# Patient Record
Sex: Female | Born: 1935 | Race: White | Hispanic: No | Smoking: Never smoker
Health system: Southern US, Community
[De-identification: ages and names within clinical notes are randomized; demographics above are authoritative.]

## PROBLEM LIST (undated history)

## (undated) DIAGNOSIS — C439 Malignant melanoma of skin, unspecified: Secondary | ICD-10-CM

## (undated) DIAGNOSIS — K219 Gastro-esophageal reflux disease without esophagitis: Secondary | ICD-10-CM

## (undated) DIAGNOSIS — C50919 Malignant neoplasm of unspecified site of unspecified female breast: Secondary | ICD-10-CM

## (undated) DIAGNOSIS — I10 Essential (primary) hypertension: Secondary | ICD-10-CM

## (undated) DIAGNOSIS — E039 Hypothyroidism, unspecified: Secondary | ICD-10-CM

## (undated) HISTORY — PX: HIP SURGERY: SHX245

## (undated) HISTORY — PX: JOINT REPLACEMENT: SHX530

## (undated) HISTORY — PX: OTHER SURGICAL HISTORY: SHX169

## (undated) HISTORY — PX: EYE SURGERY: SHX253

---

## 2005-02-05 ENCOUNTER — Ambulatory Visit: Payer: Self-pay | Admitting: Family Medicine

## 2005-02-11 ENCOUNTER — Ambulatory Visit: Payer: Self-pay | Admitting: Family Medicine

## 2005-07-17 ENCOUNTER — Ambulatory Visit: Payer: Self-pay | Admitting: Family Medicine

## 2005-07-23 ENCOUNTER — Encounter: Admission: RE | Admit: 2005-07-23 | Discharge: 2005-07-23 | Payer: Self-pay | Admitting: Family Medicine

## 2005-07-24 ENCOUNTER — Ambulatory Visit: Payer: Self-pay | Admitting: Family Medicine

## 2006-02-22 ENCOUNTER — Ambulatory Visit: Payer: Self-pay | Admitting: Family Medicine

## 2006-03-01 ENCOUNTER — Ambulatory Visit: Payer: Self-pay | Admitting: Family Medicine

## 2006-03-02 ENCOUNTER — Ambulatory Visit: Payer: Self-pay | Admitting: Family Medicine

## 2006-03-15 ENCOUNTER — Encounter: Admission: RE | Admit: 2006-03-15 | Discharge: 2006-03-15 | Payer: Self-pay | Admitting: Orthopedic Surgery

## 2006-03-17 ENCOUNTER — Inpatient Hospital Stay (HOSPITAL_COMMUNITY): Admission: RE | Admit: 2006-03-17 | Discharge: 2006-03-20 | Payer: Self-pay | Admitting: Orthopedic Surgery

## 2006-06-20 ENCOUNTER — Inpatient Hospital Stay (HOSPITAL_COMMUNITY): Admission: EM | Admit: 2006-06-20 | Discharge: 2006-07-15 | Payer: Self-pay | Admitting: Emergency Medicine

## 2006-06-21 ENCOUNTER — Ambulatory Visit: Payer: Self-pay | Admitting: Internal Medicine

## 2006-06-23 ENCOUNTER — Ambulatory Visit: Payer: Self-pay | Admitting: Infectious Diseases

## 2006-07-02 ENCOUNTER — Encounter (INDEPENDENT_AMBULATORY_CARE_PROVIDER_SITE_OTHER): Payer: Self-pay | Admitting: *Deleted

## 2006-07-05 ENCOUNTER — Ambulatory Visit: Payer: Self-pay | Admitting: Gastroenterology

## 2006-07-27 ENCOUNTER — Ambulatory Visit (HOSPITAL_COMMUNITY): Admission: RE | Admit: 2006-07-27 | Discharge: 2006-07-27 | Payer: Self-pay | Admitting: Family Medicine

## 2006-07-30 ENCOUNTER — Ambulatory Visit: Payer: Self-pay | Admitting: Family Medicine

## 2006-07-30 LAB — CONVERTED CEMR LAB
BUN: 19 mg/dL (ref 6–23)
Eosinophils Relative: 1.9 % (ref 0.0–5.0)
Ferritin: 502.4 ng/mL — ABNORMAL HIGH (ref 10.0–291.0)
Glucose, Bld: 102 mg/dL — ABNORMAL HIGH (ref 70–99)
HCT: 33 % — ABNORMAL LOW (ref 36.0–46.0)
Hemoglobin: 11.2 g/dL — ABNORMAL LOW (ref 12.0–15.0)
Iron: 27 ug/dL — ABNORMAL LOW (ref 42–145)
Monocytes Absolute: 0.8 10*3/uL — ABNORMAL HIGH (ref 0.2–0.7)
Monocytes Relative: 6.4 % (ref 3.0–11.0)
Neutro Abs: 10.3 10*3/uL — ABNORMAL HIGH (ref 1.4–7.7)
Platelets: 567 10*3/uL — ABNORMAL HIGH (ref 150–400)
Potassium: 4.3 meq/L (ref 3.5–5.1)
RBC: 4.12 M/uL (ref 3.87–5.11)
RDW: 14.4 % (ref 11.5–14.6)
Transferrin: 198 mg/dL — ABNORMAL LOW (ref >=212.0)

## 2006-08-16 ENCOUNTER — Ambulatory Visit: Payer: Self-pay | Admitting: Internal Medicine

## 2006-08-23 ENCOUNTER — Encounter: Admission: RE | Admit: 2006-08-23 | Discharge: 2006-08-23 | Payer: Self-pay | Admitting: Internal Medicine

## 2006-09-23 ENCOUNTER — Encounter: Payer: Self-pay | Admitting: Infectious Diseases

## 2006-11-26 ENCOUNTER — Ambulatory Visit: Payer: Self-pay | Admitting: Infectious Diseases

## 2006-11-26 DIAGNOSIS — K750 Abscess of liver: Secondary | ICD-10-CM | POA: Insufficient documentation

## 2006-11-26 DIAGNOSIS — F411 Generalized anxiety disorder: Secondary | ICD-10-CM | POA: Insufficient documentation

## 2006-11-26 DIAGNOSIS — E785 Hyperlipidemia, unspecified: Secondary | ICD-10-CM | POA: Insufficient documentation

## 2006-11-26 DIAGNOSIS — M1990 Unspecified osteoarthritis, unspecified site: Secondary | ICD-10-CM | POA: Insufficient documentation

## 2006-11-26 DIAGNOSIS — I10 Essential (primary) hypertension: Secondary | ICD-10-CM | POA: Insufficient documentation

## 2010-10-17 NOTE — Op Note (Signed)
NAME:  Danielle, [name]               ACCOUNT NO.:  [id number]   MEDICAL RECORD NO.:  [id number]          PATIENT TYPE:  INP   LOCATION:  0006                         FACILITY:  [facility]   PHYSICIAN:  Ollen Gross, M.D.    DATE OF BIRTH:  1926-01-12   DATE OF PROCEDURE:  03/18/2007  DATE OF DISCHARGE:                                 OPERATIVE REPORT   PREOPERATIVE DIAGNOSIS:  Osteoarthritis left hip.   POSTOPERATIVE DIAGNOSIS:  Osteoarthritis left hip.   PROCEDURE:  Left total hip arthroplasty.   SURGEON:  Ollen Gross, M.D.   ASSISTANT:  Alexzandrew L. Julien Girt, P.A.   ANESTHESIA:  General.   ESTIMATED BLOOD LOSS:  300.   DRAINS:  Hemovac x1.   COMPLICATIONS:  None.  Condition stable to recovery.   CLINICAL NOTE:  Danielle [name] is a 77 year old female with severe end-stage  arthritis of the left hip with intractable pain.  She presents for left  total hip arthroplasty.   PROCEDURE IN DETAIL:  After the successful administration of general  anesthetic, the patient was placed in the right lateral decubitus position  with the left side up and held with the hip positioner.  The left lower  extremity was isolated from the peroneal with plastic drapes and prepped and  draped in the usual sterile fashion.  A short posterolateral incision was  made with the 10 blade through subcutaneous tissue, to the level of the  fascia lata -- which was incised in line with the skin incision.  The  sciatic nerve was palpated and protected, and the short external rotators  isolated off of the femur.  Capsulectomy was performed and the hip was  dislocated.  The center of the femoral head was marked and a trial  prosthesis placed at the center of the trial head; corresponds to the center  of native femoral head.  Osteotomy lines marked on the femoral neck and  osteotomy made with an oscillating saw.  Femoral head was then removed and  the femur retracted anteriorly to gain acetabular exposure.   Acetabular retractors were placed.  The patient had a huge circumferential  osteophyte which was removed.  Acetabular reaming began at 43 mm coursing  increments of 2-53 mm; and then a 54 mm Pinnacle acetabular shell was placed  in anatomic position with outstanding purchase.  We then placed the trial 54  mm neutral +4 liner.   On the femoral side, we prepared with the canal finder and irrigation.  Then  used a lateralizing reamer.  Then broached, starting at size 1 all the way  up to a size 3.  Given the large canal, I felt that if the size 3 fully  seated we would be able to get a size 4 Summit cemented stem in easily.  We  put the size 4 high offset neck for a trial off of the 3 broach.  A 32-plus  one was placed first.  This reduced easily, so went to a 32-plus 5; and she  had great soft tissue tension.  She also had great stability with full  extension, flexion  and rotation (70 degrees flexion, 40 degrees adduction,  90 degrees internal rotation, and 90 degrees of flexion and 70 degrees of  internal rotation).  By placing the left leg on top of the right, I felt  that the lengths were equal.  The hip was then dislocated and all trials  were removed.  The permanent apex hole eliminator was placed, and the  permanent 32 mm neutral Plus-4 Marathon liner was placed into the 54 mm  acetabular shell.  We then thoroughly irrigated the femoral canal with  pulsatile lavage, using the bottle brush.  While this was occurring, we were  mixing the cement.  I had previously placed a size 4 cement restricter at  the appropriate depth in the femoral canal.  Once the cement was ready for  implantation, we thoroughly dried the femoral canal and then injected the  cement at this canal and pressurized it.  The size 4 Summit cemented stem  was then placed at about 15-20 degrees of anteversion, matching her native  version.  All extruded cement was removed.  Once cement was fully hardened,  then the  permanent 32 plus 5 head was placed and the hip was reduced to the  same stability parameters.  The wound was copiously irrigated with saline  solution and short rotators reattached to the femur through drill holes.  Fascia lata was closed over a Hemovac drain with interrupted #1 Vicryl, and  then and subcutaneous  closed with #1 and #2-0 Vicryl; and subcuticular  running 4-0 Monocryl.  The incisions cleaned and dried and Steri-Strips  applied.  The drains hooked to suction.  A bulky sterile dressing placed.  Left lower extremity was then placed into a knee immobilizer, and she was  awakened and transferred to recovery in stable condition.      Ollen Gross, M.D.  Electronically Signed     FA/MEDQ  D:  03/17/2006  T:  03/18/2006  Job:  [id number]

## 2010-10-17 NOTE — Consult Note (Signed)
NAME:  Danielle [name], Danielle [name]               ACCOUNT NO.:  [id number]   MEDICAL RECORD NO.:  [id number]          PATIENT TYPE:  INP   LOCATION:  1621                         FACILITY:  [facility]   PHYSICIAN:  Angelia Mould. Derrell Lolling, M.D.DATE OF BIRTH:  09-10-1925   DATE OF CONSULTATION:  06/30/2006  DATE OF DISCHARGE:                                 CONSULTATION   REASON FOR CONSULTATION:  Evaluate multiple hepatic and perihepatic  abscesses.   HISTORY OF PRESENT ILLNESS:  This is a 77 year old white female who  underwent an uneventful hip replacement in October2007.  For the past 6  weeks she has been anorexic and for the past 2 weeks she has had nausea,  some vomiting, some fever, and change in bowel habits with nonbloody  diarrhea.  She came to the [facility] and was  admitted by Dr. Darryll Capers on January20,2007.  At that time, white  blood cell count was 29,000 with a left shift and imaging revealed liver  lesions that were thought to be consistent with abscesses.   The patient denies any history of gallbladder disease, diverticulitis,  Crohn's disease or other GI disorders.  Following her admission, she was  placed on IV antibiotics and has been followed by Dr. Darlina Sicilian of the  infectious disease service and Sandrea Hughs of the pulmonary service.  She has remained stable and actually is feeling better.   On January21,2008, she was taken to interventional radiology and they  placed a drain in a complex left lobe intrahepatic abscess and also  placed a drain in the right lateral subhepatic abscess.  She had a  subphrenic fluid collection that was hoped would communicate with the  right subhepatic space.  Cultures have grown Bacteroides fragilis and  Peptostreptococcus and she is on IV Zosyn which should cover both.   She states that she is feeling better.  Her fevers have resolved but she  still has some right upper quadrant pressure discomfort.   A follow-up CT scan  was performed on January29,2008, yesterday, and this  shows a adequate decompression of the drained areas in the left hepatic  lobe and in the right lateral subhepatic space, however, the right  subphrenic fluid collection remains very significant in size and  interventional radiology is reluctant to approach this for fear of  injury and contamination of the pleura or the colon.  I have discussed  this with Dr. Kearney Hard today.  He did not think this could be drained  percutaneously. On review of the CT scan, there is no apparent source of  the infection identified, specifically the gallbladder just looks  distended.  There is no small bowel or large bowel disease identified.  There is no pelvic abscess.  I was asked to see the patient to see if  surgical drainage was in order.   PAST HISTORY:  1. She had a total hip replacement.  2. She has hypertension.  3. She has hyperlipidemia.  4. Osteoporosis.  5. Osteoarthritis.  6. Vertigo.   MEDICATIONS ON ADMISSION:  Verapamil, aspirin, enalapril, Fosamax,  calcium, Lipitor, Maxzide.  DRUG ALLERGIES:  NONE KNOWN.   SOCIAL HISTORY:  She is a widow, nonsmoker.  No alcohol.   FAMILY HISTORY:  Her father died at age 10.   REVIEW OF SYSTEMS:  All systems reviewed.  They are noncontributory  except as listed above.   PHYSICAL EXAMINATION:  GENERAL APPEARANCE:  Pleasant older woman who is  alert, appears chronically ill, but not in any acute distress.  She is  alert and oriented, coherent.  She looks somewhat deconditioned.  VITAL SIGNS:  Temperature is 98.3, heart rate 80 and regular,  respiratory 20 and unlabored, oxygen saturation 91% on room, air blood  pressure 140/63.  NECK:  No adenopathy or mass.  No jugular venous distension.  LUNGS:  Clear to auscultation, somewhat decreased breath sounds at the  bases but no wheezes or rhonchi.  HEART:  Regular rate and rhythm.  ABDOMEN:  There is a drain in the epigastrium and a drain in the  right  flank draining small amounts of serosanguineous fluid.  The abdomen does  reveal a tender fullness in the right upper quadrant.  It is otherwise  soft without any masses.   LABORATORY DATA:  White blood cell count has gone down to 13,000 from a  high of 29,000, hemoglobin is 8.6 on June 28, 2006.  She has not had  a prothrombin time in 10 days, that will be repeated.  Her electrolytes  were normal on January28, 2008.  The last liver function tests were on  January22, 2008, and revealed a bilirubin of 1.7, alkaline phosphatase  142, these will be repeated.  She also had a right pleural effusion that  underwent right thoracentesis yesterday.   ASSESSMENT:  1. Left hepatic abscess, status post percutaneous drain.  2. Right subhepatic abscess, status post percutaneous drain.  3. Right subphrenic abscess, inadequately drained.  4. Bilateral pleural effusion, status post recent right pleural      thoracentesis.  5. Stable clinical course.   PLAN:  1. I had a long discussion with the patient and her daughter about      options for intervention.  2. Since her clinical course has stabilized and her white blood cell      count has come down, we will hold off on a surgical decision until      tomorrow.  We are going to check lab work and another CT scan      tomorrow.   If the right subphrenic fluid collection persists, and if radiology is  unable to drain this percutaneously, then she will likely need an  exploratory laparotomy and surgical drainage of these areas.  We would  also examine alimentary tract at that time to see if there is a source.  Certainly she may have to have her gallbladder removed.   Because of her deconditioned state, I advised starting hyperalimentation  and the patient and her daughter declined at this time, wanting to see  if she would eat better today.  I will follow-up tomorrow.     Angelia Mould. Derrell Lolling, M.D.  Electronically Signed     HMI/MEDQ   D:  06/30/2006  T:  06/30/2006  Job:  [id number]   cc:   Fransisco Hertz, M.D.  Fax: [phone]   Charlaine Dalton. Sherene Sires, MD, FCCP  [address]  [place] [address]

## 2010-10-17 NOTE — H&P (Signed)
NAME:  Danielle [name], Danielle [name]               ACCOUNT NO.:  [id number]   MEDICAL RECORD NO.:  [id number]          PATIENT TYPE:  INP   LOCATION:  1503                         FACILITY:  [facility]   PHYSICIAN:  Ollen Gross, M.D.    DATE OF BIRTH:  December 10, 1925   DATE OF ADMISSION:  03/17/2006  DATE OF DISCHARGE:                                HISTORY & PHYSICAL   Date of office visit, history and physical:  March 09, 2006.   CHIEF COMPLAINT:  Left hip pain.   HISTORY OF PRESENT ILLNESS:  The patient is a 77 year old female who has  been seen by Dr. Lequita Halt for a longstanding history of left hip pain.  Problems date back to about 20-30 years ago when she had a cyst collapse,  had to have bone-grafting.  She moved to [place] from [place] two years  ago and has had progressive problems over the past couple of years.  She is  seen in the office.  Her x-rays show severe end-stage arthritis with bone on  bone throughout the hip with some slight protrusio deformity.  It is felt  she has reached the point where she would benefit in undergoing hip  replacement, risks and benefits discussed.  The patient is subsequently  admitted to the hospital.   ALLERGIES:  No known drug allergies.   CURRENT MEDICATIONS:  1. Verapamil ER 240 mg.  2. Triamterine/hydrochlorothiazide 37.5/25 mg.  3. Enalapril 20 mg.  4. Aspirin 81 mg.  5. Fosamax weekly.  6. Lipitor 20 mg.  7. Vitamin B.   PAST MEDICAL HISTORY:  1. History of vertigo.  2. Hypercholesterolemia.  3. Hypertension.  4. History of motion sickness.  5. Osteoporosis.  6. Osteoarthritis.   PAST SURGICAL HISTORY:  Bone-graft, left hip.  Also varicose vein, left leg,  surgery.   SOCIAL HISTORY:  A widow.  Nonsmoker, no alcohol.  Three children.  Lives  alone.   FAMILY HISTORY:  Father deceased at age 45.  Hypertension runs on her  father's side.  Mother is deceased at age 67.   REVIEW OF SYSTEMS:  GENERAL:  No fevers, chills, or night  sweats.  NEUROLOGIC:  Does have a history of vertigo, which is seldom.  No seizure,  syncope or paralysis.  RESPIRATORY:  No shortness of breath, productive  cough or hemoptysis.  CARDIOVASCULAR:  No chest pain, angina or orthopnea.  GI:  Does have a sensitive stomach secondary to motion sickness.  No  nausea, vomiting, diarrhea or constipation.  GU:  No dysuria, hematuria or  discharge.  MUSCULOSKELETAL:  Left hip.   PHYSICAL EXAMINATION:  VITAL SIGNS:  Pulse 68, respirations 12, blood  pressure 122/48.  GENERAL:  A 77 year old white female, well-nourished, well-developed, in no  acute distress.  Very pleasant at the time of my exam.  She is a good  historian.  Alert and oriented and cooperative.  HEENT:  Normocephalic, atraumatic.  Pupils equal, round and reactive.  Oropharynx clear.  EOMs intact.  NECK:  Supple.  CHEST:  Clear anterior and posterior chest walls.  CARDIAC:  Regular rate and rhythm,  no murmurs.  ABDOMEN:  Soft, nontender, bowel sounds present.  RECTAL, BREASTS, GENITALIA:  Not done, not pertinent to present illness.  EXTREMITIES:  Left hip:  The left hip shows flexion to 90 degrees.  There is  0 internal rotation, 0 external rotation, about 10 degrees abduction,  antalgic gait.   IMPRESSION:  1. Osteoarthritis, left hip.  2. History of vertigo.  3. Hypercholesterolemia.  4. Hypertension.  5. History of motion sickness.  6. Osteoporosis.  7. Osteoarthritis.   PLAN:  The patient will be admitted to [facility] to undergo a  left total hip arthroplasty.  Surgery will be performed by Dr. Ollen Gross.      Alexzandrew L. Julien Girt, P.A.      Ollen Gross, M.D.  Electronically Signed    ALP/MEDQ  D:  03/16/2006  T:  03/18/2006  Job:  [id number]   cc:   Loreen Freud, M.D.  [address]  [place] [address]

## 2010-10-17 NOTE — Consult Note (Signed)
NAME:  Danielle [name], Danielle [name] NO.:  [id number]   MEDICAL RECORD NO.:  [id number]          PATIENT TYPE:  INP   LOCATION:  1621                         FACILITY:  [facility]   PHYSICIAN:  Antonietta Breach, M.D.  DATE OF BIRTH:  1925/10/14   DATE OF CONSULTATION:  07/05/2006  DATE OF DISCHARGE:  07/15/2006                                 CONSULTATION   REQUESTING PHYSICIAN:  Stacie Glaze, MD.   REASON FOR CONSULTATION:  Depression   HISTORY OF PRESENT ILLNESS:  Danielle [name] is a 77 year old female  admitted to the [facility] on June 20, 2006.   The patient was experiencing some initial depressed mood and was  shedding some tears regarding her stress.   The patient has had hepatic and perihepatic abscesses. She also in the  past 6 months has undergone hip replacement. After the hip replacement,  she developed abscesses. She has had decreased energy.   She denies any anhedonia or thoughts of hopelessness.  She denies  thoughts of harming herself.  She does maintain current interests. She  has no hallucinations or delusions. She was just started on Lexapro at  10 mg q.a.m., and she is not having any adverse side effects.  She has  normal orientation and memory ability.   The patient's onset of crying was approximately 3 days ago. She states  that she was facing a convergence of these multiple stresses and, after  expressing some mild despair, she is now feeling better.   PAST PSYCHIATRIC HISTORY:  No history of major depression.  No history  of mania.  No history of hallucinations or delusions.  The patient has  no history of psychotropic medication.   FAMILY PSYCHIATRIC HISTORY:  None known.   SOCIAL HISTORY:  The patient's husband died of cancer the age of 17.  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 Her son and daughter live  locally.  Therefore, she moved down to the area to live near them.  Her  religion is Air traffic [employer].  She does not use any  alcohol or illegal drugs.   PAST MEDICAL HISTORY:  1. Hypercholesterolemia.  2. Hypertension.  3. Motion sickness.  4. Osteoporosis.  5. Osteoarthritis.   MEDICATIONS:  The MAR is reviewed.  The patient is on Lexapro 10 mg  daily.   ALLERGIES:  No known drug allergies.   LABORATORY DATA:  WBC 8.1, hemoglobin 11.1, platelet count 480.  Comprehensive metabolic panel is unremarkable.  The BUN is 5, creatinine  0.73, SGOT 28, SGPT 30.  Folic acid within normal limits.  B12 is more  than adequate   REVIEW OF SYSTEMS:  CONSTITUTIONAL:  Afebrile.  No weight loss.  HEAD:  No trauma.  EYES:  No visual changes.  EARS:  No hearing impairment.  NOSE:  No rhinorrhea.  MOUTH/THROAT:  No sore throat.  NEUROLOGIC:  Unremarkable.  PSYCHIATRIC:  As above.  CARDIOVASCULAR:  No chest pain,  palpitations.  RESPIRATORY:  No coughing or wheezing.  GASTROINTESTINAL:  As above.  GENITOURINARY:  No dysuria.  SKIN:  Unremarkable.  MUSCULOSKELETAL:  No deformities.  HEMATOLOGIC/LYMPHATIC:  Unremarkable.  ENDOCRINE/METABOLIC:  Unremarkable.   PHYSICAL EXAMINATION:  VITAL SIGNS:  Temperature 97.5, pulse 107,  respiration 20, blood pressure 129/74, O2 saturation on 2 liters 96%.  GENERAL APPEARANCE:  Danielle [name] is an elderly female appearing her  chronologic age, lying in a supine position in her hospital bed with  good eye contact.  She has no abnormal involuntary movements.  She has a  normal habitus.   OTHER MENTAL STATUS EXAM:  Her attention span is within normal limits.  Her concentration is mildly decreased. Orientation is completely intact  to all spheres.  Memory is intact to immediate, recent, remote.  Her  speech involves normal rate and prosody without dysarthria.  Her fund of  knowledge and intelligence are within normal limits.  Thought process:  Logical, coherent, goal directed.  No looseness of associations.  Thought content:  No thoughts of harming herself, no thoughts of harming   others. No delusions, no hallucinations.  Her calculation and  abstraction ability are within normal limits.  Her affect is broad and  appropriate.  Her mood is within normal limits.  Her insight is within  normal limits.  Her judgment is intact.  She is socially appropriate and  cooperative.  She is well groomed   ASSESSMENT:  AXIS I:  (293.83) mood disorder not otherwise specified.  (General medical factors and some short-lived reactive depressive  symptoms now resolved.  The patient's low energy has been due to her  general medical problems.)  AXIS II:  None  AXIS III:  See general medical problems.  AXIS IV:  General medical.  AXIS V:  55.   Danielle [name] is not at risk to harm herself or others.  She agrees to  call emergency services immediately for any thoughts of harming herself,  any thoughts of harming others or distress.  She also agrees to call if  she develops symptoms of major depression.   The undersigned provided ego supportive psychotherapy and education. The  indications, alternatives and adverse effects of Lexapro were discussed  with the patient. She wanted to have the Lexapro stopped. She pointed  out the short duration of her symptoms and the fact that they were few  in number and moderate and have now resolved other than her low energy  due to the general medical condition. She does want the Lexapro stopped  but agrees to reconsider if if a major depressive symptom pattern  develops   RECOMMENDATIONS:  1. Will stop the Lexapro has requested by the patient given that her      symptom pattern is greatly improved as described above.  2. If the patient develops a major depressive pattern, please call      psychiatry.      Antonietta Breach, M.D.  Electronically Signed     JW/MEDQ  D:  12/06/2006  T:  12/06/2006  Job:  [id number]

## 2010-10-17 NOTE — Discharge Summary (Signed)
NAME:  Danielle [name], Danielle [name]               ACCOUNT NO.:  [id number]   MEDICAL RECORD NO.:  [id number]          PATIENT TYPE:  INP   LOCATION:  1503                         [facility]ACILITY[facility]  [facility]   PHYSICIAN:  Ollen Gross, M.D.    DATE OF BIRTH:  03-20-1926   DATE OF ADMISSION:  03/17/2006  DATE OF DISCHARGE:  03/20/2006                                 DISCHARGE SUMMARY   ADMISSION DIAGNOSES:  1. Osteoarthritis, left hip.  2. History of vertigo.  3. Hypercholesterolemia.  4. Hypertension.  5. History of motion sickness.  6. Osteoporosis secondary to osteoarthritis.   DISCHARGE DIAGNOSES:  1. Osteoarthritis, left hip, status post left total hip arthroplasty.  2. Acute blood loss anemia, did not require transfusion.  3. Postoperative hypokalemia, improved.   PROCEDURE:  On April 17, 2006, left total hip surgery, Dr. Lequita Halt,  assistant Alexzandrew L. Perkins, P.A.-C.  Anesthesia:  General.   CONSULTATIONS:  None.   BRIEF HISTORY:  Ms. Trevathan is a 77 year old female with severe end-stage  arthritis of the left hip, who now presents for a total hip arthroplasty.   LABORATORY DATA:  Preop CBC showed a hemoglobin of 12.2, hematocrit of 35.5,  white cell count 6.2.  Chemistry panel on an outpatient basis all within  normal limits.  PT/INR preop 12.1 and 1.0.  Hemoglobin did drop to 9.1, last  noted H&H 8.3 and 23.9.  Serial pro times were followed, last noted PT/INR  of 17.6, 1.4.  Serial BMETs were followed postop.  Potassium did drop down  to 3.2.  Potassium supplements, came back up to 3.5.  Preop UA:  Trace  leukocyte esterase, few epithelial cells, 0-2 white cells.  Blood group type  O positive.   EKG dated February 22, 2006:  Sinus rhythm, borderline first degree AV  block, confirmed, unable to read signature.  Two-view chest March 11, 2006:  Left upper lobe density which could be overlapping blood vessel.  Unless prior studies, would suggest CT scan.  Preop hip films:   Degenerative  changes, left hip.  CT scan dated March 15, 2006:  No evidence of left  upper lobe nodule.  Appearance was reduced by a summation shadow.   HOSPITAL COURSE:  The patient was admitted to [facility] and  tolerated the procedure well, later sent to the recovery room and the  orthopedic floor.  Started on PCA and p.o. pain control following surgery  actually doing very well after surgery but was sick on the morning of day  #1, had some vomiting treated symptomatically.  The Hemovac drain placed  from the surgery was pulled.  Hemoglobin was down a little bit.  Placed on  iron supplements.  By day #2 still having a little bit of intermittent  nausea but was improving.  Encouraged mobility.  Had a drop in potassium,  was given potassium supplements.  Nausea was getting a little bit better.  She actually did well with therapy, doing 50 and 80 feet with ambulation.  Dressing change.  Incision looked good.  She continued to progress well.  Nausea was resolving by  the following day of March 20, 2006, progressing  well, and was discharged home.   DISCHARGE PLAN:  The patient was discharged home on March 20, 2006.   DISCHARGE DIAGNOSES:  Please see above.   DISCHARGE MEDICATIONS:  Coumadin, Darvocet, Robaxin, Nu-Iron.   DIET:  As tolerated.   ACTIVITY:  Partial weightbearing 25-50% left lower extremity.  Home health  PT and home health nursing.  Total hip protocol.   FOLLOW-UP:  2 weeks.   DISPOSITION:  Home.   CONDITION ON DISCHARGE:  Improved.      Alexzandrew L. Julien Girt, P.A.      Ollen Gross, M.D.  Electronically Signed    ALP/MEDQ  D:  04/09/2006  T:  04/09/2006  Job:  [id number]   cc:   Loreen Freud, M.D.  [address] [place]  [place] [address]

## 2010-10-17 NOTE — Assessment & Plan Note (Signed)
[facility]                                 ON-CALL NOTE   Danielle, [name]                        MRN:          [id number]  DATE:07/17/2006                            DOB:          Dec 19, 1925    Caller is Danielle [name].  Phone number:  [phone].   SUBJECTIVE:  Ms. [name] states that her mother was in the hospital for 3-  1/2 weeks until she was discharged on Thursday.  She was supposed to  have home health care set up for deconditioning, decubitus ulcer care,  physical therapy, occupational therapy and skilled nursing evaluation.  She states that no one showed up to provide this care.  The care was  supposed to come from [facility]   ASSESSMENT AND PLAN:  I contacted the care manager at [facility], and she is contacting [facility] to reinstate any  orders that may have been inadvertently cancelled.  Of note, the  daughter stated that when she spoke the [place], the orders had been  cancelled.  I instructed the care manager to call me back if things are  unable to be set up, but hopefully we can get things set up by tomorrow.  I then returned a phone call to [facility] [name], [name], and informed her of the above plan.  She will call me if she  has any further problems.     Kerby Nora, MD  Electronically Signed    AB/MedQ  DD: 07/17/2006  DT: 07/17/2006  Job #: [id number]

## 2010-10-17 NOTE — Discharge Summary (Signed)
NAME:  Danielle [name], Danielle [name]               ACCOUNT NO.:  [id number]   MEDICAL RECORD NO.:  [id number]          PATIENT TYPE:  INP   LOCATION:  1621                         FACILITY:  [facility]   PHYSICIAN:  Rosalyn Gess. Norins, MD  DATE OF BIRTH:  1925-06-12   DATE OF ADMISSION:  06/20/2006  DATE OF DISCHARGE:  07/15/2006                               DISCHARGE SUMMARY   ADMITTING DIAGNOSES:  1. Abdominal pain with fever.  2. Hypertension.  3. Osteoarthritis.  4. Hyperlipidemia.   DISCHARGE DIAGNOSES:  1. 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  2. Hypertension.  3. Osteoarthritis.  4. Hyperlipidemia.  5. Anxiety.   CONSULTANTS:  1. Dr. Claud Kelp for general surgery.  2. Dr. [facility] for infectious disease.  3. Dr. Irish Lack for interventional radiology.   PROCEDURES:  1. CT of the abdomen June 24, 2006 showing increased low density      fluid in the right subphrenic and perihepatic space consistent with      abscess.  2. Chest x-ray June 24, 2006.  PICC line right atrium, bibasilar      atelectasis.  3. CT-guided abscess drainage June 25, 2006 by Dr. Fredia Sorrow with      placement of subhepatic brain, subphrenic drain.  4. Ultrasound-guided thoracentesis June 29, 2006 with 600 mL of      slightly turbid amber fluid.  5. CT abdomen and pelvis January 29 revealing decrease in infrahepatic      collection of abscess fluid with little change in the moderate      right subphrenic, collection some pelvic ascites was noted.  6. CT abdomen and pelvis January 31 revealing stable right subphrenic      abscess, marginal increase in anterior right hepatic abscess.      Increased bilateral pleural effusions, stable pelvic ascites.  7. CT-guided abscess drainage July 01, 2006 read as successful      subphrenic and subcapsular right liver abscess drainage by      aspiration with drain left in place.  8. CT abdomen and pelvis July 06, 2006 with bilateral effusions,      bibasilar atelectasis.  Drains were in place and stable for a total      of 3 drains at this point, complex cystic area in the liver      posteriorly without a drain was felt to be stable, the pelvis with      interval change.  9. CT abdomen February 10 showed improvement of level of all 3      indwelling cutaneous drains with less subphrenic fluid present in      that abscess.  10.CT abdomen July 14, 2006 showing decrease in size of residual      abscesses in the right lobe of liver, no new drains placed,      previous drains removed.   HISTORY OF PRESENT ILLNESS:  The patient is a 77 year old woman who had  had a viral illness diagnosed by her primary MD who presented to the  emergency department with fever and abdominal pain, mild nausea with  vomiting.  In the ER she was found have a white count of 29,900 with a  left shift and ultrasound revealed the patient to have liver lesions  that were consistent with abscess.  The patient's states she had a 1-  week history of severe abdominal discomfort with diarrhea, nausea and  vomiting x1.  The patient was subsequently admitted for treatment of  hepatic abscess.   PAST MEDICAL HISTORY:  Significant for total hip replacement,  hypertension, hyperlipidemia, osteoporosis, osteoarthritis, history of  vertigo.   MEDICATIONS ON ADMISSION:  1. Verapamil ER 240 mg daily.  2. Enalapril 20 mg daily.  3. Fosamax 70 mg weekly.  4. Calcium.  5. Lipitor 20 mg daily.  6. Maxzide 37.5/25.   The patient has no known drug allergies.   FAMILY HISTORY:  Noncontributory.   SOCIAL HISTORY:  The patient is a widow.  She is a nonsmoker and  nondrinker. She has a daughter who lives in town who works for the  hospital system.  She has had no out of area travel or exposure to  toxins.   HOSPITAL COURSE:  ID/liver abscess.  The patient was admitted with  multiple liver abscesses.  She was seen in consultation  by the ID  service.  She also had been seen by interventional radiology with drain  placements.  The patient was treated with Zosyn initially.  She was  followed with serial CT scans as noted.  Lab work was followed serially  as noted.  The process was very slow moving with slow resolution of her  infection. Cultures did grow out Peptostreptococcus growing from mixed  floral abscess with routine sensitivities to penicillin.  Because of her  prolonged stay and need for IV antibiotics, a PICC line was placed  without incident.  The patient continued to do well.  The final flora  was B fragilis and Peptostreptococcus which common liver abscess  organisms per Dr. Maurice March. The patient also underwent diagnostic  thoracentesis to be sure she did not develop an empyema and it was felt  actually she had a sympathetic effusion that did not require any  additional drainage or treatment.  She had no respiratory compromise  from her pleural effusion.   The patient continued to make a slow and steady recovery.  By the 13th,  final CT showed that there was no significant residual abscess requiring  any additional drainage.  ID had recommended that once the patient's  drains were removed and once she was felt to be stable that she would be  able to complete a home regimen with oral antibiotics.  Of note, the  patient had been changed from Zosyn to Primaxin but there had been no  significant change in the patient's condition.  With the patient having  improved with her drains being removed, with her fever remaining down,  with her white count remaining normal, with her pain having been  resolved, the patient was felt to be stable and ready for discharge on  oral antibiotics for an additional 10 days.   Of note, the patient had significant pain requiring management with IV  and oral narcotics but for the 2 days prior to discharge she was relying on Tylenol only once her drains were removed.   The  patient's other medical problems include mild anemia which is  chronic and will be treated with iron therapy.  Her blood pressure  remained stable.   DISCHARGE EXAMINATION:  VITAL SIGNS:  Temperature was 98.6, blood  pressure 133/66, pulse was 88, respirations 20, O2 sats 94%.  GENERAL APPEARANCE:  A pleasant woman who looks her stated age lying in  bed.  She is in no distress.  HEENT:  Exam was unremarkable.  CHEST:  The patient is moving air well with no increased work of  breathing.  No audible wheezes.  CARDIOVASCULAR:  2+ radial pulse, her precordium was quiet.  She had a  regular rate and rhythm.  No murmurs were appreciated.  ABDOMEN:  Bowel sounds were positive in all four quadrants.  The patient  had no guarding or rebound, no tenderness.  She has several bandages in  place over drain sites and these bandages were not removed.  The patient  had only minimal tenderness.  EXTREMITIES:  Without clubbing, cyanosis, no edema.  No deformities were  noted.  DERM:  The patient does have mild erythema in glabellar, infraorbital  and chin distribution suggestive of possible mild rosacea. No other skin  lesions were noted.   FINAL LABORATORY:  From the 13th hemoglobin was 10.9 grams, white count  was 7200, platelet count 280,000.  Chemistries with a sodium of 133,  potassium 3.8, chloride 100, CO2 of 26, BUN of 8, creatinine 0.42,  glucose was 103. INR was 1.0. The patient had a folic acid level checked  January 30 which was normal at 11.7.  B12 was checked January 30 and was  normal at 1076. Total iron was checked January 30 showing an iron of 12  with a percent iron saturation is 6%, total iron binding capacity was  187. Total fluid analysis revealed normal amylase, normal LDH, glucose  of 73, total protein less than 3, all consistent with benign effusion  with a cell count of 280 WBC.   Final micro abscess culture with Peptostreptococcus species on January  21, cultures on January  20 blood cultures were negative. Culture of  abscess fluid from February 1 was no growth.   DISCHARGE MEDICATIONS:  The patient will resume her home medications  including:  1. Maxzide 37.5/25 once daily.  2. Enalapril 5 mg daily.  3. Verapamil 240 mg daily.  4. Tylenol as needed for pain.  5. Colace 100 mg b.i.d. as needed.  6. I would continue the proton pump inhibitor with Protonix 40 mg      q.a.m. until seen in follow-up.  7. Potassium 20 mEq daily.  8. Levsin SL q.2 h p.r.n. any abdominal spasm.  9. Augmentin 875 mg b.i.d. for 10 additional days.  10.Ferrous gluconate 325 mg b.i.d. until seen in follow-up by her      primary physician.   DISPOSITION:  The patient is discharged home.  She should follow up with  Dr. Loreen Freud, her primary physician, in 7-10 days.   The patient is instructed for any recurrent fever, pain, discomfort, nausea, vomiting or other symptoms, she should call immediately for  reevaluation.   Would recommend the patient have a follow-up CT scan of the abdomen in  10-14 days.   The patient's condition at the time of discharge dictation is stable and  improved.      Rosalyn Gess Norins, MD  Electronically Signed     MEN/MEDQ  D:  07/15/2006  T:  07/15/2006  Job:  [id number]   cc:   Lelon Perla, DO  [address]  [place], [place] [address]   Fransisco Hertz, M.D.  Fax: [phone]   Jodi Marble. Fredia Sorrow, M.D.  Fax: [phone]

## 2012-01-17 ENCOUNTER — Other Ambulatory Visit: Payer: Self-pay | Admitting: Physician Assistant

## 2012-01-17 NOTE — Telephone Encounter (Signed)
Need paper chart  

## 2012-04-21 ENCOUNTER — Other Ambulatory Visit: Payer: Self-pay | Admitting: Physician Assistant

## 2012-04-22 NOTE — Telephone Encounter (Signed)
No paper chart °

## 2012-04-22 NOTE — Telephone Encounter (Signed)
Please pull paper chart.  

## 2013-07-30 DEATH — deceased

## 2015-02-22 ENCOUNTER — Encounter (HOSPITAL_BASED_OUTPATIENT_CLINIC_OR_DEPARTMENT_OTHER): Payer: Self-pay | Admitting: *Deleted

## 2015-02-22 ENCOUNTER — Emergency Department (HOSPITAL_BASED_OUTPATIENT_CLINIC_OR_DEPARTMENT_OTHER): Payer: Medicare HMO

## 2015-02-22 ENCOUNTER — Emergency Department (HOSPITAL_BASED_OUTPATIENT_CLINIC_OR_DEPARTMENT_OTHER)
Admission: EM | Admit: 2015-02-22 | Discharge: 2015-02-22 | Disposition: A | Discharge status: Home / Self Care | Payer: Medicare HMO | Attending: Emergency Medicine | Admitting: Emergency Medicine

## 2015-02-22 DIAGNOSIS — W07XXXA Fall from chair, initial encounter: Secondary | ICD-10-CM | POA: Diagnosis not present

## 2015-02-22 DIAGNOSIS — S4992XA Unspecified injury of left shoulder and upper arm, initial encounter: Secondary | ICD-10-CM | POA: Diagnosis present

## 2015-02-22 DIAGNOSIS — Y9289 Other specified places as the place of occurrence of the external cause: Secondary | ICD-10-CM | POA: Insufficient documentation

## 2015-02-22 DIAGNOSIS — Y9389 Activity, other specified: Secondary | ICD-10-CM | POA: Insufficient documentation

## 2015-02-22 DIAGNOSIS — S42292A Other displaced fracture of upper end of left humerus, initial encounter for closed fracture: Secondary | ICD-10-CM | POA: Insufficient documentation

## 2015-02-22 DIAGNOSIS — Y998 Other external cause status: Secondary | ICD-10-CM | POA: Diagnosis not present

## 2015-02-22 DIAGNOSIS — S42202A Unspecified fracture of upper end of left humerus, initial encounter for closed fracture: Secondary | ICD-10-CM

## 2015-02-22 MED ORDER — HYDROCODONE-ACETAMINOPHEN 5-325 MG PO TABS: 1.0000 | ORAL_TABLET | Freq: Four times a day (QID) | ORAL | Status: DC | PRN

## 2015-02-22 MED ORDER — ONDANSETRON 8 MG PO TBDP
8.0000 mg | ORAL_TABLET | Freq: Once | ORAL | Status: AC
  Administered 2015-02-22: 8 mg via ORAL
  Filled 2015-02-22: qty 1

## 2015-02-22 MED ORDER — LIDOCAINE HCL 2 % IJ SOLN: 10.0000 mL | Freq: Once | INTRAMUSCULAR | Status: DC

## 2015-02-22 MED ORDER — HYDROCODONE-ACETAMINOPHEN 5-325 MG PO TABS
1.0000 | ORAL_TABLET | Freq: Once | ORAL | Status: AC
  Administered 2015-02-22: 1 via ORAL
  Filled 2015-02-22: qty 1

## 2015-02-22 MED ORDER — ONDANSETRON 8 MG PO TBDP: 8.0000 mg | ORAL_TABLET | Freq: Three times a day (TID) | ORAL | Status: DC | PRN

## 2015-02-22 NOTE — ED Notes (Signed)
[address] off a chair while changing a light bulb. Injury to her left shoulder.

## 2015-02-22 NOTE — Discharge Instructions (Signed)
It was our pleasure to provide your ER care today - we hope that you feel better.  Wear sling/shoulder support.  Ice/coldpack to sore area.   You may want to sleep with head/upper body elevated to help with pain.   Take motrin or aleve as need for pain. You may also take hydrocodone as need for pain. No driving for the next 6 hours or when taking hydrocodone. Also, do not take tylenol or acetaminophen containing medication when taking hydrocodone.  You may take zofran as need for nausea.  If taking pain medication, and not as active as normal (due to arm injury), you can become constipated.  If constipated, make sure to drink plenty of fluids, get adequate fiber in diet, and take colace (stool softener), as need.  Follow up with orthopedist in coming week - call their office Monday to arrange appointment time.  Return to ER if worse, new symptoms, intractable pain, numbness/weakness, other concern.     Shoulder Fracture (Proximal Humerus or Glenoid) A shoulder fracture is a broken upper arm bone or a broken socket bone. The humerus is the upper arm bone and the glenoid is the shoulder socket. Proximal means the humerus is broken near the shoulder. Most of the time the bones of a broken shoulder are in an acceptable position. Usually, the injury can be treated with a shoulder immobilizer or sling and swath bandage. These devices support the arm and prevent any shoulder movement. If the bones are not in a good position, then surgery is sometimes needed. Shoulder fractures usually initially cause swelling, pain, and discoloration around the upper arm. They heal in 8 to 12 weeks with proper treatment. SYMPTOMS  At the time of injury:  Pain.  Tenderness.  Regular body contours are not normal. Later symptoms may include:  Swelling and bruising of the elbow and hand.  Swelling and bruising of the arm or chest. Other symptoms include:  Pain when lifting or turning the arm.  Paralysis  below the fracture.  Numbness or coldness below the fracture. CAUSES   Indirect force from falling on an outstretched arm.  A blow to the shoulder. RISK INCREASES WITH:  Not being in shape.  Playing contact sports, such as football, soccer, hockey, or rugby.  Sports where falling on an outstretched arm occurs, such as basketball, skateboarding, or volleyball.  History of bone or joint disease.  History of shoulder injury. PREVENTION  Warm up before activity.  Stretch before activity.  Stay in shape with your:  Heart fitness.  Flexibility.  Shoulder Strength.  Falling with the proper technique. PROGNOSIS  In adults, healing time is about 7 weeks. For children, healing time is about 5 weeks. Surgery may be needed. RELATED COMPLICATIONS  The bones do not heal together (nonunion).  The bones do not align properly when they heal (malunion).  Long-term problems with pain, stiffness, swelling, or loss of motion.  The injured arm heals shorter than the other.  Nerves are injured in the arm.  Arthritis in the shoulder.  Normal bone growth is interrupted in children.  Blood supply to the shoulder joint is diminished. TREATMENT If the bones are aligned, then initial treatment will be with ice and medicine to help with pain. The shoulder will be held in place with a sling (immobilization). The shoulder will be allowed to heal for up to 6 weeks. Injuries that may need surgery include:  Severe fractures.  Fractures that are not in appropriate alignment (displaced).  Non-displaced fractures (not  common). Surgery helps the bones align correctly. The bones may be held in place with:  Sutures.  Wires.  Rods.  Plates.  Screws.  Pins. If you have had surgery or not, you will likely be assisted by a physical therapist or athletic trainer to get the best results with your injured shoulder. This will likely include exercises to strengthen and stretch the injured and  surrounding areas. MEDICATION  If pain medicine is needed, nonsteroidal anti-inflammatory medicines (such as aspirin or ibuprofen) or other minor pain relievers (such as acetaminophen) are often advised.  Do not take pain medicine for 7 days before surgery.  Stronger pain relievers may be prescribed. Use only as directed and take only as much as you need. COLD THERAPY Cold treatment (icing) relieves pain and reduces inflammation. Cold treatment should be applied for 10 to 15 minutes every 2 to 3 hours, and immediately after activity that aggravates your symptoms. Use ice packs or an ice massage. SEEK IMMEDIATE MEDICAL CARE IF:  You have severe shoulder pain unrelieved by rest and taking pain medicine.  You have pain, numbness, tingling, or weakness in the hand or wrist.  You have shortness of breath, chest pain, severe weakness, or fainting.  You have severe pain with motion of the fingers or wrist.  Blue, gray, or dark color appears in the fingernails on injured extremity. Document Released: 05/18/2005 Document Revised: 08/10/2011 Document Reviewed: 08/30/2008 [facility] Patient Information 2015 [place], [place]. This information is not intended to replace advice given to you by your health care provider. Make sure you discuss any questions you have with your health care provider.   Shoulder Immobilizer Your doctor has given you a shoulder immobilizer. This can be used to treat shoulder fractures and dislocations. It keeps the arm supported next to the body, and prevents it from swinging loose and from further injury or pain. Shoulder fractures and dislocations usually take 4-6 weeks to heal. HOME CARE INSTRUCTIONS  To reduce irritation in your armpit, use powder or pads to absorb any sweat.  Your immobilizer may be removed and washed as directed, but do not use your arm for any work out of the immobilizer unless your doctor approves.  Always wear your immobilizer at night.  Call  your doctor if you have any questions about your injury or how to use this device. Document Released: 06/25/2004 Document Revised: 08/10/2011 Document Reviewed: 05/05/2007 [facility] Patient Information 2015 [place], [place]. This information is not intended to replace advice given to you by your health care provider. Make sure you discuss any questions you have with your health care provider.    Cryotherapy Cryotherapy means treatment with cold. Ice or gel packs can be used to reduce both pain and swelling. Ice is the most helpful within the first 24 to 48 hours after an injury or flare-up from overusing a muscle or joint. Sprains, strains, spasms, burning pain, shooting pain, and aches can all be eased with ice. Ice can also be used when recovering from surgery. Ice is effective, has very few side effects, and is safe for most people to use. PRECAUTIONS  Ice is not a safe treatment option for people with:  Raynaud phenomenon. This is a condition affecting small blood vessels in the extremities. Exposure to cold may cause your problems to return.  Cold hypersensitivity. There are many forms of cold hypersensitivity, including:  Cold urticaria. Red, itchy hives appear on the skin when the tissues begin to warm after being iced.  Cold erythema. This  is a red, itchy rash caused by exposure to cold.  Cold hemoglobinuria. Red blood cells break down when the tissues begin to warm after being iced. The hemoglobin that carry oxygen are passed into the urine because they cannot combine with blood proteins fast enough.  Numbness or altered sensitivity in the area being iced. If you have any of the following conditions, do not use ice until you have discussed cryotherapy with your caregiver:  Heart conditions, such as arrhythmia, angina, or chronic heart disease.  High blood pressure.  Healing wounds or open skin in the area being iced.  Current infections.  Rheumatoid arthritis.  Poor  circulation.  Diabetes. Ice slows the blood flow in the region it is applied. This is beneficial when trying to stop inflamed tissues from spreading irritating chemicals to surrounding tissues. However, if you expose your skin to cold temperatures for too long or without the proper protection, you can damage your skin or nerves. Watch for signs of skin damage due to cold. HOME CARE INSTRUCTIONS Follow these tips to use ice and cold packs safely.  Place a dry or damp towel between the ice and skin. A damp towel will cool the skin more quickly, so you may need to shorten the time that the ice is used.  For a more rapid response, add gentle compression to the ice.  Ice for no more than 10 to 20 minutes at a time. The bonier the area you are icing, the less time it will take to get the benefits of ice.  Check your skin after 5 minutes to make sure there are no signs of a poor response to cold or skin damage.  Rest 20 minutes or more between uses.  Once your skin is numb, you can end your treatment. You can test numbness by very lightly touching your skin. The touch should be so light that you do not see the skin dimple from the pressure of your fingertip. When using ice, most people will feel these normal sensations in this order: cold, burning, aching, and numbness.  Do not use ice on someone who cannot communicate their responses to pain, such as small children or people with dementia. HOW TO MAKE AN ICE PACK Ice packs are the most common way to use ice therapy. Other methods include ice massage, ice baths, and cryosprays. Muscle creams that cause a cold, tingly feeling do not offer the same benefits that ice offers and should not be used as a substitute unless recommended by your caregiver. To make an ice pack, do one of the following:  Place crushed ice or a bag of frozen vegetables in a sealable plastic bag. Squeeze out the excess air. Place this bag inside another plastic bag. Slide the bag  into a pillowcase or place a damp towel between your skin and the bag.  Mix 3 parts water with 1 part rubbing alcohol. Freeze the mixture in a sealable plastic bag. When you remove the mixture from the freezer, it will be slushy. Squeeze out the excess air. Place this bag inside another plastic bag. Slide the bag into a pillowcase or place a damp towel between your skin and the bag. SEEK MEDICAL CARE IF:  You develop white spots on your skin. This may give the skin a blotchy (mottled) appearance.  Your skin turns blue or pale.  Your skin becomes waxy or hard.  Your swelling gets worse. MAKE SURE YOU:   Understand these instructions.  Will watch your condition.  Will get help right away if you are not doing well or get worse. Document Released: 01/12/2011 Document Revised: 10/02/2013 Document Reviewed: 01/12/2011 [facility] Patient Information 2015 [place], [place]. This information is not intended to replace advice given to you by your health care provider. Make sure you discuss any questions you have with your health care provider.

## 2015-02-22 NOTE — ED Provider Notes (Signed)
CSN: [id number]     Arrival date & time 02/22/15  1718 History   First MD Initiated Contact with Patient 02/22/15 1722     Chief Complaint  Patient presents with  . Fall     (Consider location/radiation/quality/duration/timing/severity/associated sxs/prior Treatment) Patient is a 81 y.o. female presenting with fall. The history is provided by the patient and a relative.  Fall  Patient s/p injury to left shoulder just pta today.  Patient had climbed up on chair to change light bulb, was coming down at reach/moved awkwardly with left arm, and felt pop and sudden pain in left shoulder. Patient denies any fall.  No hx prior shoulder pain or injury. Pain constant, dull, mod-severe, worse w movement of shoulder. No arm or hand numbness or weakness. Skin intact. Denies any other pain or injury. States otherwise was in normal well state of health.      No past medical history on file. Past Surgical History  Procedure Laterality Date  . Hip surgery     No family history on file. Social History  Substance Use Topics  . Smoking status: Never Smoker   . Smokeless tobacco: None  . Alcohol Use: No   OB History    No data available     Review of Systems  Constitutional: Negative for fever.  Musculoskeletal: Negative for back pain and neck pain.  Skin: Negative for wound.  Neurological: Negative for weakness and numbness.      Allergies  Review of patient's allergies indicates no known allergies.  Home Medications   Prior to Admission medications   Medication Sig Start Date End Date Taking? Authorizing Provider  ENALAPRIL MALEATE PO Take by mouth.   Yes Historical Provider, MD  VERAPAMIL HCL PO Take by mouth.   Yes Historical Provider, MD   BP 166/58 mmHg  Pulse 75  Temp(Src) 98.2 F (36.8 C) (Oral)  Resp 18  Ht 5\' 4"  (1.626 m)  Wt 122 lb (55.339 kg)  BMI 20.93 kg/m2  SpO2 99% Physical Exam  Constitutional: She appears well-developed and well-nourished. No distress.   HENT:  Head: Atraumatic.  Eyes: Conjunctivae are normal. No scleral icterus.  Neck: Neck supple. No tracheal deviation present.  Cardiovascular: Intact distal pulses.   Pulmonary/Chest: Effort normal. No respiratory distress.  Abdominal: Normal appearance.  Musculoskeletal: She exhibits no edema.  Limited rom left shoulder due to pain. Mild sts left upper arm/shoulder. Good rom at wrist and elbow without pain. Skin intact. Radial pulse 2+. LUE r/m/n fxn, motor and sensory, intact.  c spine non tender, normal rom without pain.   Neurological: She is alert.  LUE, r/m/u fxn intact, motor 5/5, sens intact.   Skin: Skin is warm and dry. No rash noted. She is not diaphoretic.  Psychiatric: She has a normal mood and affect.  Nursing note and vitals reviewed.   ED Course  Procedures (including critical care time)  Dg Shoulder Left  02/22/2015   CLINICAL DATA:  The patient's arm popped while getting out of a chair today. Left upper arm pain. Initial encounter.  EXAM: LEFT SHOULDER - 2+ VIEW  COMPARISON:  None.  FINDINGS: The patient has an acute fracture of the proximal left humerus. The fracture is oblique in orientation extending from the lateral cortex just below the level of the surgical neck into the proximal diaphysis. The distal aspect of the fracture is distracted approximately 1 cm. No other acute abnormality is identified.  IMPRESSION: Acute proximal left humerus fracture as described.  Electronically Signed   By: Inge Rise M.D.   On: 02/22/2015 18:05        MDM   Xrays.   Zofran. Vicodin.   Ice. Shoulder immobilizer.  Discussed xrays w pt.  Recheck pt comfortable, and appears stable for d/c.  Family w pt, can assist.      Lajean Saver, MD 02/22/15 [phone]

## 2015-06-05 ENCOUNTER — Other Ambulatory Visit: Payer: Self-pay | Admitting: Surgery

## 2015-06-05 DIAGNOSIS — R2231 Localized swelling, mass and lump, right upper limb: Secondary | ICD-10-CM

## 2015-06-06 ENCOUNTER — Other Ambulatory Visit: Payer: Self-pay | Admitting: Surgery

## 2015-06-06 ENCOUNTER — Ambulatory Visit
Admission: RE | Admit: 2015-06-06 | Discharge: 2015-06-06 | Disposition: A | Discharge status: Home / Self Care | Payer: Medicare HMO | Source: Ambulatory Visit | Attending: Surgery | Admitting: Surgery

## 2015-06-06 DIAGNOSIS — R2231 Localized swelling, mass and lump, right upper limb: Secondary | ICD-10-CM

## 2015-06-06 DIAGNOSIS — N6320 Unspecified lump in the left breast, unspecified quadrant: Secondary | ICD-10-CM

## 2015-06-06 DIAGNOSIS — R2232 Localized swelling, mass and lump, left upper limb: Secondary | ICD-10-CM

## 2015-06-17 ENCOUNTER — Other Ambulatory Visit: Payer: Self-pay | Admitting: Oncology

## 2015-06-17 ENCOUNTER — Telehealth: Payer: Self-pay | Admitting: Oncology

## 2015-06-17 NOTE — Telephone Encounter (Signed)
SPOKE WITH PT REQUESTED THAT WE CONTACT DAUGHTER TO SCHEDULE ALL APPT. DAUGHTER CONFIRMED APPT FOR 1/30 AND MAILED OUT NEW PT PACKET

## 2015-06-17 NOTE — Telephone Encounter (Signed)
Lt mess regarding new pt referral.  °

## 2015-06-18 ENCOUNTER — Telehealth: Payer: Self-pay | Admitting: *Deleted

## 2015-06-18 NOTE — Telephone Encounter (Signed)
Mailed new pt packet to pt.  

## 2015-06-19 ENCOUNTER — Telehealth: Payer: Self-pay | Admitting: Oncology

## 2015-06-19 NOTE — Telephone Encounter (Signed)
Daugther called with questions regarding the process for scheduling appt for PET.  Pls call daughter to schedule PET.

## 2015-06-19 NOTE — Progress Notes (Signed)
[place]  [phone] [phone]     ID: Danielle [name] DOB: 12-28-1925  MR#: [id number]  YHC#:[id number]  Patient Care Team: Bartholome Bill, MD as PCP - General (Family Medicine) Armandina Gemma, MD as Consulting Physician (General Surgery) Chauncey Cruel, MD as Consulting Physician (Oncology) Allyn Kenner, MD (Dermatology) Gaynelle Arabian, MD as Consulting Physician (Orthopedic Surgery) PCP: Bartholome Bill, MD GYN: OTHER MD:  CHIEF COMPLAINT: Estrogen receptor positive breast cancer; stage IV malignant melanoma  CURRENT TREATMENT: Anastrozole   BREAST CANCER HISTORY: Danielle [name] she had an itchy spot in the middle of her back which she scratched sometimes, but this was not evaluated until [facility] fell off a chair while changing a light bulb and broke her left upper extremity. Because she couldn't change while in a sling, her daughter Danielle [name] had 2 give her a hand and in the process noted a black lesion in the middle of Danielle [name]'s back. She took married to Danielle. Juel [name] office where on 04/17/2015 he performed a shave biopsy of a pigmented lesion over the spine in the upper back. The pathology report ([id number]) showed a breast low depth of at least 6.5 mm, with the anatomic level at least 4. There were 23 mitoses per square millimeter and extensive ulceration. There was no satellitosis. There was no evidence of regression. They host response was not brisk. This was read as a pathology stage T4b and the patient was referred to Danielle [name] for wide excision.  However on exam Danielle. jerking noted bilateral axillary adenopathy and also a mass in the upper inner quadrant of the left breast. He set Danielle [name] up for bilateral mammography with tomography 06/06/2015, and this showed an irregular hyperdense mass in the left breast upper inner quadrant which was palpable. There was also a firm palpable mass in the left axilla as well as in the contralateral, right axilla. Ultrasonography  showed the left breast mass to measure 2.7 cm and at least 2 abnormally enlarged lymph nodes in the left axilla the larger one measuring 3.9 cm. In the right axilla there were also 2 abnormal appearing lymph nodes, the larger measuring 2.6 cm.  On 06/06/2015 Danielle [name] underwent biopsy of the left breast mass and both axillary adenopathy areas. The left breast mass proved to be an invasive ductal carcinoma , grade 2, estrogen and progesterone receptor positive, both at 100%, with strong staining intensity. HER-2 was not amplified, with a signals ratio of 1.42, the average copy number per cell being 2.55. The proliferation marker was 5%.  More importantly, both axillary lymph nodes showed metastatic melanoma. We were contacted and prior to today's visit we obtained a PET scan, which shows in addition to the left breast mass and the bilateral axillary lymph nodes (described as just deep to the latissimus dorsi muscles bilaterally) 2 hypermetabolic foci in the left lobe of the liver, measuring 1.3 and 0.8 cm, with an SUV max of 6.3. A 1.5 cm mass next to the left [name], clearly separate from the adrenal gland, is also hypermetabolic with an SUV max of 8.2. There were no lung or bone lesions.  Danielle [name] subsequent history is as detailed below   INTERVAL HISTORY: Danielle [name] was evaluated in the oncology clinic 07/01/2015 accompanied by her daughter Danielle [name]. Her case was also presented in the multidisciplinary breast cancer conference that same morning. At that time a preliminary plan was proposed: Consider left lumpectomy and bilateral axillary lymph node dissection if staging studies showed no evidence of metastatic melanoma  spread outside of the axillary lymph nodes.  REVIEW OF SYSTEMS: Danielle [name]'s left humeral fracture has healed moderately well, and she has not yet regained her full functional status. She use to go to the wife 5 days a week. She is now beginning to drive again. She gets tired easily. Her joint symptoms  particularly the knees and feet hurt sometimes. Her appetite is poor and she has lost a little bit of weight. She denies unusual headaches, visual changes, nausea, vomiting, 8 imbalance, cough, phlegm production, pleurisy, chest pain or pressure, palpitations, angina, or change in bowel or bladder habits. She's not aware of any other skin lesions of concern. A detailed review of systems today was otherwise stable  PAST MEDICAL HISTORY: No past medical history on file. Remote history of rectal prolapse with chronic constipation resulting, history of left humeral fracture, osteopenia  PAST SURGICAL HISTORY: Past Surgical History  Procedure Laterality Date  . Hip surgery      FAMILY HISTORY No family history on file. The patient's father died at the age of 9 in the setting of a call abuse. The patient's mother died from "old age" at 33. The patient had 4 brothers, no sisters. One brother died in his 45s from brain cancer. Another died from a stroke, and other from emphysema. The 52, youngest brother is 26 and jogs every day. There is no history of breast or ovarian cancer in the family. There is also no prior history of melanoma.  GYNECOLOGIC HISTORY:  No LMP recorded. Patient is postmenopausal. Menarche age 82, first live birth age 50, the patient is [name] P4. She stopped having periods in her early 80s. She did not use hormone replacement. She never took oral contraceptives.  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 She is widowed. She is now retired, and lives alone, with no pets. Her son Danielle [name] lives in [place], and is a retired Optometrist. Son Danielle [name] lives in [place] and is a Chief of Staff. Daughter Danielle [name] is a Marine scientist at [employer] locally. The fourth child, Danielle [name], died in an automobile accident at age 50. The patient has 4 grandchildren, no great grandchildren. She is a Corporate treasurer. She attends services  at [employer].    ADVANCED DIRECTIVES: Not yet notarized; Danielle [name] intends to name her daughter Danielle [name] as her healthcare power of attorney. She can be reached at [phone]   HEALTH MAINTENANCE: Social History  Substance Use Topics  . Smoking status: Never Smoker   . Smokeless tobacco: Not on file  . Alcohol Use: No     Colonoscopy: Remote  PAP:  Bone density: Remote  Lipid panel:  Allergies  Allergen Reactions  . Vicodin [Hydrocodone-Acetaminophen] Nausea Only    Current Outpatient Prescriptions  Medication Sig Dispense Refill  . aspirin EC 81 MG tablet Take by mouth.    . enalapril (VASOTEC) 20 MG tablet Take 20 mg by mouth 2 (two) times daily.     . Lutein 20 MG CAPS Take by mouth.    . Red Yeast Rice Extract (RED YEAST RICE PO) Take by mouth.    . triamterene-hydrochlorothiazide (DYAZIDE) 37.5-25 MG capsule Take 1 capsule by mouth daily.     . verapamil (CALAN-SR) 240 MG CR tablet   0  . ondansetron (ZOFRAN ODT) 8 MG disintegrating tablet Take 1 tablet (8 mg total) by mouth every 8 (eight) hours as needed for nausea or vomiting. (Patient not taking:  Reported on 07/01/2015) 15 tablet 0   No current facility-administered medications for this visit.    OBJECTIVE: Older white woman in no acute distress Filed Vitals:   07/01/15 1555  BP: 173/77  Pulse: 85  Temp: 97.8 F (36.6 C)  Resp: 19     Body mass index is 20.48 kg/(m^2).    ECOG FS:1 - Symptomatic but completely ambulatory  Ocular: Sclerae unicteric, pupils equal, round and reactive to light Ear-nose-throat: Oropharynx clear and moist Lymphatic: No cervical or supraclavicular adenopathy Lungs no rales or rhonchi, good excursion bilaterally Heart regular rate and rhythm, no murmur appreciated Abd soft, nontender, positive bowel sounds MSK no focal spinal tenderness, no joint edema Neuro: non-focal, well-oriented, appropriate affect Breasts: The right breast is unremarkable. In the right axilla there is a  palpable mass measuring approximately 2 cm. In the upper portion of the left breast there is an easily palpable mass measuring approximately 1 inch. It is not red or tender. It is easily movable. In the left axilla there is a palpable mass measuring approximately 3 cm. Skin: In the mid upper back over the spine there is a 1 x 1 cm pink irregular scar where the patient's original melanoma was shaved off  LAB RESULTS:  CMP     Component Value Date/Time   NA 134* 07/30/2006 1318   K 4.3 07/30/2006 1318   CL 100 07/30/2006 1318   CO2 24 07/30/2006 1318   GLUCOSE 102* 07/30/2006 1318   BUN 19 07/30/2006 1318   CREATININE 0.7 07/30/2006 1318   CALCIUM 9.4 07/30/2006 1318   GFRNONAA 86 07/30/2006 1318   GFRAA 104 07/30/2006 1318    INo results found for: SPEP, UPEP  Lab Results  Component Value Date   WBC 12.7* 07/30/2006   NEUTROABS 10.3* 07/30/2006   HGB 11.2* 07/30/2006   HCT 33.0* 07/30/2006   MCV 80.1 07/30/2006   PLT 567* 07/30/2006      Chemistry      Component Value Date/Time   NA 134* 07/30/2006 1318   K 4.3 07/30/2006 1318   CL 100 07/30/2006 1318   CO2 24 07/30/2006 1318   BUN 19 07/30/2006 1318   CREATININE 0.7 07/30/2006 1318      Component Value Date/Time   CALCIUM 9.4 07/30/2006 1318       No results found for: LABCA2  No components found for: [id number]  No results for input(s): INR in the last 168 hours.  Urinalysis No results found for: COLORURINE, APPEARANCEUR, LABSPEC, PHURINE, GLUCOSEU, HGBUR, BILIRUBINUR, KETONESUR, PROTEINUR, UROBILINOGEN, NITRITE, LEUKOCYTESUR  STUDIES: Nm Pet Image Initial (pi) Whole Body  07/01/2015  CLINICAL DATA:  Initial treatment strategy for malignant melanoma involving both axillary, and newly diagnosed breast cancer. EXAM: NUCLEAR MEDICINE PET WHOLE BODY TECHNIQUE: 5.91 mCi F-18 FDG was injected intravenously. CT data was obtained and used for attenuation correction and anatomic localization only. (This was not  acquired as a diagnostic CT examination.) Additional exam technical data entered on technologist worksheet. FASTING BLOOD GLUCOSE:  Value: 98 mg/dl COMPARISON:  CT of the abdomen and pelvis 08/23/2006. FINDINGS: Head/Neck: No hypermetabolic lymph nodes in the neck. Chest: Partially calcified hypermetabolic (SUVmax = 6.6) soft tissue lesion in the medial aspect of the left breast (image 111 of series 4) measuring 2.6 x 2.0 cm. Hypermetabolic soft tissue lesions are noted deep in the soft tissues of the axilla bilaterally, just deep to latissimus dorsi musculature. On the right the lesion measures 2.9 x 1.9 cm (SUVmax =  16.9), and on the left the lesion measures 2.5 x 1.5 cm (SUVmax = 11.5). There is also a hypermetabolic (SUVmax = 63.1) soft tissue mass in the lateral aspect of the left latissimus dorsi musculature adjacent to the insertion on the left humerus, where the mass measures 4.4 x 3.4 cm (image 80 of series 4). There is a healing fracture of the proximal third of the left humeral diaphysis with surrounding soft tissue prominence and hypermetabolism, which could be related to normal healing, or could indicate a pathologic fracture. No definite suspicious appearing pulmonary nodules or masses. Heart size is normal. No pericardial effusion. No hypermetabolic mediastinal, internal mammary or hilar lymphadenopathy. There is atherosclerosis of the thoracic aorta, the great vessels of the mediastinum and the coronary arteries, including calcified atherosclerotic plaque in the left main and left anterior descending coronary arteries. Severe calcifications of the mitral valve and mitral annulus. Moderate calcifications of the aortic valve. Abdomen/Pelvis: There are 2 foci of hypermetabolism in the left lobe of the liver, largest of which (SUVmax = 6.3) measures 8 x 13 mm (image 118 of series 4). No abnormal hypermetabolic activity within the pancreas, adrenal glands, or spleen. No hypermetabolic lymph nodes in the  abdomen or pelvis. In the medial aspect of the upper left retroperitoneum there is a soft tissue attenuation lesion measuring 1.5 x 1.1 cm (image 114 of series 4) which is hypermetabolic (SUVmax = 8.2). This lesion appears most intimately associated with the superior aspect of the left [name], and is clearly separate from the left adrenal gland. Calcifications in the central aspect of segment 8 of the liver have a benign appearance and demonstrate no associated hypermetabolism. Well-defined 2.6 x 3.6 cm hypo attenuating lesion in the superior aspect of segment 7 of the liver demonstrates no internal hypermetabolism, presumably a cyst. Small calcified gallstones lie dependently in the gallbladder. 3.1 cm low-attenuation lesion in the upper pole of the left [name] also demonstrates no internal hypermetabolism, likely a cyst. Extensive atherosclerosis throughout the abdominal and pelvic vasculature, without definite aneurysm. Severe colonic diverticulosis, without surrounding inflammatory changes to suggest an acute diverticulitis at this time. Skeleton: Postoperative changes of left hip arthroplasty. Healing fracture of the proximal third of the left humeral diaphysis with surrounding hypermetabolism, as discussed above. Old healed fracture of the proximal third of the right humeral diaphysis incidentally noted. Extremeties: In the distal aspect of the left femoral diaphysis there is a long segment of soft tissue attenuation replacing the marrow, best appreciated on images 256-270 of series 4. Within this region along the anterolateral aspect of the cortex (image 266 of series 4) there is a small focus of sclerosis, which demonstrates some low-level hypermetabolism (SUVmax = 2.7). IMPRESSION: 1. Two large soft tissue attenuation foci of hypermetabolism in the deep axillary regions bilaterally, compatible with the reported sites of melanoma involvement. 2. Large soft tissue mass centered in the lateral aspect of the  left latissimus dorsi musculature adjacent to the proximal third of the humeral diaphysis. This is presumably a metastatic lesion, either from breast cancer or melanoma. 3. 2.0 x 2.6 cm hypermetabolic lesion in the medial aspect of the left breast, corresponding to the patient's reported newly diagnosed breast cancer. 4. Two foci of hypermetabolism in the left lobe of the liver are presumably metastatic. In addition, there is a hypermetabolic focus in the distal third of the femoral diaphysis where there is extensive marrow replacement, concerning for an additional metastatic lesion. 5. Small exophytic soft tissue attenuation lesion demonstrates hypermetabolism  and appears to be associated with the upper pole of the left [name], concerning for potential primary renal neoplasm. This could be further evaluated with MRI of the abdomen with and without IV gadolinium if clinically appropriate. 6. Severe colonic diverticulosis without evidence of acute diverticulitis at this time. 7. Extensive atherosclerosis, including left main and 2 vessel coronary artery disease. 8. Cholelithiasis without evidence of acute cholecystitis at this time. 9. There are calcifications of the aortic valve and mitral valve/annulus. Echocardiographic correlation for evaluation of potential valvular dysfunction may be warranted if clinically indicated. Electronically Signed   By: Vinnie Langton M.D.   On: 07/01/2015 10:28   [place] Extrem Up Right Ltd  06/06/2015  CLINICAL DATA:  Left breast upper inner quadrant palpable mass felt by the patient 3 days ago. Clinically detected right axillary area of palpable concern. Recent diagnosis of left back melanoma. EXAM: DIGITAL DIAGNOSTIC BILATERAL MAMMOGRAM WITH 3D TOMOSYNTHESIS WITH CAD ULTRASOUND BILATERAL BREAST COMPARISON:  Previous exam(s). ACR Breast Density Category c: The breast tissue is heterogeneously dense, which may obscure small masses. FINDINGS: There are no suspicious masses, or  architectural distortion in the right breast. There is a loose group of punctate calcifications in the lower inner right breast with indeterminate appearance. It measures 1.6 x 1.2 x 3.2 cm. In the left breast upper inner quadrant, middle depth, there is a hyperdense irregular mass, which corresponds to the area of palpable concern. Mammographic images were processed with CAD. On physical exam, there is a firm palpable mass in the right axilla, and left 11 o'clock breast. Targeted ultrasound is performed, showing left breast 11 o'clock 4 cm from the nipple irregular hypoechoic mass with suspicious appearance measuring 2.7 x 1.6 x 2.7 cm. In the left axilla there are at least 2 abnormally enlarged lymph nodes with thickened cortex. The larger of the 2 measures 3.9 cm in long-axis with significant effacement of the hilum. Ultrasound evaluation of the right axilla demonstrates 2 abnormal appearing lymph nodes, the larger of which measures 2.1 by 1.7 by 2.6 cm. IMPRESSION: Left breast 11 o'clock suspicious mass measuring 2.7 cm. Bilateral axillary lymphadenopathy. Right breast lower inner quadrant cluster of indeterminate calcifications measuring 3.2 cm. in largest dimension. RECOMMENDATION: Ultrasound-guided core needle biopsy of the left 11 o'clock breast mass, and bilateral abnormal axillary lymph nodes. Further management of the indeterminate calcifications in the right breast should be based on the pathology results. Stereotactic core needle biopsy may be considered at a later date, if histologic diagnosis is important for clinical care. I have discussed the findings and recommendations with the patient. Results were also provided in writing at the conclusion of the visit. If applicable, a reminder letter will be sent to the patient regarding the next appointment. BI-RADS CATEGORY  4: Suspicious. Electronically Signed   By: Fidela Salisbury M.D.   On: 06/06/2015 12:13   Mm Digital Diagnostic Bilat  06/06/2015   CLINICAL DATA:  Status post biopsy of left breast 11 o'clock mass and bilateral axillary lymph nodes. EXAM: DIAGNOSTIC BILATERAL MAMMOGRAM POST ULTRASOUND BIOPSY COMPARISON:  Previous exam(s). FINDINGS: Mammographic images were obtained following ultrasound guided biopsy of left breast 11 o'clock mass and bilateral axillary lymph nodes. Mammographic images demonstrate ribbon shaped marker within a a solid mass in the left 11 o'clock breast, middle depth. The [facility] tissue markers placed in the bilateral axillary lymph nodes are not visualized due to their far posterior location. IMPRESSION: Successful placement of tissue marker within left 11 o'clock breast mass. Nonvisualization  of bilateral axillary lymph nodes tissue markers, due to their far posterior location. Final Assessment: Post Procedure Mammograms for Marker Placement Electronically Signed   By: Fidela Salisbury M.D.   On: 06/06/2015 14:42   US Breast Ltd Uni Left Inc Axilla  06/06/2015  CLINICAL DATA:  Left breast upper inner quadrant palpable mass felt by the patient 3 days ago. Clinically detected right axillary area of palpable concern. Recent diagnosis of left back melanoma. EXAM: DIGITAL DIAGNOSTIC BILATERAL MAMMOGRAM WITH 3D TOMOSYNTHESIS WITH CAD ULTRASOUND BILATERAL BREAST COMPARISON:  Previous exam(s). ACR Breast Density Category c: The breast tissue is heterogeneously dense, which may obscure small masses. FINDINGS: There are no suspicious masses, or architectural distortion in the right breast. There is a loose group of punctate calcifications in the lower inner right breast with indeterminate appearance. It measures 1.6 x 1.2 x 3.2 cm. In the left breast upper inner quadrant, middle depth, there is a hyperdense irregular mass, which corresponds to the area of palpable concern. Mammographic images were processed with CAD. On physical exam, there is a firm palpable mass in the right axilla, and left 11 o'clock breast. Targeted  ultrasound is performed, showing left breast 11 o'clock 4 cm from the nipple irregular hypoechoic mass with suspicious appearance measuring 2.7 x 1.6 x 2.7 cm. In the left axilla there are at least 2 abnormally enlarged lymph nodes with thickened cortex. The larger of the 2 measures 3.9 cm in long-axis with significant effacement of the hilum. Ultrasound evaluation of the right axilla demonstrates 2 abnormal appearing lymph nodes, the larger of which measures 2.1 by 1.7 by 2.6 cm. IMPRESSION: Left breast 11 o'clock suspicious mass measuring 2.7 cm. Bilateral axillary lymphadenopathy. Right breast lower inner quadrant cluster of indeterminate calcifications measuring 3.2 cm. in largest dimension. RECOMMENDATION: Ultrasound-guided core needle biopsy of the left 11 o'clock breast mass, and bilateral abnormal axillary lymph nodes. Further management of the indeterminate calcifications in the right breast should be based on the pathology results. Stereotactic core needle biopsy may be considered at a later date, if histologic diagnosis is important for clinical care. I have discussed the findings and recommendations with the patient. Results were also provided in writing at the conclusion of the visit. If applicable, a reminder letter will be sent to the patient regarding the next appointment. BI-RADS CATEGORY  4: Suspicious. Electronically Signed   By: Fidela Salisbury M.D.   On: 06/06/2015 12:13   Mm Diag Breast Tomo Bilateral  06/06/2015  CLINICAL DATA:  Left breast upper inner quadrant palpable mass felt by the patient 3 days ago. Clinically detected right axillary area of palpable concern. Recent diagnosis of left back melanoma. EXAM: DIGITAL DIAGNOSTIC BILATERAL MAMMOGRAM WITH 3D TOMOSYNTHESIS WITH CAD ULTRASOUND BILATERAL BREAST COMPARISON:  Previous exam(s). ACR Breast Density Category c: The breast tissue is heterogeneously dense, which may obscure small masses. FINDINGS: There are no suspicious masses, or  architectural distortion in the right breast. There is a loose group of punctate calcifications in the lower inner right breast with indeterminate appearance. It measures 1.6 x 1.2 x 3.2 cm. In the left breast upper inner quadrant, middle depth, there is a hyperdense irregular mass, which corresponds to the area of palpable concern. Mammographic images were processed with CAD. On physical exam, there is a firm palpable mass in the right axilla, and left 11 o'clock breast. Targeted ultrasound is performed, showing left breast 11 o'clock 4 cm from the nipple irregular hypoechoic mass with suspicious appearance measuring 2.7 x  1.6 x 2.7 cm. In the left axilla there are at least 2 abnormally enlarged lymph nodes with thickened cortex. The larger of the 2 measures 3.9 cm in long-axis with significant effacement of the hilum. Ultrasound evaluation of the right axilla demonstrates 2 abnormal appearing lymph nodes, the larger of which measures 2.1 by 1.7 by 2.6 cm. IMPRESSION: Left breast 11 o'clock suspicious mass measuring 2.7 cm. Bilateral axillary lymphadenopathy. Right breast lower inner quadrant cluster of indeterminate calcifications measuring 3.2 cm. in largest dimension. RECOMMENDATION: Ultrasound-guided core needle biopsy of the left 11 o'clock breast mass, and bilateral abnormal axillary lymph nodes. Further management of the indeterminate calcifications in the right breast should be based on the pathology results. Stereotactic core needle biopsy may be considered at a later date, if histologic diagnosis is important for clinical care. I have discussed the findings and recommendations with the patient. Results were also provided in writing at the conclusion of the visit. If applicable, a reminder letter will be sent to the patient regarding the next appointment. BI-RADS CATEGORY  4: Suspicious. Electronically Signed   By: Fidela Salisbury M.D.   On: 06/06/2015 12:13   [place] Lt Breast Bx W Loc Dev 1st Lesion Img  Bx Spec US Guide  06/07/2015  ADDENDUM REPORT: 06/07/2015 11:20 ADDENDUM: Pathology revealed grade II invasive ductal carcinoma in the left breast and metastatic melanoma in the right and left axillary lymph nodes. This was found to be concordant by Danielle. Fidela Salisbury. Pathology was discussed with the patient's daughter, Danielle [name] by telephone, at the patient's request. She reported that her mother had done well after the biopsies. Post biopsy instructions and care were reviewed and her questions were answered. Surgical consultation has been arranged with Danielle. Armandina Gemma at [facility] on June 20, 2015. My number was provided for additional questions and concerns. Pathology results reported by Susa Raring RN, BSN on June 07, 2015. Electronically Signed   By: Fidela Salisbury M.D.   On: 06/07/2015 11:20  06/07/2015  CLINICAL DATA:  Suspicious left breast 11 o'clock mass and bilateral axillary lymphadenopathy. EXAM: ULTRASOUND GUIDED LEFT BREAST CORE NEEDLE BIOPSY ULTRASOUND-GUIDED BILATERAL AXILLARY LYMPH NODE BIOPSY COMPARISON:  Previous exam(s). FINDINGS: I met with the patient and we discussed the procedure of ultrasound-guided biopsy, including benefits and alternatives. We discussed the high likelihood of a successful procedure. We discussed the risks of the procedure, including infection, bleeding, tissue injury, clip migration, and inadequate sampling. Informed written consent was given. The usual time-out protocol was performed immediately prior to the procedure. Initially the left breast 11 o'clock mass was sampled. Using sterile technique and 2% Lidocaine as local anesthetic, under direct ultrasound visualization, a 14 gauge spring-loaded device was used to perform biopsy of left breast 11 o'clock mass using a inferior approach. At the conclusion of the procedure a ribbon shaped tissue marker clip was deployed into the biopsy cavity. Next, an abnormal left axillary lymph  node was sampled in a similar fashion. Using sterile technique and 2% Lidocaine as local anesthetic, under direct ultrasound visualization, a 14 gauge spring-loaded device was used to perform biopsy of left axillary lymph node using a lateral approach. At the conclusion of the procedure a HydroMARK tissue marker clip was deployed into the biopsy cavity. Next, an abnormal right axillary lymph node was sampled in a similar fashion. Using sterile technique and 2% Lidocaine as local anesthetic, under direct ultrasound visualization, a 14 gauge spring-loaded device was used to perform biopsy of right axillary  lymph node using a medial approach. At the conclusion of the procedure a HydroMARK tissue marker clip was deployed into the biopsy cavity. Follow up 2 view mammogram was performed and dictated separately. IMPRESSION: Ultrasound guided biopsy of left breast 11 o'clock mass and bilateral axillary lymph nodes. No apparent complications. Electronically Signed: By: Fidela Salisbury M.D. 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 This was found to be concordant by Danielle. Fidela Salisbury. Pathology was discussed with the patient's daughter, Danielle [name] by telephone, at the patient's request. She reported that her mother had done well after the biopsies. Post biopsy instructions and care were reviewed and her questions were answered. Surgical consultation has been arranged with Danielle. Armandina Gemma at [facility] on June 20, 2015. My number was provided for additional questions and concerns. Pathology results reported by Susa Raring RN, BSN on June 07, 2015. Electronically Signed   By: Fidela Salisbury M.D.   On: 06/07/2015 11:20  06/07/2015  CLINICAL DATA:   Suspicious left breast 11 o'clock mass and bilateral axillary lymphadenopathy. EXAM: ULTRASOUND GUIDED LEFT BREAST CORE NEEDLE BIOPSY ULTRASOUND-GUIDED BILATERAL AXILLARY LYMPH NODE BIOPSY COMPARISON:  Previous exam(s). FINDINGS: I met with the patient and we discussed the procedure of ultrasound-guided biopsy, including benefits and alternatives. We discussed the high likelihood of a successful procedure. We discussed the risks of the procedure, including infection, bleeding, tissue injury, clip migration, and inadequate sampling. Informed written consent was given. The usual time-out protocol was performed immediately prior to the procedure. Initially the left breast 11 o'clock mass was sampled. Using sterile technique and 2% Lidocaine as local anesthetic, under direct ultrasound visualization, a 14 gauge spring-loaded device was used to perform biopsy of left breast 11 o'clock mass using a inferior approach. At the conclusion of the procedure a ribbon shaped tissue marker clip was deployed into the biopsy cavity. Next, an abnormal left axillary lymph node was sampled in a similar fashion. Using sterile technique and 2% Lidocaine as local anesthetic, under direct ultrasound visualization, a 14 gauge spring-loaded device was used to perform biopsy of left axillary lymph node using a lateral approach. At the conclusion of the procedure a HydroMARK tissue marker clip was deployed into the biopsy cavity. Next, an abnormal right axillary lymph node was sampled in a similar fashion. Using sterile technique and 2% Lidocaine as local anesthetic, under direct ultrasound visualization, a 14 gauge spring-loaded device was used to perform biopsy of right axillary lymph node using a medial approach. At the conclusion of the procedure a HydroMARK tissue marker clip was deployed into the biopsy cavity. Follow up 2 view mammogram was performed and dictated separately. IMPRESSION: Ultrasound guided biopsy of left breast 11 o'clock  mass and bilateral axillary lymph nodes. No apparent complications. Electronically Signed: By: Fidela Salisbury M.D. 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 This was found to be concordant by Danielle. Fidela Salisbury. Pathology was discussed with the patient's daughter, Danielle [name] by telephone, at the patient's request. She reported that her  mother had done well after the biopsies. Post biopsy instructions and care were reviewed and her questions were answered. Surgical consultation has been arranged with Danielle. Armandina Gemma at [facility] on June 20, 2015. My number was provided for additional questions and concerns. Pathology results reported by Susa Raring RN, BSN on June 07, 2015. Electronically Signed   By: Fidela Salisbury M.D.   On: 06/07/2015 11:20  06/07/2015  CLINICAL DATA:  Suspicious left breast 11 o'clock mass and bilateral axillary lymphadenopathy. EXAM: ULTRASOUND GUIDED LEFT BREAST CORE NEEDLE BIOPSY ULTRASOUND-GUIDED BILATERAL AXILLARY LYMPH NODE BIOPSY COMPARISON:  Previous exam(s). FINDINGS: I met with the patient and we discussed the procedure of ultrasound-guided biopsy, including benefits and alternatives. We discussed the high likelihood of a successful procedure. We discussed the risks of the procedure, including infection, bleeding, tissue injury, clip migration, and inadequate sampling. Informed written consent was given. The usual time-out protocol was performed immediately prior to the procedure. Initially the left breast 11 o'clock mass was sampled. Using sterile technique and 2% Lidocaine as local anesthetic, under direct ultrasound visualization, a 14 gauge spring-loaded device was used to perform biopsy of left breast 11  o'clock mass using a inferior approach. At the conclusion of the procedure a ribbon shaped tissue marker clip was deployed into the biopsy cavity. Next, an abnormal left axillary lymph node was sampled in a similar fashion. Using sterile technique and 2% Lidocaine as local anesthetic, under direct ultrasound visualization, a 14 gauge spring-loaded device was used to perform biopsy of left axillary lymph node using a lateral approach. At the conclusion of the procedure a HydroMARK tissue marker clip was deployed into the biopsy cavity. Next, an abnormal right axillary lymph node was sampled in a similar fashion. Using sterile technique and 2% Lidocaine as local anesthetic, under direct ultrasound visualization, a 14 gauge spring-loaded device was used to perform biopsy of right axillary lymph node using a medial approach. At the conclusion of the procedure a HydroMARK tissue marker clip was deployed into the biopsy cavity. Follow up 2 view mammogram was performed and dictated separately. IMPRESSION: Ultrasound guided biopsy of left breast 11 o'clock mass and bilateral axillary lymph nodes. No apparent complications. Electronically Signed: By: Fidela Salisbury M.D. On: 06/06/2015 14:40    ASSESSMENT: 82 y.o. [place] woman with concurrent breast cancer and melanoma  BREAST CANCER: (1) left breast upper inner quadrant biopsy 06/06/2015 shows a clinical T2 NX, stage II invasive ductal carcinoma , grade 2, estrogen receptor and progesterone receptor strongly positive, with an MIB-1 of 5%, and no HER-2 amplification, the signals ratio being 1.42 and the number per cell 2.55.   (2) anastrozole started 07/01/2015  MALIGNANT MELANOMA: (a) shave biopsy from the mid upper back 04/17/2015 shows invasive malignant melanoma, Breslow depth 6.5 mm plus, Clark's level 4+, with 23 mitoses per square millimeter, extensive ulceration,, with involved edges, and non-brisk host response  (b) biopsy of right and left  axillary lymph nodes 06/06/2015 showed metastatic melanoma, mutational status pending   PLAN: Zoe's situation obviously is complex and it is best to separate her cancers in terms of treatment options and prognosis.  As far as her breast cancer is concerned, she appears to have a stage II invasive ductal carcinoma which is strongly estrogen and progesterone receptor positive and HER-2 not amplified. At some point we may want to proceed to left lumpectomy but before doing that it would be good to know what her overall prognosis is as far as  the melanoma is concerned. For that reason we are starting with systemic treatment for the breast cancer and that means anastrozole.  Today we discussed the possible toxicities, side effects and complications of anastrozole and I went ahead and put in the prescription for her. At some point later this year we may want to repeat a left breast ultrasound to confirm response. At some point in the future a left lumpectomy can be performed for local control.  She understands that the malignant melanoma is the more dangerous tumor in her situation. It is not curable. Treatment options however have recently exploded and the real question here is how well will she be able to tolerate aggressive therapy. I have requested a BRAF mutation analysis and if her myeloma is positive we will likely treat her with dabrafenib and TRAMETINIB. If the tumor is BRAF wild-type we will likely go with pembrolizumab.  Today I gave her information on the possible side effects, toxicities and complications of these 3 agents. We should know what our options are within the next 10 days and I will see her again at that time so we can make a definitive choice. In the meantime we're going to complete her staging with an echocardiogram, brain MRI, and bone density scan.  Demitria has a good understanding of the overall plan. She agrees with it. She will call with any problems that may develop before her  next visit here.  Chauncey Cruel, MD   07/01/2015 5:04 PM Medical Oncology and Hematology [facility] [address] [place], [place] [address] Tel. [phone]    Fax. [phone]

## 2015-06-24 ENCOUNTER — Telehealth: Payer: Self-pay | Admitting: *Deleted

## 2015-06-24 ENCOUNTER — Other Ambulatory Visit: Payer: Self-pay | Admitting: *Deleted

## 2015-06-24 NOTE — Telephone Encounter (Signed)
This RN contacted [employer] and obtained new authorization for currently scheduled PET- whole body due to known melanoma and breast cancer diagnosis.  Authorization number given as [id number].

## 2015-06-25 ENCOUNTER — Encounter (HOSPITAL_COMMUNITY): Admission: RE | Admit: 2015-06-25 | Payer: Medicare HMO | Source: Ambulatory Visit

## 2015-06-28 ENCOUNTER — Other Ambulatory Visit: Payer: Self-pay | Admitting: *Deleted

## 2015-06-28 DIAGNOSIS — C50919 Malignant neoplasm of unspecified site of unspecified female breast: Secondary | ICD-10-CM

## 2015-06-28 DIAGNOSIS — C439 Malignant melanoma of skin, unspecified: Secondary | ICD-10-CM

## 2015-07-01 ENCOUNTER — Ambulatory Visit (HOSPITAL_COMMUNITY)
Admission: RE | Admit: 2015-07-01 | Discharge: 2015-07-01 | Disposition: A | Discharge status: Home / Self Care | Payer: Medicare HMO | Source: Ambulatory Visit | Attending: Oncology | Admitting: Oncology

## 2015-07-01 ENCOUNTER — Ambulatory Visit (HOSPITAL_BASED_OUTPATIENT_CLINIC_OR_DEPARTMENT_OTHER): Payer: Medicare HMO | Admitting: Oncology

## 2015-07-01 ENCOUNTER — Other Ambulatory Visit (HOSPITAL_BASED_OUTPATIENT_CLINIC_OR_DEPARTMENT_OTHER): Payer: Medicare HMO

## 2015-07-01 VITALS — BP 173/77 | HR 85 | Temp 97.8°F | Resp 19 | Wt 119.4 lb

## 2015-07-01 DIAGNOSIS — M8580 Other specified disorders of bone density and structure, unspecified site: Secondary | ICD-10-CM

## 2015-07-01 DIAGNOSIS — K8020 Calculus of gallbladder without cholecystitis without obstruction: Secondary | ICD-10-CM | POA: Insufficient documentation

## 2015-07-01 DIAGNOSIS — N6489 Other specified disorders of breast: Secondary | ICD-10-CM | POA: Insufficient documentation

## 2015-07-01 DIAGNOSIS — C50212 Malignant neoplasm of upper-inner quadrant of left female breast: Secondary | ICD-10-CM

## 2015-07-01 DIAGNOSIS — K5730 Diverticulosis of large intestine without perforation or abscess without bleeding: Secondary | ICD-10-CM | POA: Insufficient documentation

## 2015-07-01 DIAGNOSIS — Z170 Estrogen receptor positive status [ER+]: Secondary | ICD-10-CM

## 2015-07-01 DIAGNOSIS — C439 Malignant melanoma of skin, unspecified: Secondary | ICD-10-CM | POA: Insufficient documentation

## 2015-07-01 DIAGNOSIS — C773 Secondary and unspecified malignant neoplasm of axilla and upper limb lymph nodes: Secondary | ICD-10-CM | POA: Diagnosis not present

## 2015-07-01 DIAGNOSIS — I700 Atherosclerosis of aorta: Secondary | ICD-10-CM

## 2015-07-01 DIAGNOSIS — I2510 Atherosclerotic heart disease of native coronary artery without angina pectoris: Secondary | ICD-10-CM | POA: Insufficient documentation

## 2015-07-01 DIAGNOSIS — C4359 Malignant melanoma of other part of trunk: Secondary | ICD-10-CM

## 2015-07-01 DIAGNOSIS — C787 Secondary malignant neoplasm of liver and intrahepatic bile duct: Secondary | ICD-10-CM | POA: Insufficient documentation

## 2015-07-01 DIAGNOSIS — C50019 Malignant neoplasm of nipple and areola, unspecified female breast: Secondary | ICD-10-CM

## 2015-07-01 DIAGNOSIS — C438 Malignant melanoma of overlapping sites of skin: Secondary | ICD-10-CM

## 2015-07-01 LAB — GLUCOSE, CAPILLARY: Glucose-Capillary: 98 mg/dL (ref 65–99)

## 2015-07-01 MED ORDER — FLUDEOXYGLUCOSE F - 18 (FDG) INJECTION
5.9100 | Freq: Once | INTRAVENOUS | Status: AC | PRN
  Administered 2015-07-01: 5.91 via INTRAVENOUS

## 2015-07-01 MED ORDER — ANASTROZOLE 1 MG PO TABS: 1.0000 mg | ORAL_TABLET | Freq: Every day | ORAL | Status: DC

## 2015-07-02 ENCOUNTER — Telehealth: Payer: Self-pay | Admitting: Oncology

## 2015-07-02 NOTE — Telephone Encounter (Signed)
Called Patient's daughter and notified her of Patient's upcoming Scheduled Appointment. Also called Benedetto Goad for authorization of the Echocardiogram.     [facility].

## 2015-07-05 ENCOUNTER — Encounter (HOSPITAL_COMMUNITY): Payer: Self-pay

## 2015-07-10 ENCOUNTER — Other Ambulatory Visit: Payer: Self-pay | Admitting: Oncology

## 2015-07-12 ENCOUNTER — Telehealth: Payer: Self-pay | Admitting: Oncology

## 2015-07-12 ENCOUNTER — Other Ambulatory Visit (HOSPITAL_BASED_OUTPATIENT_CLINIC_OR_DEPARTMENT_OTHER): Payer: Medicare HMO

## 2015-07-12 ENCOUNTER — Ambulatory Visit (HOSPITAL_BASED_OUTPATIENT_CLINIC_OR_DEPARTMENT_OTHER): Payer: Medicare HMO | Admitting: Oncology

## 2015-07-12 ENCOUNTER — Other Ambulatory Visit: Payer: Self-pay | Admitting: Oncology

## 2015-07-12 ENCOUNTER — Encounter (HOSPITAL_COMMUNITY): Payer: Self-pay

## 2015-07-12 ENCOUNTER — Ambulatory Visit (HOSPITAL_COMMUNITY)
Admission: RE | Admit: 2015-07-12 | Discharge: 2015-07-12 | Disposition: A | Discharge status: Home / Self Care | Payer: Medicare HMO | Source: Ambulatory Visit | Attending: Oncology | Admitting: Oncology

## 2015-07-12 VITALS — BP 163/65 | HR 72 | Temp 97.8°F | Resp 18 | Ht 64.0 in | Wt 119.1 lb

## 2015-07-12 DIAGNOSIS — C50919 Malignant neoplasm of unspecified site of unspecified female breast: Secondary | ICD-10-CM

## 2015-07-12 DIAGNOSIS — C50212 Malignant neoplasm of upper-inner quadrant of left female breast: Secondary | ICD-10-CM

## 2015-07-12 DIAGNOSIS — C4359 Malignant melanoma of other part of trunk: Secondary | ICD-10-CM | POA: Diagnosis not present

## 2015-07-12 DIAGNOSIS — Z170 Estrogen receptor positive status [ER+]: Secondary | ICD-10-CM

## 2015-07-12 DIAGNOSIS — I340 Nonrheumatic mitral (valve) insufficiency: Secondary | ICD-10-CM | POA: Insufficient documentation

## 2015-07-12 DIAGNOSIS — C438 Malignant melanoma of overlapping sites of skin: Secondary | ICD-10-CM

## 2015-07-12 DIAGNOSIS — C773 Secondary and unspecified malignant neoplasm of axilla and upper limb lymph nodes: Secondary | ICD-10-CM | POA: Diagnosis not present

## 2015-07-12 DIAGNOSIS — I371 Nonrheumatic pulmonary valve insufficiency: Secondary | ICD-10-CM | POA: Insufficient documentation

## 2015-07-12 DIAGNOSIS — Z79811 Long term (current) use of aromatase inhibitors: Secondary | ICD-10-CM

## 2015-07-12 DIAGNOSIS — I10 Essential (primary) hypertension: Secondary | ICD-10-CM

## 2015-07-12 DIAGNOSIS — C50019 Malignant neoplasm of nipple and areola, unspecified female breast: Secondary | ICD-10-CM

## 2015-07-12 DIAGNOSIS — E785 Hyperlipidemia, unspecified: Secondary | ICD-10-CM | POA: Insufficient documentation

## 2015-07-12 DIAGNOSIS — C439 Malignant melanoma of skin, unspecified: Secondary | ICD-10-CM

## 2015-07-12 DIAGNOSIS — I700 Atherosclerosis of aorta: Secondary | ICD-10-CM

## 2015-07-12 LAB — CBC & DIFF AND RETIC
BASO%: 0.4 % (ref 0.0–2.0)
BASOS ABS: 0 10*3/uL (ref 0.0–0.1)
EOS%: 0.8 % (ref 0.0–7.0)
Eosinophils Absolute: 0.1 10*3/uL (ref 0.0–0.5)
HEMATOCRIT: 37.9 % (ref 34.8–46.6)
HEMOGLOBIN: 12.5 g/dL (ref 11.6–15.9)
IMMATURE RETIC FRACT: 1.4 % — AB (ref 1.60–10.00)
LYMPH#: 0.8 10*3/uL — AB (ref 0.9–3.3)
LYMPH%: 11.6 % — AB (ref 14.0–49.7)
MCH: 28.4 pg (ref 25.1–34.0)
MCHC: 33 g/dL (ref 31.5–36.0)
MCV: 86.1 fL (ref 79.5–101.0)
MONO#: 0.6 10*3/uL (ref 0.1–0.9)
MONO%: 8.7 % (ref 0.0–14.0)
NEUT#: 5.7 10*3/uL (ref 1.5–6.5)
NEUT%: 78.5 % — AB (ref 38.4–76.8)
PLATELETS: 247 10*3/uL (ref 145–400)
RBC: 4.4 10*6/uL (ref 3.70–5.45)
RDW: 12.9 % (ref 11.2–14.5)
RETIC CT ABS: 23.76 10*3/uL — AB (ref 33.70–90.70)
Retic %: 0.54 % — ABNORMAL LOW (ref 0.70–2.10)
WBC: 7.3 10*3/uL (ref 3.9–10.3)

## 2015-07-12 NOTE — Telephone Encounter (Signed)
Appointments made and avs printed °

## 2015-07-12 NOTE — Progress Notes (Signed)
[place]  [phone] Fax:([phone]) [phone]     ID: Danielle [name] DOB: 1926/01/31  MR#: [id number]  DJM#:[id number]  Patient Care Team: Bartholome Bill, MD as PCP - General (Family Medicine) Armandina Gemma, MD as Consulting Physician (General Surgery) Chauncey Cruel, MD as Consulting Physician (Oncology) Allyn Kenner, MD (Dermatology) Gaynelle Arabian, MD as Consulting Physician (Orthopedic Surgery) PCP: Bartholome Bill, MD GYN: OTHER MD:  CHIEF COMPLAINT: Estrogen receptor positive breast cancer; stage IV malignant melanoma  CURRENT TREATMENT: Anastrozole, pembrolizumab   BREAST CANCER HISTORY: From the original intake note:  Danielle [name] she had an itchy spot in the middle of her back which she scratched sometimes, but this was not evaluated until [facility] fell off a chair while changing a light bulb and broke her left upper extremity. Because she couldn't change while in a sling, her daughter Danielle [name] had 2 give her a hand and in the process noted a black lesion in the middle of Danielle [name]'s back. She took married to Danielle [name] office where on 04/17/2015 he performed a shave biopsy of a pigmented lesion over the spine in the upper back. The pathology report ([id number]) showed a breast low depth of at least 6.5 mm, with the anatomic level at least 4. There were 23 mitoses per square millimeter and extensive ulceration. There was no satellitosis. There was no evidence of regression. They host response was not brisk. This was read as a pathology stage T4b and the patient was referred to [name] for wide excision.  However on exam Dr. jerking noted bilateral axillary adenopathy and also a mass in the upper inner quadrant of the left breast. He set Danielle [name] up for bilateral mammography with tomography 06/06/2015, and this showed an irregular hyperdense mass in the left breast upper inner quadrant which was palpable. There was also a firm palpable mass in the left axilla as well as in  the contralateral, right axilla. Ultrasonography showed the left breast mass to measure 2.7 cm and at least 2 abnormally enlarged lymph nodes in the left axilla the larger one measuring 3.9 cm. In the right axilla there were also 2 abnormal appearing lymph nodes, the larger measuring 2.6 cm.  On 06/06/2015 Danielle [name] underwent biopsy of the left breast mass and both axillary adenopathy areas. The left breast mass proved to be an invasive ductal carcinoma , grade 2, estrogen and progesterone receptor positive, both at 100%, with strong staining intensity. HER-2 was not amplified, with a signals ratio of 1.42, the average copy number per cell being 2.55. The proliferation marker was 5%.  More importantly, both axillary lymph nodes showed metastatic melanoma. We were contacted and prior to today's visit we obtained a PET scan, which shows in addition to the left breast mass and the bilateral axillary lymph nodes (described as just deep to the latissimus dorsi muscles bilaterally) 2 hypermetabolic foci in the left lobe of the liver, measuring 1.3 and 0.8 cm, with an SUV max of 6.3. A 1.5 cm mass next to the left [name], clearly separate from the adrenal gland, is also hypermetabolic with an SUV max of 8.2. There were no lung or bone lesions.  Danielle [name] subsequent history is as detailed below   INTERVAL HISTORY: Danielle [name] returns today for follow-up of her synchronous cancers accompanied by her daughter Danielle [name]. As far as her breast cancer is concerned, she started anastrozole 07/02/2015. So far she is tolerating it well. She has not had any problems with hot flashes. She obtain it at  less than $7 per month.  As far as her melanoma is concerned she had a PET scan very 12/19/2015. This shows multiple areas of uptake especially in both axillae, under the left latissimus dorsi muscle, and in an area near the left [name] (clearly separate from the adrenal gland). We also see the left breast cancer currently measuring 2.6 cm.  Finally there are 2 lesions in the liver. These are presumably metastatic, but the patient did have problems with liver abscesses in the past and at some point we may need to biopsy these lesions to find out exactly what they consist of.  Danielle [name] had an echocardiogram today, with results pending, and she is scheduled for a brain MRI to 14 2017. That will complete her workup. Finally we received the results of her genomic from her melanoma and the tumor is BRAF mutation negative (wild type)  She is here to discuss treatment options  REVIEW OF SYSTEMS: Danielle [name] has left knee pain, and there is a little bit of uptake on the PET scan there. This is not going to be metastatic. She fractured that bone 2 in the past and likely she is having arthritis and some inflammation there. Of course she has some low back pain which is chronic and not more intense or persistent than before. She bruises easily (on aspirin daily).. She doesn't have any discomfort from the bilateral axillary adenopathy. There have been no unusual headaches. His been no nausea or vomiting. She denies cough phlegm production or pleurisy. She denies any taste alteration, loss of appetite or early satiety. There has been no rash, bleeding, or diarrhea. A detailed review of systems today was otherwise stable  PAST MEDICAL HISTORY: No past medical history on file. Remote history of rectal prolapse with chronic constipation resulting, history of left humeral fracture, osteopenia  PAST SURGICAL HISTORY: Past Surgical History  Procedure Laterality Date  . Hip surgery      FAMILY HISTORY No family history on file. The patient's father died at the age of 10 in the setting of a call abuse. The patient's mother died from "old age" at 59. The patient had 4 brothers, no sisters. One brother died in his 19s from brain cancer. Another died from a stroke, and other from emphysema. The 9, youngest brother is 60 and jogs every day. There is no history  of breast or ovarian cancer in the family. There is also no prior history of melanoma.  GYNECOLOGIC HISTORY:  No LMP recorded. Patient is postmenopausal. Menarche age 76, first live birth age 30, the patient is [place] P4. She stopped having periods in her early 14s. She did not use hormone replacement. She never took oral contraceptives.  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 She is widowed. She is now retired, and lives alone, with no pets. Her son Danielle [name] lives in [place], and is a retired Optometrist. Son Danielle [name] lives in [place] and is a Chief of Staff. Daughter Danielle [name] is a Marine scientist at [employer] locally. The fourth child, Danielle [name], died in an automobile accident at age 56. The patient has 4 grandchildren, no great grandchildren. She is a Corporate treasurer. She attends services at [employer].    ADVANCED DIRECTIVES: Not yet notarized; Danielle [name] intends to name her daughter Danielle [name] as her healthcare power of attorney. She can be reached at [phone]   HEALTH MAINTENANCE: Social History  Substance Use Topics  . Smoking status:  Never Smoker   . Smokeless tobacco: Not on file  . Alcohol Use: No     Colonoscopy: Remote  PAP:  Bone density: Remote  Lipid panel:  Allergies  Allergen Reactions  . Vicodin [Hydrocodone-Acetaminophen] Nausea Only    Current Outpatient Prescriptions  Medication Sig Dispense Refill  . anastrozole (ARIMIDEX) 1 MG tablet Take 1 tablet (1 mg total) by mouth daily. 90 tablet 4  . aspirin EC 81 MG tablet Take by mouth.    . enalapril (VASOTEC) 20 MG tablet Take 20 mg by mouth 2 (two) times daily.     . Lutein 20 MG CAPS Take by mouth.    . ondansetron (ZOFRAN ODT) 8 MG disintegrating tablet Take 1 tablet (8 mg total) by mouth every 8 (eight) hours as needed for nausea or vomiting. (Patient not taking: Reported on 07/01/2015) 15 tablet 0  . Red Yeast Rice Extract (RED YEAST RICE PO) Take  by mouth.    . triamterene-hydrochlorothiazide (DYAZIDE) 37.5-25 MG capsule Take 1 capsule by mouth daily.     . verapamil (CALAN-SR) 240 MG CR tablet   0   No current facility-administered medications for this visit.    OBJECTIVE: Older white woman who appears stated age 29 Vitals:   07/12/15 1305  BP: 163/65  Pulse: 72  Temp: 97.8 F (36.6 C)  Resp: 18     Body mass index is 20.43 kg/(m^2).    ECOG FS:1 - Symptomatic but completely ambulatory  Sclerae unicteric, pupils round and equal Oropharynx clear and moist-- no thrush or other lesions No cervical or supraclavicular adenopathy Lungs no rales or rhonchi Heart regular rate and rhythm Abd soft, nontender, positive bowel sounds, no masses palpated MSK no focal spinal tenderness, no upper extremity lymphedema Neuro: nonfocal, well oriented, positive affect Breasts: The breast exam today was deferred; however both axillae were palpated. There are bilateral axillary lymph nodes which are easily palpable, right greater than left. The one on the right measures at least 3 cm and one on the left at least 2 cm. There are movable rubbery and nontender.    LAB RESULTS:  CMP     Component Value Date/Time   NA 134* 07/30/2006 1318   K 4.3 07/30/2006 1318   CL 100 07/30/2006 1318   CO2 24 07/30/2006 1318   GLUCOSE 102* 07/30/2006 1318   BUN 19 07/30/2006 1318   CREATININE 0.7 07/30/2006 1318   CALCIUM 9.4 07/30/2006 1318   GFRNONAA 86 07/30/2006 1318   GFRAA 104 07/30/2006 1318    INo results found for: SPEP, UPEP  Lab Results  Component Value Date   WBC 7.3 07/12/2015   NEUTROABS 5.7 07/12/2015   HGB 12.5 07/12/2015   HCT 37.9 07/12/2015   MCV 86.1 07/12/2015   PLT 247 07/12/2015      Chemistry      Component Value Date/Time   NA 134* 07/30/2006 1318   K 4.3 07/30/2006 1318   CL 100 07/30/2006 1318   CO2 24 07/30/2006 1318   BUN 19 07/30/2006 1318   CREATININE 0.7 07/30/2006 1318      Component Value  Date/Time   CALCIUM 9.4 07/30/2006 1318       No results found for: LABCA2  No components found for: [id number]  No results for input(s): INR in the last 168 hours.  Urinalysis No results found for: COLORURINE, APPEARANCEUR, LABSPEC, PHURINE, GLUCOSEU, HGBUR, BILIRUBINUR, KETONESUR, PROTEINUR, UROBILINOGEN, NITRITE, LEUKOCYTESUR  STUDIES: Nm Pet Image Initial (pi) Whole Body  07/01/2015  CLINICAL DATA:  Initial treatment strategy for malignant melanoma involving both axillary, and newly diagnosed breast cancer. EXAM: NUCLEAR MEDICINE PET WHOLE BODY TECHNIQUE: 5.91 mCi F-18 FDG was injected intravenously. CT data was obtained and used for attenuation correction and anatomic localization only. (This was not acquired as a diagnostic CT examination.) Additional exam technical data entered on technologist worksheet. FASTING BLOOD GLUCOSE:  Value: 98 mg/dl COMPARISON:  CT of the abdomen and pelvis 08/23/2006. FINDINGS: Head/Neck: No hypermetabolic lymph nodes in the neck. Chest: Partially calcified hypermetabolic (SUVmax = 6.6) soft tissue lesion in the medial aspect of the left breast (image 111 of series 4) measuring 2.6 x 2.0 cm. Hypermetabolic soft tissue lesions are noted deep in the soft tissues of the axilla bilaterally, just deep to latissimus dorsi musculature. On the right the lesion measures 2.9 x 1.9 cm (SUVmax = 16.9), and on the left the lesion measures 2.5 x 1.5 cm (SUVmax = 11.5). There is also a hypermetabolic (SUVmax = 10.6) soft tissue mass in the lateral aspect of the left latissimus dorsi musculature adjacent to the insertion on the left humerus, where the mass measures 4.4 x 3.4 cm (image 80 of series 4). There is a healing fracture of the proximal third of the left humeral diaphysis with surrounding soft tissue prominence and hypermetabolism, which could be related to normal healing, or could indicate a pathologic fracture. No definite suspicious appearing pulmonary nodules or masses.  Heart size is normal. No pericardial effusion. No hypermetabolic mediastinal, internal mammary or hilar lymphadenopathy. There is atherosclerosis of the thoracic aorta, the great vessels of the mediastinum and the coronary arteries, including calcified atherosclerotic plaque in the left main and left anterior descending coronary arteries. Severe calcifications of the mitral valve and mitral annulus. Moderate calcifications of the aortic valve. Abdomen/Pelvis: There are 2 foci of hypermetabolism in the left lobe of the liver, largest of which (SUVmax = 6.3) measures 8 x 13 mm (image 118 of series 4). No abnormal hypermetabolic activity within the pancreas, adrenal glands, or spleen. No hypermetabolic lymph nodes in the abdomen or pelvis. In the medial aspect of the upper left retroperitoneum there is a soft tissue attenuation lesion measuring 1.5 x 1.1 cm (image 114 of series 4) which is hypermetabolic (SUVmax = 8.2). This lesion appears most intimately associated with the superior aspect of the left [name], and is clearly separate from the left adrenal gland. Calcifications in the central aspect of segment 8 of the liver have a benign appearance and demonstrate no associated hypermetabolism. Well-defined 2.6 x 3.6 cm hypo attenuating lesion in the superior aspect of segment 7 of the liver demonstrates no internal hypermetabolism, presumably a cyst. Small calcified gallstones lie dependently in the gallbladder. 3.1 cm low-attenuation lesion in the upper pole of the left [name] also demonstrates no internal hypermetabolism, likely a cyst. Extensive atherosclerosis throughout the abdominal and pelvic vasculature, without definite aneurysm. Severe colonic diverticulosis, without surrounding inflammatory changes to suggest an acute diverticulitis at this time. Skeleton: Postoperative changes of left hip arthroplasty. Healing fracture of the proximal third of the left humeral diaphysis with surrounding hypermetabolism, as  discussed above. Old healed fracture of the proximal third of the right humeral diaphysis incidentally noted. Extremeties: In the distal aspect of the left femoral diaphysis there is a long segment of soft tissue attenuation replacing the marrow, best appreciated on images 256-270 of series 4. Within this region along the anterolateral aspect of the cortex (image 266 of series 4) there is  a small focus of sclerosis, which demonstrates some low-level hypermetabolism (SUVmax = 2.7). IMPRESSION: 1. Two large soft tissue attenuation foci of hypermetabolism in the deep axillary regions bilaterally, compatible with the reported sites of melanoma involvement. 2. Large soft tissue mass centered in the lateral aspect of the left latissimus dorsi musculature adjacent to the proximal third of the humeral diaphysis. This is presumably a metastatic lesion, either from breast cancer or melanoma. 3. 2.0 x 2.6 cm hypermetabolic lesion in the medial aspect of the left breast, corresponding to the patient's reported newly diagnosed breast cancer. 4. Two foci of hypermetabolism in the left lobe of the liver are presumably metastatic. In addition, there is a hypermetabolic focus in the distal third of the femoral diaphysis where there is extensive marrow replacement, concerning for an additional metastatic lesion. 5. Small exophytic soft tissue attenuation lesion demonstrates hypermetabolism and appears to be associated with the upper pole of the left [name], concerning for potential primary renal neoplasm. This could be further evaluated with MRI of the abdomen with and without IV gadolinium if clinically appropriate. 6. Severe colonic diverticulosis without evidence of acute diverticulitis at this time. 7. Extensive atherosclerosis, including left main and 2 vessel coronary artery disease. 8. Cholelithiasis without evidence of acute cholecystitis at this time. 9. There are calcifications of the aortic valve and mitral valve/annulus.  Echocardiographic correlation for evaluation of potential valvular dysfunction may be warranted if clinically indicated. Electronically Signed   By: Vinnie Langton M.D.   On: 07/01/2015 10:28    ASSESSMENT: 82 y.o. [place] woman with concurrent breast cancer and melanoma  BREAST CANCER: (1) left breast upper inner quadrant biopsy 06/06/2015 shows a clinical T2 NX, stage II invasive ductal carcinoma , grade 2, estrogen receptor and progesterone receptor strongly positive, with an MIB-1 of 5%, and no HER-2 amplification, the signals ratio being 1.42 and the number per cell 2.55.   (2) anastrozole started 07/01/2015  MALIGNANT MELANOMA: (a) shave biopsy from the mid upper back 04/17/2015 shows invasive malignant melanoma, Breslow depth 6.5 mm plus, Clark's level 4+, with 23 mitoses per square millimeter, extensive ulceration,, with involved edges, and non-brisk host response  (i) biopsy of right and left axillary lymph nodes 06/06/2015 showed metastatic melanoma, BRAF wild type  (ii) PET scan every 12/19/2015 shows bilateral axillary adenopathy, a left latissimus mass, a left pararenal mass, and 2 liver lesions  (b) pembrolizumab to be started 07/16/2015, repeated every 21 days   PLAN: I spent approximately 40 minutes with Danielle [name] and her daughter today going over her situation. As far as the breast cancer is concerned the plan is to continue anastrozole. We will reimage the breast after 3 and or 6 months the expectation being the mass will shrink. At some point if we have made significant headway on the melanoma front we will proceed to lumpectomy.  We spent most of today's visit discussing the melanoma issue. She understands this is a stage IV tumor which is not curable. We checked for the [place] F mutation, which would've given [place] some targeted therapies, but this was negative. Accordingly we are going with immunotherapy.  Today we reviewed pembrolizumab in detail. They have a good  understanding of the possible toxicities, side effects and complications of this agent. They receive this information in writing. They understand there are some, and side effects which arm more of a nuisance than anything else, but then some on, and side effects which may be life-threatening. I have urged Taylore to let us  know anything that is different so that we can interrupt excessive immune activity if necessary (with steroids of course).  Otherwise she will return on femora 14 2017 for her first pembrolizumab dose. She will have her brain MRI that same evening. She will see me the next week just to make sure everything is going as scheduled and then she will return to see [place] with her second dose. We will follow her TSH on an every 6 week basis. After 3 months we will repeat a chest CT scan to assess for response, but she has measurable operable disease in both axillae and that certainly can give [place] some indication of which where we are having.  Kayte has a good understanding of this plan. She is very much in agreement with it. She knows to call for any problems that may develop before her next visit here.  Chauncey Cruel, MD   07/12/2015 2:12 PM Medical Oncology and Hematology [facility] [address] [place], [place] [address] Tel. [phone]    Fax. [phone]

## 2015-07-13 NOTE — Telephone Encounter (Signed)
Spoke with patient dtr and she is aware that bone density test that she was waiting on is on the printed avs report from 2/10 - scheduled for 2/23. Additional tx appointments will be added at next visit.

## 2015-07-15 ENCOUNTER — Other Ambulatory Visit: Payer: Self-pay

## 2015-07-15 ENCOUNTER — Telehealth: Payer: Self-pay | Admitting: Oncology

## 2015-07-15 ENCOUNTER — Other Ambulatory Visit: Payer: Self-pay | Admitting: Oncology

## 2015-07-15 ENCOUNTER — Other Ambulatory Visit: Payer: Self-pay | Admitting: Pharmacist

## 2015-07-15 DIAGNOSIS — C50212 Malignant neoplasm of upper-inner quadrant of left female breast: Secondary | ICD-10-CM

## 2015-07-15 DIAGNOSIS — C439 Malignant melanoma of skin, unspecified: Secondary | ICD-10-CM

## 2015-07-15 DIAGNOSIS — C438 Malignant melanoma of overlapping sites of skin: Secondary | ICD-10-CM

## 2015-07-15 DIAGNOSIS — C787 Secondary malignant neoplasm of liver and intrahepatic bile duct: Secondary | ICD-10-CM

## 2015-07-15 NOTE — Telephone Encounter (Signed)
S/w pt's daughter confirming labs added before next infusion per 02/13 POF... KJ

## 2015-07-16 ENCOUNTER — Other Ambulatory Visit (HOSPITAL_BASED_OUTPATIENT_CLINIC_OR_DEPARTMENT_OTHER): Payer: Medicare HMO

## 2015-07-16 ENCOUNTER — Other Ambulatory Visit: Payer: Self-pay | Admitting: Oncology

## 2015-07-16 ENCOUNTER — Ambulatory Visit (HOSPITAL_COMMUNITY)
Admission: RE | Admit: 2015-07-16 | Discharge: 2015-07-16 | Disposition: A | Discharge status: Home / Self Care | Payer: Medicare HMO | Source: Ambulatory Visit | Attending: Oncology | Admitting: Oncology

## 2015-07-16 ENCOUNTER — Ambulatory Visit (HOSPITAL_BASED_OUTPATIENT_CLINIC_OR_DEPARTMENT_OTHER): Payer: Medicare HMO

## 2015-07-16 VITALS — BP 168/59 | HR 64 | Temp 97.8°F | Resp 18

## 2015-07-16 DIAGNOSIS — C439 Malignant melanoma of skin, unspecified: Secondary | ICD-10-CM

## 2015-07-16 DIAGNOSIS — Z5112 Encounter for antineoplastic immunotherapy: Secondary | ICD-10-CM | POA: Diagnosis not present

## 2015-07-16 DIAGNOSIS — C4359 Malignant melanoma of other part of trunk: Secondary | ICD-10-CM

## 2015-07-16 DIAGNOSIS — I739 Peripheral vascular disease, unspecified: Secondary | ICD-10-CM | POA: Insufficient documentation

## 2015-07-16 DIAGNOSIS — C773 Secondary and unspecified malignant neoplasm of axilla and upper limb lymph nodes: Secondary | ICD-10-CM

## 2015-07-16 DIAGNOSIS — C50019 Malignant neoplasm of nipple and areola, unspecified female breast: Secondary | ICD-10-CM | POA: Diagnosis present

## 2015-07-16 DIAGNOSIS — C438 Malignant melanoma of overlapping sites of skin: Secondary | ICD-10-CM

## 2015-07-16 LAB — COMPREHENSIVE METABOLIC PANEL
ALBUMIN: 4 g/dL (ref 3.5–5.0)
ALK PHOS: 58 U/L (ref 40–150)
ALT: 15 U/L (ref 0–55)
ANION GAP: 10 meq/L (ref 3–11)
AST: 23 U/L (ref 5–34)
BILIRUBIN TOTAL: 0.36 mg/dL (ref 0.20–1.20)
BUN: 24.7 mg/dL (ref 7.0–26.0)
CALCIUM: 10 mg/dL (ref 8.4–10.4)
CO2: 24 mEq/L (ref 22–29)
Chloride: 105 mEq/L (ref 98–109)
Creatinine: 0.7 mg/dL (ref 0.6–1.1)
EGFR: 74 mL/min/{1.73_m2} — AB (ref >90)
GLUCOSE: 85 mg/dL (ref 70–140)
POTASSIUM: 4.1 meq/L (ref 3.5–5.1)
Sodium: 140 mEq/L (ref 136–145)
TOTAL PROTEIN: 7.1 g/dL (ref 6.4–8.3)

## 2015-07-16 LAB — CBC WITH DIFFERENTIAL/PLATELET
BASO%: 1 % (ref 0.0–2.0)
BASOS ABS: 0.1 10*3/uL (ref 0.0–0.1)
EOS ABS: 0.1 10*3/uL (ref 0.0–0.5)
EOS%: 1.4 % (ref 0.0–7.0)
HEMATOCRIT: 38.9 % (ref 34.8–46.6)
HEMOGLOBIN: 12.6 g/dL (ref 11.6–15.9)
LYMPH%: 12.6 % — ABNORMAL LOW (ref 14.0–49.7)
MCH: 27.6 pg (ref 25.1–34.0)
MCHC: 32.3 g/dL (ref 31.5–36.0)
MCV: 85.5 fL (ref 79.5–101.0)
MONO#: 0.7 10*3/uL (ref 0.1–0.9)
MONO%: 8.3 % (ref 0.0–14.0)
NEUT%: 76.7 % (ref 38.4–76.8)
NEUTROS ABS: 6.2 10*3/uL (ref 1.5–6.5)
PLATELETS: 251 10*3/uL (ref 145–400)
RBC: 4.55 10*6/uL (ref 3.70–5.45)
RDW: 13.4 % (ref 11.2–14.5)
WBC: 8 10*3/uL (ref 3.9–10.3)
lymph#: 1 10*3/uL (ref 0.9–3.3)

## 2015-07-16 MED ORDER — SODIUM CHLORIDE 0.9 % IV SOLN
Freq: Once | INTRAVENOUS | Status: AC
  Administered 2015-07-16: 13:00:00 via INTRAVENOUS

## 2015-07-16 MED ORDER — GADOBENATE DIMEGLUMINE 529 MG/ML IV SOLN
10.0000 mL | Freq: Once | INTRAVENOUS | Status: AC | PRN
  Administered 2015-07-16: 10 mL via INTRAVENOUS

## 2015-07-16 MED ORDER — SODIUM CHLORIDE 0.9 % IV SOLN
2.0000 mg/kg | Freq: Once | INTRAVENOUS | Status: AC
  Administered 2015-07-16: 100 mg via INTRAVENOUS
  Filled 2015-07-16: qty 4

## 2015-07-16 NOTE — Patient Instructions (Signed)
Pembrolizumab injection What is this medicine? PEMBROLIZUMAB (pem broe liz ue mab) is a monoclonal antibody. It is used to treat melanoma and non-small cell lung cancer. This medicine may be used for other purposes; ask your health care provider or pharmacist if you have questions. What should I tell my health care provider before I take this medicine? They need to know if you have any of these conditions: -diabetes -immune system problems -inflammatory bowel disease -liver disease -lung or breathing disease -lupus -an unusual or allergic reaction to pembrolizumab, other medicines, foods, dyes, or preservatives -pregnant or trying to get pregnant -breast-feeding How should I use this medicine? This medicine is for infusion into a vein. It is given by a health care professional in a hospital or clinic setting. A special MedGuide will be given to you before each treatment. Be sure to read this information carefully each time. Talk to your pediatrician regarding the use of this medicine in children. Special care may be needed. Overdosage: If you think you have taken too much of this medicine contact a poison control center or emergency room at once. NOTE: This medicine is only for you. Do not share this medicine with others. What if I miss a dose? It is important not to miss your dose. Call your doctor or health care professional if you are unable to keep an appointment. What may interact with this medicine? Interactions have not been studied. Give your health care provider a list of all the medicines, herbs, non-prescription drugs, or dietary supplements you use. Also tell them if you smoke, drink alcohol, or use illegal drugs. Some items may interact with your medicine. This list may not describe all possible interactions. Give your health care provider a list of all the medicines, herbs, non-prescription drugs, or dietary supplements you use. Also tell them if you smoke, drink alcohol, or  use illegal drugs. Some items may interact with your medicine. What should I watch for while using this medicine? Your condition will be monitored carefully while you are receiving this medicine. You may need blood work done while you are taking this medicine. Do not become pregnant while taking this medicine or for 4 months after stopping it. Women should inform their doctor if they wish to become pregnant or think they might be pregnant. There is a potential for serious side effects to an unborn child. Talk to your health care professional or pharmacist for more information. Do not breast-feed an infant while taking this medicine or for 4 months after the last dose. What side effects may I notice from receiving this medicine? Side effects that you should report to your doctor or health care professional as soon as possible: -allergic reactions like skin rash, itching or hives, swelling of the face, lips, or tongue -bloody or black, tarry stools -breathing problems -change in the amount of urine -changes in vision -chest pain -chills -dark urine -dizziness or feeling faint or lightheaded -fast or irregular heartbeat -fever -flushing -hair loss -muscle pain -muscle weakness -persistent headache -signs and symptoms of high blood sugar such as dizziness; dry mouth; dry skin; fruity breath; nausea; stomach pain; increased hunger or thirst; increased urination -signs and symptoms of liver injury like dark urine, light-colored stools, loss of appetite, nausea, right upper belly pain, yellowing of the eyes or skin -stomach pain -weight loss Side effects that usually do not require medical attention (Report these to your doctor or health care professional if they continue or are bothersome.):constipation -cough -diarrhea -joint pain -  tiredness This list may not describe all possible side effects. Call your doctor for medical advice about side effects. You may report side effects to FDA at  1-800-FDA-1088. Where should I keep my medicine? This drug is given in a hospital or clinic and will not be stored at home. NOTE: This sheet is a summary. It may not cover all possible information. If you have questions about this medicine, talk to your doctor, pharmacist, or health care provider.    2016, Elsevier/Gold Standard. (2014-07-17 17:24:19)  

## 2015-07-24 ENCOUNTER — Other Ambulatory Visit (HOSPITAL_BASED_OUTPATIENT_CLINIC_OR_DEPARTMENT_OTHER): Payer: Medicare HMO

## 2015-07-24 ENCOUNTER — Telehealth: Payer: Self-pay | Admitting: Oncology

## 2015-07-24 ENCOUNTER — Ambulatory Visit (HOSPITAL_BASED_OUTPATIENT_CLINIC_OR_DEPARTMENT_OTHER): Payer: Medicare HMO | Admitting: Oncology

## 2015-07-24 VITALS — BP 142/57 | HR 70 | Temp 98.0°F | Resp 18 | Ht 64.0 in | Wt 117.9 lb

## 2015-07-24 DIAGNOSIS — C50212 Malignant neoplasm of upper-inner quadrant of left female breast: Secondary | ICD-10-CM | POA: Diagnosis not present

## 2015-07-24 DIAGNOSIS — Z170 Estrogen receptor positive status [ER+]: Secondary | ICD-10-CM | POA: Diagnosis not present

## 2015-07-24 DIAGNOSIS — C439 Malignant melanoma of skin, unspecified: Secondary | ICD-10-CM

## 2015-07-24 DIAGNOSIS — C438 Malignant melanoma of overlapping sites of skin: Secondary | ICD-10-CM

## 2015-07-24 DIAGNOSIS — C787 Secondary malignant neoplasm of liver and intrahepatic bile duct: Secondary | ICD-10-CM

## 2015-07-24 DIAGNOSIS — C773 Secondary and unspecified malignant neoplasm of axilla and upper limb lymph nodes: Secondary | ICD-10-CM

## 2015-07-24 DIAGNOSIS — C4359 Malignant melanoma of other part of trunk: Secondary | ICD-10-CM

## 2015-07-24 DIAGNOSIS — Z79811 Long term (current) use of aromatase inhibitors: Secondary | ICD-10-CM

## 2015-07-24 LAB — COMPREHENSIVE METABOLIC PANEL
ALBUMIN: 3.9 g/dL (ref 3.5–5.0)
ALK PHOS: 65 U/L (ref 40–150)
ALT: 14 U/L (ref 0–55)
AST: 23 U/L (ref 5–34)
Anion Gap: 9 mEq/L (ref 3–11)
BILIRUBIN TOTAL: 0.31 mg/dL (ref 0.20–1.20)
BUN: 21.2 mg/dL (ref 7.0–26.0)
CALCIUM: 10.6 mg/dL — AB (ref 8.4–10.4)
CO2: 28 mEq/L (ref 22–29)
CREATININE: 0.7 mg/dL (ref 0.6–1.1)
Chloride: 105 mEq/L (ref 98–109)
EGFR: 72 mL/min/{1.73_m2} — ABNORMAL LOW (ref >90)
GLUCOSE: 102 mg/dL (ref 70–140)
Potassium: 4.4 mEq/L (ref 3.5–5.1)
SODIUM: 142 meq/L (ref 136–145)
TOTAL PROTEIN: 7.1 g/dL (ref 6.4–8.3)

## 2015-07-24 LAB — CBC WITH DIFFERENTIAL/PLATELET
BASO%: 0.7 % (ref 0.0–2.0)
Basophils Absolute: 0.1 10*3/uL (ref 0.0–0.1)
EOS%: 0.9 % (ref 0.0–7.0)
Eosinophils Absolute: 0.1 10*3/uL (ref 0.0–0.5)
HEMATOCRIT: 38.3 % (ref 34.8–46.6)
HEMOGLOBIN: 12.5 g/dL (ref 11.6–15.9)
LYMPH#: 1 10*3/uL (ref 0.9–3.3)
LYMPH%: 13.3 % — ABNORMAL LOW (ref 14.0–49.7)
MCH: 28 pg (ref 25.1–34.0)
MCHC: 32.6 g/dL (ref 31.5–36.0)
MCV: 85.7 fL (ref 79.5–101.0)
MONO#: 0.7 10*3/uL (ref 0.1–0.9)
MONO%: 9.4 % (ref 0.0–14.0)
NEUT%: 75.7 % (ref 38.4–76.8)
NEUTROS ABS: 5.7 10*3/uL (ref 1.5–6.5)
NRBC: 0 % (ref 0–0)
Platelets: 272 10*3/uL (ref 145–400)
RBC: 4.47 10*6/uL (ref 3.70–5.45)
RDW: 13.2 % (ref 11.2–14.5)
WBC: 7.5 10*3/uL (ref 3.9–10.3)

## 2015-07-24 NOTE — Telephone Encounter (Signed)
appts made and avs printed °

## 2015-07-24 NOTE — Progress Notes (Signed)
[place]  Telephone:(336) [phone] Fax:(336) [phone]     ID: Danielle [name] DOB: [birth year]  MR#: [id number]  SVX#:[id number]  Patient Care Team: Bartholome Bill, MD as PCP - General (Family Medicine) Armandina Gemma, MD as Consulting Physician (General Surgery) Chauncey Cruel, MD as Consulting Physician (Oncology) Allyn Kenner, MD (Dermatology) Gaynelle Arabian, MD as Consulting Physician (Orthopedic Surgery) PCP: Bartholome Bill, MD GYN: OTHER MD:  CHIEF COMPLAINT: Estrogen receptor positive breast cancer; stage IV malignant melanoma  CURRENT TREATMENT: Anastrozole, pembrolizumab   BREAST CANCER HISTORY: From the original intake note:  [name] she had an itchy spot in the middle of her back which she scratched sometimes, but this was not evaluated until [facility] fell off a chair while changing a light bulb and broke her left upper extremity. Because she couldn't change while in a sling, her daughter Danielle [name] had 2 give her a hand and in the process noted a black lesion in the middle of Danielle [name]'s back. She took married to Dr. [name] office where on 04/17/2015 he performed a shave biopsy of a pigmented lesion over the spine in the upper back. The pathology report ([id number]) showed a breast low depth of at least 6.5 mm, with the anatomic level at least 4. There were 23 mitoses per square millimeter and extensive ulceration. There was no satellitosis. There was no evidence of regression. They host response was not brisk. This was read as a pathology stage T4b and the patient was referred to [name] for wide excision.  However on exam Dr. jerking noted bilateral axillary adenopathy and also a mass in the upper inner quadrant of the left breast. He set [name] up for bilateral mammography with tomography 06/06/2015, and this showed an irregular hyperdense mass in the left breast upper inner quadrant which was palpable. There was also a firm palpable mass in the left axilla as well as in  the contralateral, right axilla. Ultrasonography showed the left breast mass to measure 2.7 cm and at least 2 abnormally enlarged lymph nodes in the left axilla the larger one measuring 3.9 cm. In the right axilla there were also 2 abnormal appearing lymph nodes, the larger measuring 2.6 cm.  On 06/06/2015 Nichola underwent biopsy of the left breast mass and both axillary adenopathy areas. The left breast mass proved to be an invasive ductal carcinoma , grade 2, estrogen and progesterone receptor positive, both at 100%, with strong staining intensity. HER-2 was not amplified, with a signals ratio of 1.42, the average copy number per cell being 2.55. The proliferation marker was 5%.  More importantly, both axillary lymph nodes showed metastatic melanoma. We were contacted and prior to today's visit we obtained a PET scan, which shows in addition to the left breast mass and the bilateral axillary lymph nodes (described as just deep to the latissimus dorsi muscles bilaterally) 2 hypermetabolic foci in the left lobe of the liver, measuring 1.3 and 0.8 cm, with an SUV max of 6.3. A 1.5 cm mass next to the left kidney, clearly separate from the adrenal gland, is also hypermetabolic with an SUV max of 8.2. There were no lung or bone lesions.  [place] subsequent history is as detailed below   INTERVAL HISTORY: Danielle [name] returns today for follow-up of her melanoma and breast cancers accompanied by her daughter Danielle [name]. Today is day 9 cycle 1 of pembrolizumab. She tolerated that remarkably well. She has had no rash, no diarrhea, no fatigue, no hair loss, no cough, no fever, and  really no symptoms to report.  She is also on anastrozole. She does not have hot flashes, vaginal dryness, or more arthralgias or myalgias than she did before starting that medication. She obtains the anastrozole at less than $10 a month.  Since her last visit here she also had her brain MRI which showed no evidence of metastatic disease  there.  REVIEW OF SYSTEMS: Carleta remains normally active. She complains of knee pain. This limits her ability to walk. She is very busy doing her taxes. She is driving. They have not been any significant symptoms related to her treatment and in particular she denies rash, diarrhea, unusual fatigue, or fever. There has been no evidence of colitis, dermatitis, pneumonitis, thyroiditis, or adrenal insufficiency. There is no laboratory evidence of hepatitis or pancreatitis, and there is no motor or sensory neuropathy. She has baseline arthritis but this is not more persistent or intense than before. A detailed review of systems was otherwise stable  PAST MEDICAL HISTORY: No past medical history on file. Remote history of rectal prolapse with chronic constipation resulting, history of left humeral fracture, osteopenia  PAST SURGICAL HISTORY: Past Surgical History  Procedure Laterality Date  . Hip surgery      FAMILY HISTORY No family history on file. The patient's father died at the age of 61 in the setting of a call abuse. The patient's mother died from "old age" at 91. The patient had 4 brothers, no sisters. One brother died in his 32s from brain cancer. Another died from a stroke, and other from emphysema. The 56, youngest brother is 74 and jogs every day. There is no history of breast or ovarian cancer in the family. There is also no prior history of melanoma.  GYNECOLOGIC HISTORY:  No LMP recorded. Patient is postmenopausal. Menarche age 36, first live birth age 24, the patient is [place] P4. She stopped having periods in her early 76s. She did not use hormone replacement. She never took oral contraceptives.  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 She is widowed. She is now retired, and lives alone, with no pets. Her son Danielle [name] lives in [place], and is a retired Optometrist. Son Danielle [name] lives in [place] and is a Armed forces logistics/support/administrative officer. Daughter Danielle [name] is a Marine scientist at [employer] locally. The fourth child, [name], died in an automobile accident at age 20. The patient has 4 grandchildren, no great grandchildren. She is a Corporate treasurer. She attends services at [employer].    ADVANCED DIRECTIVES: Not yet notarized; Cherika intends to name her daughter Danielle [name] as her healthcare power of attorney. She can be reached at [phone]   HEALTH MAINTENANCE: Social History  Substance Use Topics  . Smoking status: Never Smoker   . Smokeless tobacco: Not on file  . Alcohol Use: No     Colonoscopy: Remote  PAP:  Bone density: Remote  Lipid panel:  Allergies  Allergen Reactions  . Vicodin [Hydrocodone-Acetaminophen] Nausea Only    Current Outpatient Prescriptions  Medication Sig Dispense Refill  . anastrozole (ARIMIDEX) 1 MG tablet Take 1 tablet (1 mg total) by mouth daily. 90 tablet 4  . aspirin EC 81 MG tablet Take by mouth.    . enalapril (VASOTEC) 20 MG tablet Take 20 mg by mouth 2 (two) times daily.     . Lutein 20 MG CAPS Take by mouth.    . ondansetron (ZOFRAN ODT) 8 MG disintegrating tablet  Take 1 tablet (8 mg total) by mouth every 8 (eight) hours as needed for nausea or vomiting. (Patient not taking: Reported on 07/01/2015) 15 tablet 0  . Red Yeast Rice Extract (RED YEAST RICE PO) Take by mouth.    . triamterene-hydrochlorothiazide (DYAZIDE) 37.5-25 MG capsule Take 1 capsule by mouth daily.     . verapamil (CALAN-SR) 240 MG CR tablet   0   No current facility-administered medications for this visit.    OBJECTIVE: Older white woman in no acute distress Filed Vitals:   07/24/15 1220  BP: 142/57  Pulse: 70  Temp: 98 F (36.7 C)  Resp: 18     Body mass index is 20.23 kg/(m^2).    ECOG FS:1 - Symptomatic but completely ambulatory  Sclerae unicteric, EOMs intact Oropharynx clear, dentition in good repair No cervical or supraclavicular adenopathy; there is bilateral axillary adenopathy as follows:  On the right, 2 separate masses measuring approximately 1-1/2 cm each, on the left, a deep 2 cm mass Lungs no rales or rhonchi Heart regular rate and rhythm Abd soft, nontender, positive bowel sounds MSK no focal spinal tenderness, no upper extremity lymphedema Neuro: nonfocal, well oriented, appropriate affect Breasts: deferred  LAB RESULTS:  CMP     Component Value Date/Time   NA 142 07/24/2015 1135   NA 134* 07/30/2006 1318   K 4.4 07/24/2015 1135   K 4.3 07/30/2006 1318   CL 100 07/30/2006 1318   CO2 28 07/24/2015 1135   CO2 24 07/30/2006 1318   GLUCOSE 102 07/24/2015 1135   GLUCOSE 102* 07/30/2006 1318   BUN 21.2 07/24/2015 1135   BUN 19 07/30/2006 1318   CREATININE 0.7 07/24/2015 1135   CREATININE 0.7 07/30/2006 1318   CALCIUM 10.6* 07/24/2015 1135   CALCIUM 9.4 07/30/2006 1318   PROT 7.1 07/24/2015 1135   ALBUMIN 3.9 07/24/2015 1135   AST 23 07/24/2015 1135   ALT 14 07/24/2015 1135   ALKPHOS 65 07/24/2015 1135   BILITOT 0.31 07/24/2015 1135   GFRNONAA 86 07/30/2006 1318   GFRAA 104 07/30/2006 1318    INo results found for: SPEP, UPEP  Lab Results  Component Value Date   WBC 7.5 07/24/2015   NEUTROABS 5.7 07/24/2015   HGB 12.5 07/24/2015   HCT 38.3 07/24/2015   MCV 85.7 07/24/2015   PLT 272 07/24/2015      Chemistry      Component Value Date/Time   NA 142 07/24/2015 1135   NA 134* 07/30/2006 1318   K 4.4 07/24/2015 1135   K 4.3 07/30/2006 1318   CL 100 07/30/2006 1318   CO2 28 07/24/2015 1135   CO2 24 07/30/2006 1318   BUN 21.2 07/24/2015 1135   BUN 19 07/30/2006 1318   CREATININE 0.7 07/24/2015 1135   CREATININE 0.7 07/30/2006 1318      Component Value Date/Time   CALCIUM 10.6* 07/24/2015 1135   CALCIUM 9.4 07/30/2006 1318   ALKPHOS 65 07/24/2015 1135   AST 23 07/24/2015 1135   ALT 14 07/24/2015 1135   BILITOT 0.31 07/24/2015 1135       No results found for: LABCA2  No components found for: LABCA125  No results for input(s): INR  in the last 168 hours.  Urinalysis No results found for: COLORURINE, APPEARANCEUR, LABSPEC, PHURINE, GLUCOSEU, HGBUR, BILIRUBINUR, KETONESUR, PROTEINUR, UROBILINOGEN, NITRITE, LEUKOCYTESUR  STUDIES: Mr Kizzie Fantasia Contrast  08/11/15  CLINICAL DATA:  83 year old female with stage IV melanoma. Decreased hearing. Subsequent encounter. EXAM: MRI HEAD WITHOUT  AND WITH CONTRAST TECHNIQUE: Multiplanar, multiecho pulse sequences of the brain and surrounding structures were obtained without and with intravenous contrast. CONTRAST:  42m MULTIHANCE GADOBENATE DIMEGLUMINE 529 MG/ML IV SOLN COMPARISON:  PET-CT 07/01/2015 FINDINGS: No midline shift, mass effect, or evidence of intracranial mass lesion. No abnormal enhancement identified. No dural thickening identified. Cerebral volume is within normal limits for age. No restricted diffusion to suggest acute infarction. No ventriculomegaly, extra-axial collection or acute intracranial hemorrhage. Cervicomedullary junction and pituitary are within normal limits. Major intracranial vascular flow voids are preserved, dominant appearing distal left vertebral artery. Mild to moderate for age scattered cerebral white matter T2 and FLAIR hyperintensity without enhancement. Mild for age involvement of the deep gray matter nuclei, although there is evidence of a small chronic lacunar infarct in the right thalamus. Mild involvement of the pons. No cortical encephalomalacia or cerebral blood products identified. Negative visualized cervical spine and spinal cord. Visible bone marrow signal is normal. Orbit and scalp soft tissues are within normal limits. Mildly conspicuous but nonenlarged left level 2 lymph node on series 13, image 17. Paranasal sinuses and mastoids are clear. Visible internal auditory structures appear normal. Tympanic cavities appear clear. IMPRESSION: 1. No acute or metastatic intracranial abnormality. No explanation for acute hearing loss identified. 2. Mild to  moderate for age signal changes in the brain most commonly due to chronic small vessel disease. Electronically Signed   By: HGenevie AnnM.D.   On: 07/17/2015 07:36   Nm Pet Image Initial (pi) Whole Body  07/01/2015  CLINICAL DATA:  Initial treatment strategy for malignant melanoma involving both axillary, and newly diagnosed breast cancer. EXAM: NUCLEAR MEDICINE PET WHOLE BODY TECHNIQUE: 5.91 mCi F-18 FDG was injected intravenously. CT data was obtained and used for attenuation correction and anatomic localization only. (This was not acquired as a diagnostic CT examination.) Additional exam technical data entered on technologist worksheet. FASTING BLOOD GLUCOSE:  Value: 98 mg/dl COMPARISON:  CT of the abdomen and pelvis 08/23/2006. FINDINGS: Head/Neck: No hypermetabolic lymph nodes in the neck. Chest: Partially calcified hypermetabolic (SUVmax = 6.6) soft tissue lesion in the medial aspect of the left breast (image 111 of series 4) measuring 2.6 x 2.0 cm. Hypermetabolic soft tissue lesions are noted deep in the soft tissues of the axilla bilaterally, just deep to latissimus dorsi musculature. On the right the lesion measures 2.9 x 1.9 cm (SUVmax = 16.9), and on the left the lesion measures 2.5 x 1.5 cm (SUVmax = 11.5). There is also a hypermetabolic (SUVmax = 234.1 soft tissue mass in the lateral aspect of the left latissimus dorsi musculature adjacent to the insertion on the left humerus, where the mass measures 4.4 x 3.4 cm (image 80 of series 4). There is a healing fracture of the proximal third of the left humeral diaphysis with surrounding soft tissue prominence and hypermetabolism, which could be related to normal healing, or could indicate a pathologic fracture. No definite suspicious appearing pulmonary nodules or masses. Heart size is normal. No pericardial effusion. No hypermetabolic mediastinal, internal mammary or hilar lymphadenopathy. There is atherosclerosis of the thoracic aorta, the great vessels of  the mediastinum and the coronary arteries, including calcified atherosclerotic plaque in the left main and left anterior descending coronary arteries. Severe calcifications of the mitral valve and mitral annulus. Moderate calcifications of the aortic valve. Abdomen/Pelvis: There are 2 foci of hypermetabolism in the left lobe of the liver, largest of which (SUVmax = 6.3) measures 8 x 13 mm (image 118 of  series 4). No abnormal hypermetabolic activity within the pancreas, adrenal glands, or spleen. No hypermetabolic lymph nodes in the abdomen or pelvis. In the medial aspect of the upper left retroperitoneum there is a soft tissue attenuation lesion measuring 1.5 x 1.1 cm (image 114 of series 4) which is hypermetabolic (SUVmax = 8.2). This lesion appears most intimately associated with the superior aspect of the left kidney, and is clearly separate from the left adrenal gland. Calcifications in the central aspect of segment 8 of the liver have a benign appearance and demonstrate no associated hypermetabolism. Well-defined 2.6 x 3.6 cm hypo attenuating lesion in the superior aspect of segment 7 of the liver demonstrates no internal hypermetabolism, presumably a cyst. Small calcified gallstones lie dependently in the gallbladder. 3.1 cm low-attenuation lesion in the upper pole of the left kidney also demonstrates no internal hypermetabolism, likely a cyst. Extensive atherosclerosis throughout the abdominal and pelvic vasculature, without definite aneurysm. Severe colonic diverticulosis, without surrounding inflammatory changes to suggest an acute diverticulitis at this time. Skeleton: Postoperative changes of left hip arthroplasty. Healing fracture of the proximal third of the left humeral diaphysis with surrounding hypermetabolism, as discussed above. Old healed fracture of the proximal third of the right humeral diaphysis incidentally noted. Extremeties: In the distal aspect of the left femoral diaphysis there is a long  segment of soft tissue attenuation replacing the marrow, best appreciated on images 256-270 of series 4. Within this region along the anterolateral aspect of the cortex (image 266 of series 4) there is a small focus of sclerosis, which demonstrates some low-level hypermetabolism (SUVmax = 2.7). IMPRESSION: 1. Two large soft tissue attenuation foci of hypermetabolism in the deep axillary regions bilaterally, compatible with the reported sites of melanoma involvement. 2. Large soft tissue mass centered in the lateral aspect of the left latissimus dorsi musculature adjacent to the proximal third of the humeral diaphysis. This is presumably a metastatic lesion, either from breast cancer or melanoma. 3. 2.0 x 2.6 cm hypermetabolic lesion in the medial aspect of the left breast, corresponding to the patient's reported newly diagnosed breast cancer. 4. Two foci of hypermetabolism in the left lobe of the liver are presumably metastatic. In addition, there is a hypermetabolic focus in the distal third of the femoral diaphysis where there is extensive marrow replacement, concerning for an additional metastatic lesion. 5. Small exophytic soft tissue attenuation lesion demonstrates hypermetabolism and appears to be associated with the upper pole of the left kidney, concerning for potential primary renal neoplasm. This could be further evaluated with MRI of the abdomen with and without IV gadolinium if clinically appropriate. 6. Severe colonic diverticulosis without evidence of acute diverticulitis at this time. 7. Extensive atherosclerosis, including left main and 2 vessel coronary artery disease. 8. Cholelithiasis without evidence of acute cholecystitis at this time. 9. There are calcifications of the aortic valve and mitral valve/annulus. Echocardiographic correlation for evaluation of potential valvular dysfunction may be warranted if clinically indicated. Electronically Signed   By: Vinnie Langton M.D.   On: 07/01/2015  10:28    ASSESSMENT: 82 y.o. Normal woman with concurrent breast cancer and melanoma  BREAST CANCER: (1) left breast upper inner quadrant biopsy 06/06/2015 shows a clinical T2 NX, stage II invasive ductal carcinoma , grade 2, estrogen receptor and progesterone receptor strongly positive, with an MIB-1 of 5%, and no HER-2 amplification, the signals ratio being 1.42 and the number per cell 2.55.   (2) anastrozole started 07/01/2015  MALIGNANT MELANOMA: (a) shave biopsy from  the mid upper back 04/17/2015 shows invasive malignant melanoma, Breslow depth 6.5 mm plus, Clark's level 4+, with 23 mitoses per square millimeter, extensive ulceration,, with involved edges, and non-brisk host response  (i) biopsy of right and left axillary lymph nodes 06/06/2015 showed metastatic melanoma, BRAF wild type  (ii) PET scan every 12/19/2015 shows bilateral axillary adenopathy, a left latissimus mass, a left pararenal mass, and 2 liver lesions  (b) pembrolizumab started 07/16/2015, repeated every 21 days   PLAN: Ronne did remarkably well with her first pembrolizumab dose. Her counts also are holding up. She is going to return 08/06/2015 for her second cycle and she will see me that day area we are going to continue to follow her axillary adenopathy for now. After 3-4 cycles we will repeat a PET scan.  She is having no significant access problems and therefore we're not placing a port at this point.  The plan as far as the anastrozole is concern is to continue that for 5 years. She will need a bone density before the end of the year  Tava knows to call for any problems that may develop before her next visit here.   Chauncey Cruel, MD   07/24/2015 12:43 PM Medical Oncology and Hematology [facility] [address] [place], [place] [address] Tel. [phone]    Fax. [phone]

## 2015-07-25 ENCOUNTER — Ambulatory Visit
Admission: RE | Admit: 2015-07-25 | Discharge: 2015-07-25 | Disposition: A | Discharge status: Home / Self Care | Payer: Medicare HMO | Source: Ambulatory Visit | Attending: Oncology | Admitting: Oncology

## 2015-07-25 DIAGNOSIS — C438 Malignant melanoma of overlapping sites of skin: Secondary | ICD-10-CM

## 2015-07-25 DIAGNOSIS — C439 Malignant melanoma of skin, unspecified: Secondary | ICD-10-CM

## 2015-07-25 DIAGNOSIS — I700 Atherosclerosis of aorta: Secondary | ICD-10-CM

## 2015-07-25 DIAGNOSIS — C787 Secondary malignant neoplasm of liver and intrahepatic bile duct: Secondary | ICD-10-CM

## 2015-07-25 DIAGNOSIS — M8580 Other specified disorders of bone density and structure, unspecified site: Secondary | ICD-10-CM

## 2015-07-25 DIAGNOSIS — K5730 Diverticulosis of large intestine without perforation or abscess without bleeding: Secondary | ICD-10-CM

## 2015-07-25 DIAGNOSIS — C50019 Malignant neoplasm of nipple and areola, unspecified female breast: Secondary | ICD-10-CM

## 2015-07-25 DIAGNOSIS — C50212 Malignant neoplasm of upper-inner quadrant of left female breast: Secondary | ICD-10-CM

## 2015-08-06 ENCOUNTER — Other Ambulatory Visit: Payer: Medicare HMO

## 2015-08-06 ENCOUNTER — Ambulatory Visit (HOSPITAL_BASED_OUTPATIENT_CLINIC_OR_DEPARTMENT_OTHER): Payer: Medicare HMO | Admitting: Oncology

## 2015-08-06 ENCOUNTER — Other Ambulatory Visit (HOSPITAL_BASED_OUTPATIENT_CLINIC_OR_DEPARTMENT_OTHER): Payer: Medicare HMO

## 2015-08-06 ENCOUNTER — Ambulatory Visit (HOSPITAL_BASED_OUTPATIENT_CLINIC_OR_DEPARTMENT_OTHER): Payer: Medicare HMO

## 2015-08-06 VITALS — BP 116/92 | HR 74 | Temp 97.3°F | Resp 18 | Ht 64.0 in | Wt 118.6 lb

## 2015-08-06 DIAGNOSIS — C4359 Malignant melanoma of other part of trunk: Secondary | ICD-10-CM | POA: Diagnosis not present

## 2015-08-06 DIAGNOSIS — C438 Malignant melanoma of overlapping sites of skin: Secondary | ICD-10-CM

## 2015-08-06 DIAGNOSIS — Z79811 Long term (current) use of aromatase inhibitors: Secondary | ICD-10-CM

## 2015-08-06 DIAGNOSIS — C50212 Malignant neoplasm of upper-inner quadrant of left female breast: Secondary | ICD-10-CM

## 2015-08-06 DIAGNOSIS — Z5189 Encounter for other specified aftercare: Secondary | ICD-10-CM

## 2015-08-06 DIAGNOSIS — C773 Secondary and unspecified malignant neoplasm of axilla and upper limb lymph nodes: Secondary | ICD-10-CM

## 2015-08-06 DIAGNOSIS — Z5112 Encounter for antineoplastic immunotherapy: Secondary | ICD-10-CM

## 2015-08-06 DIAGNOSIS — Z170 Estrogen receptor positive status [ER+]: Secondary | ICD-10-CM

## 2015-08-06 DIAGNOSIS — C787 Secondary malignant neoplasm of liver and intrahepatic bile duct: Secondary | ICD-10-CM

## 2015-08-06 DIAGNOSIS — C439 Malignant melanoma of skin, unspecified: Secondary | ICD-10-CM

## 2015-08-06 LAB — CBC WITH DIFFERENTIAL/PLATELET
BASO%: 1.1 % (ref 0.0–2.0)
BASOS ABS: 0.1 10*3/uL (ref 0.0–0.1)
EOS%: 1.1 % (ref 0.0–7.0)
Eosinophils Absolute: 0.1 10*3/uL (ref 0.0–0.5)
HEMATOCRIT: 39.1 % (ref 34.8–46.6)
HEMOGLOBIN: 12.8 g/dL (ref 11.6–15.9)
LYMPH#: 0.8 10*3/uL — AB (ref 0.9–3.3)
LYMPH%: 7.5 % — ABNORMAL LOW (ref 14.0–49.7)
MCH: 27.8 pg (ref 25.1–34.0)
MCHC: 32.8 g/dL (ref 31.5–36.0)
MCV: 84.7 fL (ref 79.5–101.0)
MONO#: 0.8 10*3/uL (ref 0.1–0.9)
MONO%: 7.2 % (ref 0.0–14.0)
NEUT#: 9 10*3/uL — ABNORMAL HIGH (ref 1.5–6.5)
NEUT%: 83.1 % — AB (ref 38.4–76.8)
PLATELETS: 272 10*3/uL (ref 145–400)
RBC: 4.62 10*6/uL (ref 3.70–5.45)
RDW: 13.3 % (ref 11.2–14.5)
WBC: 10.9 10*3/uL — ABNORMAL HIGH (ref 3.9–10.3)

## 2015-08-06 LAB — COMPREHENSIVE METABOLIC PANEL
ALBUMIN: 4.1 g/dL (ref 3.5–5.0)
ALK PHOS: 66 U/L (ref 40–150)
ALT: 15 U/L (ref 0–55)
ANION GAP: 12 meq/L — AB (ref 3–11)
AST: 23 U/L (ref 5–34)
BUN: 29.8 mg/dL — AB (ref 7.0–26.0)
CALCIUM: 10.4 mg/dL (ref 8.4–10.4)
CO2: 22 mEq/L (ref 22–29)
Chloride: 104 mEq/L (ref 98–109)
Creatinine: 0.8 mg/dL (ref 0.6–1.1)
EGFR: 66 mL/min/{1.73_m2} — ABNORMAL LOW (ref >90)
Glucose: 97 mg/dl (ref 70–140)
POTASSIUM: 4.5 meq/L (ref 3.5–5.1)
Sodium: 138 mEq/L (ref 136–145)
Total Bilirubin: 0.37 mg/dL (ref 0.20–1.20)
Total Protein: 7.3 g/dL (ref 6.4–8.3)

## 2015-08-06 LAB — TSH: TSH: 0.947 m[IU]/L (ref 0.308–3.960)

## 2015-08-06 LAB — LACTATE DEHYDROGENASE: LDH: 206 U/L (ref 125–245)

## 2015-08-06 MED ORDER — SODIUM CHLORIDE 0.9 % IV SOLN
Freq: Once | INTRAVENOUS | Status: AC
  Administered 2015-08-06: 14:00:00 via INTRAVENOUS

## 2015-08-06 MED ORDER — SODIUM CHLORIDE 0.9 % IV SOLN
2.0000 mg/kg | Freq: Once | INTRAVENOUS | Status: AC
  Administered 2015-08-06: 100 mg via INTRAVENOUS
  Filled 2015-08-06: qty 4

## 2015-08-06 NOTE — Patient Instructions (Addendum)
[facility] Discharge Instructions for Patients Receiving Chemotherapy  Today you received the following chemotherapy agents :  Keytruda.  To help prevent nausea and vomiting after your treatment, we encourage you to take your nausea medication as prescribed.   If you develop nausea and vomiting that is not controlled by your nausea medication, call the clinic.   BELOW ARE SYMPTOMS THAT SHOULD BE REPORTED IMMEDIATELY:  *FEVER GREATER THAN 100.5 F  *CHILLS WITH OR WITHOUT FEVER  NAUSEA AND VOMITING THAT IS NOT CONTROLLED WITH YOUR NAUSEA MEDICATION  *UNUSUAL SHORTNESS OF BREATH  *UNUSUAL BRUISING OR BLEEDING  TENDERNESS IN MOUTH AND THROAT WITH OR WITHOUT PRESENCE OF ULCERS  *URINARY PROBLEMS  *BOWEL PROBLEMS  UNUSUAL RASH Items with * indicate a potential emergency and should be followed up as soon as possible.  Feel free to call the clinic you have any questions or concerns. The clinic phone number is [phone].  Please show the CHEMO ALERT CARD at check-in to the Emergency Department and triage nurse.  Pembrolizumab injection What is this medicine? PEMBROLIZUMAB (pem broe liz ue mab) is a monoclonal antibody. It is used to treat melanoma and non-small cell lung cancer. This medicine may be used for other purposes; ask your health care provider or pharmacist if you have questions. What should I tell my health care provider before I take this medicine? They need to know if you have any of these conditions: -diabetes -immune system problems -inflammatory bowel disease -liver disease -lung or breathing disease -lupus -an unusual or allergic reaction to pembrolizumab, other medicines, foods, dyes, or preservatives -pregnant or trying to get pregnant -breast-feeding How should I use this medicine? This medicine is for infusion into a vein. It is given by a health care professional in a hospital or clinic setting. A special MedGuide will be given to  you before each treatment. Be sure to read this information carefully each time. Talk to your pediatrician regarding the use of this medicine in children. Special care may be needed. Overdosage: If you think you have taken too much of this medicine contact a poison control center or emergency room at once. NOTE: This medicine is only for you. Do not share this medicine with others. What if I miss a dose? It is important not to miss your dose. Call your doctor or health care professional if you are unable to keep an appointment. What may interact with this medicine? Interactions have not been studied. Give your health care provider a list of all the medicines, herbs, non-prescription drugs, or dietary supplements you use. Also tell them if you smoke, drink alcohol, or use illegal drugs. Some items may interact with your medicine. This list may not describe all possible interactions. Give your health care provider a list of all the medicines, herbs, non-prescription drugs, or dietary supplements you use. Also tell them if you smoke, drink alcohol, or use illegal drugs. Some items may interact with your medicine. What should I watch for while using this medicine? Your condition will be monitored carefully while you are receiving this medicine. You may need blood work done while you are taking this medicine. Do not become pregnant while taking this medicine or for 4 months after stopping it. Women should inform their doctor if they wish to become pregnant or think they might be pregnant. There is a potential for serious side effects to an unborn child. Talk to your health care professional or pharmacist for more information. Do not breast-feed an   infant while taking this medicine or for 4 months after the last dose. What side effects may I notice from receiving this medicine? Side effects that you should report to your doctor or health care professional as soon as possible: -allergic reactions like skin  rash, itching or hives, swelling of the face, lips, or tongue -bloody or black, tarry stools -breathing problems -change in the amount of urine -changes in vision -chest pain -chills -dark urine -dizziness or feeling faint or lightheaded -fast or irregular heartbeat -fever -flushing -hair loss -muscle pain -muscle weakness -persistent headache -signs and symptoms of high blood sugar such as dizziness; dry mouth; dry skin; fruity breath; nausea; stomach pain; increased hunger or thirst; increased urination -signs and symptoms of liver injury like dark urine, light-colored stools, loss of appetite, nausea, right upper belly pain, yellowing of the eyes or skin -stomach pain -weight loss Side effects that usually do not require medical attention (Report these to your doctor or health care professional if they continue or are bothersome.):constipation -cough -diarrhea -joint pain -tiredness This list may not describe all possible side effects. Call your doctor for medical advice about side effects. You may report side effects to FDA at 1-800-FDA-1088. Where should I keep my medicine? This drug is given in a hospital or clinic and will not be stored at home. NOTE: This sheet is a summary. It may not cover all possible information. If you have questions about this medicine, talk to your doctor, pharmacist, or health care provider.    2016, Elsevier/Gold Standard. (2014-07-17 17:24:19)  

## 2015-08-06 NOTE — Progress Notes (Signed)
[facility]  [phone] [phone]     ID: Timika Muench DOB: [birth year]  MR#: [id number]  JHH#:[id number]  Patient Care Team: Bartholome Bill, MD as PCP - General (Family Medicine) Armandina Gemma, MD as Consulting Physician (General Surgery) Chauncey Cruel, MD as Consulting Physician (Oncology) Allyn Kenner, MD (Dermatology) Gaynelle Arabian, MD as Consulting Physician (Orthopedic Surgery) PCP: Bartholome Bill, MD GYN: OTHER MD:  CHIEF COMPLAINT: Estrogen receptor positive breast cancer; stage IV malignant melanoma  CURRENT TREATMENT: Anastrozole, pembrolizumab   BREAST CANCER HISTORY: From the original intake note:  Radha she had an itchy spot in the middle of her back which she scratched sometimes, but this was not evaluated until [facility] fell off a chair while changing a light bulb and broke her left upper extremity. Because she couldn't change while in a sling, her daughter [name] had 2 give her a hand and in the process noted a black lesion in the middle of Lorelei's back. She took married to Dr. [name] office where on 04/17/2015 he performed a shave biopsy of a pigmented lesion over the spine in the upper back. The pathology report ([id number]) showed a breast low depth of at least 6.5 mm, with the anatomic level at least 4. There were 23 mitoses per square millimeter and extensive ulceration. There was no satellitosis. There was no evidence of regression. They host response was not brisk. This was read as a pathology stage T4b and the patient was referred to [name] for wide excision.  However on exam Dr. jerking noted bilateral axillary adenopathy and also a mass in the upper inner quadrant of the left breast. He set [name] up for bilateral mammography with tomography 06/06/2015, and this showed an irregular hyperdense mass in the left breast upper inner quadrant which was palpable. There was also a firm palpable mass in the left axilla as well as in  the contralateral, right axilla. Ultrasonography showed the left breast mass to measure 2.7 cm and at least 2 abnormally enlarged lymph nodes in the left axilla the larger one measuring 3.9 cm. In the right axilla there were also 2 abnormal appearing lymph nodes, the larger measuring 2.6 cm.  On 06/06/2015 Pailyn underwent biopsy of the left breast mass and both axillary adenopathy areas. The left breast mass proved to be an invasive ductal carcinoma , grade 2, estrogen and progesterone receptor positive, both at 100%, with strong staining intensity. HER-2 was not amplified, with a signals ratio of 1.42, the average copy number per cell being 2.55. The proliferation marker was 5%.  More importantly, both axillary lymph nodes showed metastatic melanoma. We were contacted and prior to today's visit we obtained a PET scan, which shows in addition to the left breast mass and the bilateral axillary lymph nodes (described as just deep to the latissimus dorsi muscles bilaterally) 2 hypermetabolic foci in the left lobe of the liver, measuring 1.3 and 0.8 cm, with an SUV max of 6.3. A 1.5 cm mass next to the left kidney, clearly separate from the adrenal gland, is also hypermetabolic with an SUV max of 8.2. There were no lung or bone lesions.  Feliza subsequent history is as detailed below   INTERVAL HISTORY: Aujanae returns today for follow-up of her melanoma accompanied by her daughter [name]. Curstin also has breast cancer, of course, and takes anastrozole for that, with good tolerance. Today is day 1 cycle 2 of pembrolizumab for her melanoma. She tolerated the first cycle with no  side effects at all that she can identify. Specifically she never had any rash, diarrhea, fatigue, fever, or other systemic symptoms.  REVIEW OF SYSTEMS: Shaleena has some arthralgias and myalgias that are no more intense or persistent than before. She is normally active, and hosted her son stepdaughters birthday party (she didn't do any of the  cooking but she still had everybody over at her house). She is busy doing her taxes another paperwork and is becoming increasingly confused regarding medical bills aside from these issues a detailed review of systems today was noncontributory  PAST MEDICAL HISTORY: No past medical history on file. Remote history of rectal prolapse with chronic constipation resulting, history of left humeral fracture, osteopenia  PAST SURGICAL HISTORY: Past Surgical History  Procedure Laterality Date  . Hip surgery      FAMILY HISTORY No family history on file. The patient's father died at the age of 9 in the setting of a call abuse. The patient's mother died from "old age" at 80. The patient had 4 brothers, no sisters. One brother died in his 7s from brain cancer. Another died from a stroke, and other from emphysema. The 4, youngest brother is 40 and jogs every day. There is no history of breast or ovarian cancer in the family. There is also no prior history of melanoma.  GYNECOLOGIC HISTORY:  No LMP recorded. Patient is postmenopausal. Menarche age 26, first live birth age 82, the patient is [place] P4. She stopped having periods in her early 80s. She did not use hormone replacement. She never took oral contraceptives.  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 She is widowed. She is now retired, and lives alone, with no pets. Her son [name] lives in [place], and is a retired Optometrist. Son [name] lives in [place] and is a Chief of Staff. Daughter [name] is a Marine scientist at [employer] locally. The fourth child, [name], died in an automobile accident at age 62. The patient has 4 grandchildren, no great grandchildren. She is a Corporate treasurer. She attends services at [employer].    ADVANCED DIRECTIVES: Not yet notarized; Janaisha intends to name her daughter [name] as her healthcare power of attorney. She can be reached at  [phone]   HEALTH MAINTENANCE: Social History  Substance Use Topics  . Smoking status: Never Smoker   . Smokeless tobacco: Not on file  . Alcohol Use: No     Colonoscopy: Remote  PAP:  Bone density: Remote  Lipid panel:  Allergies  Allergen Reactions  . Vicodin [Hydrocodone-Acetaminophen] Nausea Only    Current Outpatient Prescriptions  Medication Sig Dispense Refill  . anastrozole (ARIMIDEX) 1 MG tablet Take 1 tablet (1 mg total) by mouth daily. 90 tablet 4  . aspirin EC 81 MG tablet Take by mouth.    . enalapril (VASOTEC) 20 MG tablet Take 20 mg by mouth 2 (two) times daily.     . Lutein 20 MG CAPS Take by mouth.    . ondansetron (ZOFRAN ODT) 8 MG disintegrating tablet Take 1 tablet (8 mg total) by mouth every 8 (eight) hours as needed for nausea or vomiting. (Patient not taking: Reported on 07/01/2015) 15 tablet 0  . Red Yeast Rice Extract (RED YEAST RICE PO) Take by mouth.    . triamterene-hydrochlorothiazide (DYAZIDE) 37.5-25 MG capsule Take 1 capsule by mouth daily.     . verapamil (CALAN-SR) 240 MG CR tablet   0  No current facility-administered medications for this visit.    OBJECTIVE: Older white woman Who appears stated age 63 Vitals:   08/06/15 1218  BP: 116/92  Pulse: 74  Temp: 97.3 F (36.3 C)  Resp: 18     Body mass index is 20.35 kg/(m^2).    ECOG FS:1 - Symptomatic but completely ambulatory  Sclerae unicteric, pupils round and equal Oropharynx clear and moist-- no thrush or other lesions No cervical or supraclavicular adenopathy; the bilateral axillary adenopathy appears to have significantly shrunk. On the right I only feel one mass, about 1-1/2-2 cm. On the left active date deep to feel a half a centimeter mass. Lungs no rales or rhonchi Heart regular rate and rhythm Abd soft, nontender, positive bowel sounds MSK no focal spinal tenderness, no upper extremity lymphedema Neuro: nonfocal, well oriented, appropriate affect Breasts:  Deferred  LAB RESULTS:  CMP     Component Value Date/Time   NA 142 07/24/2015 1135   NA 134* 07/30/2006 1318   K 4.4 07/24/2015 1135   K 4.3 07/30/2006 1318   CL 100 07/30/2006 1318   CO2 28 07/24/2015 1135   CO2 24 07/30/2006 1318   GLUCOSE 102 07/24/2015 1135   GLUCOSE 102* 07/30/2006 1318   BUN 21.2 07/24/2015 1135   BUN 19 07/30/2006 1318   CREATININE 0.7 07/24/2015 1135   CREATININE 0.7 07/30/2006 1318   CALCIUM 10.6* 07/24/2015 1135   CALCIUM 9.4 07/30/2006 1318   PROT 7.1 07/24/2015 1135   ALBUMIN 3.9 07/24/2015 1135   AST 23 07/24/2015 1135   ALT 14 07/24/2015 1135   ALKPHOS 65 07/24/2015 1135   BILITOT 0.31 07/24/2015 1135   GFRNONAA 86 07/30/2006 1318   GFRAA 104 07/30/2006 1318    INo results found for: SPEP, UPEP  Lab Results  Component Value Date   WBC 10.9* 08/06/2015   NEUTROABS 9.0* 08/06/2015   HGB 12.8 08/06/2015   HCT 39.1 08/06/2015   MCV 84.7 08/06/2015   PLT 272 08/06/2015      Chemistry      Component Value Date/Time   NA 142 07/24/2015 1135   NA 134* 07/30/2006 1318   K 4.4 07/24/2015 1135   K 4.3 07/30/2006 1318   CL 100 07/30/2006 1318   CO2 28 07/24/2015 1135   CO2 24 07/30/2006 1318   BUN 21.2 07/24/2015 1135   BUN 19 07/30/2006 1318   CREATININE 0.7 07/24/2015 1135   CREATININE 0.7 07/30/2006 1318      Component Value Date/Time   CALCIUM 10.6* 07/24/2015 1135   CALCIUM 9.4 07/30/2006 1318   ALKPHOS 65 07/24/2015 1135   AST 23 07/24/2015 1135   ALT 14 07/24/2015 1135   BILITOT 0.31 07/24/2015 1135       No results found for: LABCA2  No components found for: LABCA125  No results for input(s): INR in the last 168 hours.  Urinalysis No results found for: COLORURINE, APPEARANCEUR, LABSPEC, PHURINE, GLUCOSEU, HGBUR, BILIRUBINUR, KETONESUR, PROTEINUR, UROBILINOGEN, NITRITE, LEUKOCYTESUR  STUDIES: Mr Kizzie Fantasia Contrast  Jul 30, 2015  CLINICAL DATA:  82 year old female with stage IV melanoma. Decreased hearing.  Subsequent encounter. EXAM: MRI HEAD WITHOUT AND WITH CONTRAST TECHNIQUE: Multiplanar, multiecho pulse sequences of the brain and surrounding structures were obtained without and with intravenous contrast. CONTRAST:  13m MULTIHANCE GADOBENATE DIMEGLUMINE 529 MG/ML IV SOLN COMPARISON:  PET-CT 07/01/2015 FINDINGS: No midline shift, mass effect, or evidence of intracranial mass lesion. No abnormal enhancement identified. No dural thickening identified. Cerebral volume is within  normal limits for age. No restricted diffusion to suggest acute infarction. No ventriculomegaly, extra-axial collection or acute intracranial hemorrhage. Cervicomedullary junction and pituitary are within normal limits. Major intracranial vascular flow voids are preserved, dominant appearing distal left vertebral artery. Mild to moderate for age scattered cerebral white matter T2 and FLAIR hyperintensity without enhancement. Mild for age involvement of the deep gray matter nuclei, although there is evidence of a small chronic lacunar infarct in the right thalamus. Mild involvement of the pons. No cortical encephalomalacia or cerebral blood products identified. Negative visualized cervical spine and spinal cord. Visible bone marrow signal is normal. Orbit and scalp soft tissues are within normal limits. Mildly conspicuous but nonenlarged left level 2 lymph node on series 13, image 17. Paranasal sinuses and mastoids are clear. Visible internal auditory structures appear normal. Tympanic cavities appear clear. IMPRESSION: 1. No acute or metastatic intracranial abnormality. No explanation for acute hearing loss identified. 2. Mild to moderate for age signal changes in the brain most commonly due to chronic small vessel disease. Electronically Signed   By: Genevie Ann M.D.   On: 07/17/2015 07:36   Dg Bone Density  07/25/2015  EXAM: DUAL X-RAY ABSORPTIOMETRY (DXA) FOR BONE MINERAL DENSITY IMPRESSION: Referring Physician:  Chauncey Cruel PATIENT:  Name: Seymone, Forlenza Patient ID: [id number] Birth Date: Jul 28, 1925 Height: 63.0 in. Sex: Female Measured: 07/25/2015 Weight: 117.0 lbs. Indications: Advanced Age, Anastrazole, Breast Cancer History, Caucasian, Estrogen Deficient, Height Loss (781.91), History of Fracture (Adult) (V15.51), Left hip replaced, Postmenopausal Fractures: Humerus, Knee Treatments: Calcium (E943.0), Vitamin D (E933.5) ASSESSMENT: The BMD measured at AP Spine L2-L4 is 0.778 g/cm2 with a T-score of -3.5. This patient is considered osteoporotic according to [facility]) criteria. L-1 was excluded due to degenerative changes. Left femur was excluded due to surgical hardware. Site Region Measured Date Measured Age YA BMD Significant CHANGE T-score AP Spine    L2-L4  07/25/2015    87.9         -3.5    0.778 g/cm2 Right Femur Total  07/25/2015    87.9         -2.5    0.689 g/cm2 World Health Organization [facility]) criteria for post-menopausal, Caucasian Women: Normal       T-score at or above -1 SD Osteopenia   T-score between -1 and -2.5 SD Osteoporosis T-score at or below -2.5 SD RECOMMENDATION: [place] recommends that FDA-approved medical therapies be considered in postmenopausal women and men age 70 or older with a: 1. Hip or vertebral (clinical or morphometric) fracture. 2. T-score of <-2.5 at the spine or hip. 3. Ten-year fracture probability by FRAX of 3% or greater for hip fracture or 20% or greater for major osteoporotic fracture. All treatment decisions require clinical judgment and consideration of individual patient factors, including patient preferences, co-morbidities, previous drug use, risk factors not captured in the FRAX model (e.g. falls, vitamin D deficiency, increased bone turnover, interval significant decline in bone density) and possible under - or over-estimation of fracture risk by FRAX. All patients should ensure an adequate intake of dietary calcium (1200 mg/d) and vitamin D (800  IU daily) unless contraindicated. FOLLOW-UP: People with diagnosed cases of osteoporosis or at high risk for fracture should have regular bone mineral density tests. For patients eligible for Medicare, routine testing is allowed once every 2 years. The testing frequency can be increased to one year for patients who have rapidly progressing disease, those who are receiving or discontinuing medical therapy to  restore bone mass, or have additional risk factors. I have reviewed this report, and agree with the above findings. [facility] Radiology Electronically Signed   By: David  [place] M.D.   On: 07/25/2015 09:19    ASSESSMENT: 82 y.o. [place] woman with concurrent breast cancer and melanoma  BREAST CANCER: (1) left breast upper inner quadrant biopsy 06/06/2015 shows a clinical T2 NX, stage II invasive ductal carcinoma , grade 2, estrogen receptor and progesterone receptor strongly positive, with an MIB-1 of 5%, and no HER-2 amplification, the signals ratio being 1.42 and the number per cell 2.55.   (2) anastrozole started 07/01/2015  MALIGNANT MELANOMA: (a) shave biopsy from the mid upper back 04/17/2015 shows invasive malignant melanoma, B[place]low depth 6.5 mm plus, Clark's level 4+, with 23 mitoses per square millimeter, extensive ulceration,, with involved edges, and non-brisk host response  (i) biopsy of right and left axillary lymph nodes 06/06/2015 showed metastatic melanoma, BRAF wild type  (ii) PET scan every 12/19/2015 shows bilateral axillary adenopathy, a left latissimus mass, a left pararenal mass, and 2 liver lesions  (b) pembrolizumab started 07/16/2015, repeated every 21 days   PLAN: Fina is ready to proceed to her second dose of pembrolizumab. At this point she has had no side effects that I can detect. The plan is to continue to a total of 4 cycles and then restage.  I can barely feel a lymph node deep in the left axilla. The one on the right seems a little smaller but still is  easily palpable. We can follow this for now.  She is tolerating anastrozole well, with no side effects there that I can detect.  With regards to medical bills I am referring her to our billing specialist, but if the question is really related to a different system such as the [place] she should really address her questions to them.  Otherwise she will return to see [place] next week just to check her counts and make sure she is tolerating things well. She will see me again March 28 with her third cycle of treatment.  She knows to call for any problems that may develop before the next visit here.  Chauncey Cruel, MD   08/06/2015 12:26 PM Medical Oncology and Hematology [facility] [address] [place], [place] [address] Tel. [phone]    Fax. [phone]

## 2015-08-14 ENCOUNTER — Other Ambulatory Visit (HOSPITAL_BASED_OUTPATIENT_CLINIC_OR_DEPARTMENT_OTHER): Payer: Medicare HMO

## 2015-08-14 ENCOUNTER — Encounter: Payer: Self-pay | Admitting: Nurse Practitioner

## 2015-08-14 ENCOUNTER — Ambulatory Visit (HOSPITAL_BASED_OUTPATIENT_CLINIC_OR_DEPARTMENT_OTHER): Payer: Medicare HMO | Admitting: Nurse Practitioner

## 2015-08-14 VITALS — BP 151/66 | HR 80 | Temp 97.5°F | Resp 18 | Ht 64.0 in | Wt 119.7 lb

## 2015-08-14 DIAGNOSIS — C4359 Malignant melanoma of other part of trunk: Secondary | ICD-10-CM

## 2015-08-14 DIAGNOSIS — C50212 Malignant neoplasm of upper-inner quadrant of left female breast: Secondary | ICD-10-CM | POA: Diagnosis not present

## 2015-08-14 DIAGNOSIS — C773 Secondary and unspecified malignant neoplasm of axilla and upper limb lymph nodes: Secondary | ICD-10-CM | POA: Diagnosis not present

## 2015-08-14 DIAGNOSIS — Z170 Estrogen receptor positive status [ER+]: Secondary | ICD-10-CM

## 2015-08-14 DIAGNOSIS — C439 Malignant melanoma of skin, unspecified: Secondary | ICD-10-CM

## 2015-08-14 DIAGNOSIS — C438 Malignant melanoma of overlapping sites of skin: Secondary | ICD-10-CM

## 2015-08-14 DIAGNOSIS — Z79811 Long term (current) use of aromatase inhibitors: Secondary | ICD-10-CM

## 2015-08-14 LAB — COMPREHENSIVE METABOLIC PANEL
ALT: 14 U/L (ref 0–55)
ANION GAP: 9 meq/L (ref 3–11)
AST: 21 U/L (ref 5–34)
Albumin: 3.9 g/dL (ref 3.5–5.0)
Alkaline Phosphatase: 64 U/L (ref 40–150)
BUN: 18.8 mg/dL (ref 7.0–26.0)
CALCIUM: 10.8 mg/dL — AB (ref 8.4–10.4)
CHLORIDE: 103 meq/L (ref 98–109)
CO2: 28 mEq/L (ref 22–29)
CREATININE: 0.8 mg/dL (ref 0.6–1.1)
EGFR: 70 mL/min/{1.73_m2} — ABNORMAL LOW (ref >90)
Glucose: 128 mg/dl (ref 70–140)
POTASSIUM: 4.3 meq/L (ref 3.5–5.1)
Sodium: 140 mEq/L (ref 136–145)
Total Bilirubin: <0.3 mg/dL (ref 0.20–1.20)
Total Protein: 7.2 g/dL (ref 6.4–8.3)

## 2015-08-14 LAB — CBC WITH DIFFERENTIAL/PLATELET
BASO%: 0.8 % (ref 0.0–2.0)
Basophils Absolute: 0.1 10*3/uL (ref 0.0–0.1)
EOS ABS: 0.1 10*3/uL (ref 0.0–0.5)
EOS%: 0.7 % (ref 0.0–7.0)
HEMATOCRIT: 38.2 % (ref 34.8–46.6)
HEMOGLOBIN: 12.4 g/dL (ref 11.6–15.9)
LYMPH%: 9 % — ABNORMAL LOW (ref 14.0–49.7)
MCH: 27.6 pg (ref 25.1–34.0)
MCHC: 32.6 g/dL (ref 31.5–36.0)
MCV: 84.7 fL (ref 79.5–101.0)
MONO#: 0.7 10*3/uL (ref 0.1–0.9)
MONO%: 7.8 % (ref 0.0–14.0)
NEUT#: 7.1 10*3/uL — ABNORMAL HIGH (ref 1.5–6.5)
NEUT%: 81.7 % — ABNORMAL HIGH (ref 38.4–76.8)
PLATELETS: 278 10*3/uL (ref 145–400)
RBC: 4.51 10*6/uL (ref 3.70–5.45)
RDW: 13.3 % (ref 11.2–14.5)
WBC: 8.7 10*3/uL (ref 3.9–10.3)
lymph#: 0.8 10*3/uL — ABNORMAL LOW (ref 0.9–3.3)

## 2015-08-14 NOTE — Progress Notes (Signed)
[facility]  [phone] Fax:([phone]) [phone]     ID: Danielle [name] DOB: [birth year]  MR#: [id number]  MGQ#:[id number]  Patient Care Team: Bartholome Bill, MD as PCP - General (Family Medicine) Armandina Gemma, MD as Consulting Physician (General Surgery) Chauncey Cruel, MD as Consulting Physician (Oncology) Allyn Kenner, MD (Dermatology) Danielle Arabian, MD as Consulting Physician (Orthopedic Surgery) PCP: Bartholome Bill, MD GYN: OTHER MD:  CHIEF COMPLAINT: Estrogen receptor positive breast cancer; stage IV malignant melanoma  CURRENT TREATMENT: Anastrozole, pembrolizumab   BREAST CANCER HISTORY: From the original intake note:  Danielle [name] she had an itchy spot in the middle of her back which she scratched sometimes, but this was not evaluated until [facility] fell off a chair while changing a light bulb and broke her left upper extremity. Because she couldn't change while in a sling, her daughter Danielle [name] had 2 give her a hand and in the process noted a black lesion in the middle of Danielle [name]'s back. She took married to Dr. Juel [name] office where on 04/17/2015 he performed a shave biopsy of a pigmented lesion over the spine in the upper back. The pathology report ([id number]) showed a breast low depth of at least 6.5 mm, with the anatomic level at least 4. There were 23 mitoses per square millimeter and extensive ulceration. There was no satellitosis. There was no evidence of regression. They host response was not brisk. This was read as a pathology stage T4b and the patient was referred to Dr Danielle [name] for wide excision.  However on exam Danielle [name] noted bilateral axillary adenopathy and also a mass in the upper inner quadrant of the left breast. He set Danielle [name] up for bilateral mammography with tomography 06/06/2015, and this showed an irregular hyperdense mass in the left breast upper inner quadrant which was palpable. There was also a firm palpable mass in the left axilla as well as in  the contralateral, right axilla. Ultrasonography showed the left breast mass to measure 2.7 cm and at least 2 abnormally enlarged lymph nodes in the left axilla the larger one measuring 3.9 cm. In the right axilla there were also 2 abnormal appearing lymph nodes, the larger measuring 2.6 cm.  On 06/06/2015 Danielle [name] underwent biopsy of the left breast mass and both axillary adenopathy areas. The left breast mass proved to be an invasive ductal carcinoma , grade 2, estrogen and progesterone receptor positive, both at 100%, with strong staining intensity. HER-2 was not amplified, with a signals ratio of 1.42, the average copy number per cell being 2.55. The proliferation marker was 5%.  More importantly, both axillary lymph nodes showed metastatic melanoma. We were contacted and prior to today's visit we obtained a PET scan, which shows in addition to the left breast mass and the bilateral axillary lymph nodes (described as just deep to the latissimus dorsi muscles bilaterally) 2 hypermetabolic foci in the left lobe of the liver, measuring 1.3 and 0.8 cm, with an SUV max of 6.3. A 1.5 cm mass next to the left [name], clearly separate from the adrenal gland, is also hypermetabolic with an SUV max of 8.2. There were no lung or bone lesions.  Danielle [name] subsequent history is as detailed below   INTERVAL HISTORY: Danielle [name] returns today for follow-up of her melanoma, accompanied by her daughter Danielle [name]. Today is day 9, cycle 2 of pembrolizumab. She tolerates this well with no side effects that she is aware of. She is also on anastrozole daily for her estrogen positive breast cancer.  This does not cause her any issues either.   REVIEW OF SYSTEMS: Danielle [name] feels so good she continues to workout with the [employer] program a few times a week. She has some mild arthralgias and myalgias that are no worse than her baseline. She has no hot flashes or vaginal changes. She denies fevers, chills, or changes in bowel or bladder habits.  She has had a "sensitive stomach" since she was a young adult, that is not any worse since starting treatment. She will suck on ginger hard candies as necessary. Her appetite is fair. She has finished her taxes. She will head to the financial department this afternoon to get some things worked out. She feels less stress. A detailed review of systems is otherwise stable.  PAST MEDICAL HISTORY: No past medical history on file. Remote history of rectal prolapse with chronic constipation resulting, history of left humeral fracture, osteopenia  PAST SURGICAL HISTORY: Past Surgical History  Procedure Laterality Date  . Hip surgery      FAMILY HISTORY No family history on file. The patient's father died at the age of 25 in the setting of a call abuse. The patient's mother died from "old age" at 35. The patient had 4 brothers, no sisters. One brother died in his 26s from brain cancer. Another died from a stroke, and other from emphysema. The 1, youngest brother is 58 and jogs every day. There is no history of breast or ovarian cancer in the family. There is also no prior history of melanoma.  GYNECOLOGIC HISTORY:  No LMP recorded. Patient is postmenopausal. Menarche age 52, first live birth age 35, the patient is [name] P4. She stopped having periods in her early 24s. She did not use hormone replacement. She never took oral contraceptives.  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 She is widowed. She is now retired, and lives alone, with no pets. Her son Danielle [name] lives in [place], and is a retired Optometrist. Son Danielle [name] lives in [place] and is a Chief of Staff. Daughter Danielle [name] is a Marine scientist at [employer] locally. The fourth child, Danielle [name], died in an automobile accident at age 54. The patient has 4 grandchildren, no great grandchildren. She is a Corporate treasurer. She attends services at [employer].    ADVANCED DIRECTIVES: Not  yet notarized; Deaysia intends to name her daughter Danielle [name] as her healthcare power of attorney. She can be reached at [phone]   HEALTH MAINTENANCE: Social History  Substance Use Topics  . Smoking status: Never Smoker   . Smokeless tobacco: Not on file  . Alcohol Use: No     Colonoscopy: Remote  PAP:  Bone density: Remote  Lipid panel:  Allergies  Allergen Reactions  . Vicodin [Hydrocodone-Acetaminophen] Nausea Only    Current Outpatient Prescriptions  Medication Sig Dispense Refill  . anastrozole (ARIMIDEX) 1 MG tablet Take 1 tablet (1 mg total) by mouth daily. 90 tablet 4  . aspirin EC 81 MG tablet Take by mouth.    . enalapril (VASOTEC) 20 MG tablet Take 20 mg by mouth 2 (two) times daily.     . Lutein 20 MG CAPS Take by mouth.    . ondansetron (ZOFRAN ODT) 8 MG disintegrating tablet Take 1 tablet (8 mg total) by mouth every 8 (eight) hours as needed for nausea or vomiting. (Patient not taking: Reported on 07/01/2015) 15 tablet 0  . Red Yeast Rice Extract (RED YEAST  RICE PO) Take by mouth.    . triamterene-hydrochlorothiazide (DYAZIDE) 37.5-25 MG capsule Take 1 capsule by mouth daily.     . verapamil (CALAN-SR) 240 MG CR tablet   0   No current facility-administered medications for this visit.    OBJECTIVE: Older white woman Who appears stated age 82 Vitals:   08/14/15 1328  BP: 151/66  Pulse: 80  Temp: 97.5 F (36.4 C)  Resp: 18     Body mass index is 20.54 kg/(m^2).    ECOG FS:1 - Symptomatic but completely ambulatory  Skin: warm, dry  HEENT: sclerae anicteric, conjunctivae pink, oropharynx clear. No thrush or mucositis.  Lymph Nodes: No cervical or supraclavicular lymphadenopathy, I was unable to palpate a lymph node to the left axilla. The right axilla has a barely palpable node 1cm or less Lungs: clear to auscultation bilaterally, no rales, wheezes, or rhonci  Heart: regular rate and rhythm  Abdomen: round, soft, non tender, positive bowel sounds    Musculoskeletal: No focal spinal tenderness, no peripheral edema  Neuro: non focal, well oriented, positive affect  Breasts: deferred  LAB RESULTS:  CMP     Component Value Date/Time   NA 140 08/14/2015 1314   NA 134* 07/30/2006 1318   K 4.3 08/14/2015 1314   K 4.3 07/30/2006 1318   CL 100 07/30/2006 1318   CO2 28 08/14/2015 1314   CO2 24 07/30/2006 1318   GLUCOSE 128 08/14/2015 1314   GLUCOSE 102* 07/30/2006 1318   BUN 18.8 08/14/2015 1314   BUN 19 07/30/2006 1318   CREATININE 0.8 08/14/2015 1314   CREATININE 0.7 07/30/2006 1318   CALCIUM 10.8* 08/14/2015 1314   CALCIUM 9.4 07/30/2006 1318   PROT 7.2 08/14/2015 1314   ALBUMIN 3.9 08/14/2015 1314   AST 21 08/14/2015 1314   ALT 14 08/14/2015 1314   ALKPHOS 64 08/14/2015 1314   BILITOT <0.30 08/14/2015 1314   GFRNONAA 86 07/30/2006 1318   GFRAA 104 07/30/2006 1318    INo results found for: SPEP, UPEP  Lab Results  Component Value Date   WBC 8.7 08/14/2015   NEUTROABS 7.1* 08/14/2015   HGB 12.4 08/14/2015   HCT 38.2 08/14/2015   MCV 84.7 08/14/2015   PLT 278 08/14/2015      Chemistry      Component Value Date/Time   NA 140 08/14/2015 1314   NA 134* 07/30/2006 1318   K 4.3 08/14/2015 1314   K 4.3 07/30/2006 1318   CL 100 07/30/2006 1318   CO2 28 08/14/2015 1314   CO2 24 07/30/2006 1318   BUN 18.8 08/14/2015 1314   BUN 19 07/30/2006 1318   CREATININE 0.8 08/14/2015 1314   CREATININE 0.7 07/30/2006 1318      Component Value Date/Time   CALCIUM 10.8* 08/14/2015 1314   CALCIUM 9.4 07/30/2006 1318   ALKPHOS 64 08/14/2015 1314   AST 21 08/14/2015 1314   ALT 14 08/14/2015 1314   BILITOT <0.30 08/14/2015 1314       No results found for: LABCA2  No components found for: LABCA125  No results for input(s): INR in the last 168 hours.  Urinalysis No results found for: COLORURINE, APPEARANCEUR, LABSPEC, PHURINE, GLUCOSEU, HGBUR, BILIRUBINUR, KETONESUR, PROTEINUR, UROBILINOGEN, NITRITE,  LEUKOCYTESUR  STUDIES: Mr Kizzie Fantasia Contrast  08-04-2015  CLINICAL DATA:  82 year old female with stage IV melanoma. Decreased hearing. Subsequent encounter. EXAM: MRI HEAD WITHOUT AND WITH CONTRAST TECHNIQUE: Multiplanar, multiecho pulse sequences of the brain and surrounding structures were obtained without  and with intravenous contrast. CONTRAST:  27m MULTIHANCE GADOBENATE DIMEGLUMINE 529 MG/ML IV SOLN COMPARISON:  PET-CT 07/01/2015 FINDINGS: No midline shift, mass effect, or evidence of intracranial mass lesion. No abnormal enhancement identified. No dural thickening identified. Cerebral volume is within normal limits for age. No restricted diffusion to suggest acute infarction. No ventriculomegaly, extra-axial collection or acute intracranial hemorrhage. Cervicomedullary junction and pituitary are within normal limits. Major intracranial vascular flow voids are preserved, dominant appearing distal left vertebral artery. Mild to moderate for age scattered cerebral white matter T2 and FLAIR hyperintensity without enhancement. Mild for age involvement of the deep gray matter nuclei, although there is evidence of a small chronic lacunar infarct in the right thalamus. Mild involvement of the pons. No cortical encephalomalacia or cerebral blood products identified. Negative visualized cervical spine and spinal cord. Visible bone marrow signal is normal. Orbit and scalp soft tissues are within normal limits. Mildly conspicuous but nonenlarged left level 2 lymph node on series 13, image 17. Paranasal sinuses and mastoids are clear. Visible internal auditory structures appear normal. Tympanic cavities appear clear. IMPRESSION: 1. No acute or metastatic intracranial abnormality. No explanation for acute hearing loss identified. 2. Mild to moderate for age signal changes in the brain most commonly due to chronic small vessel disease. Electronically Signed   By: HGenevie AnnM.D.   On: 07/17/2015 07:36   Dg Bone  Density  07/25/2015  EXAM: DUAL X-RAY ABSORPTIOMETRY (DXA) FOR BONE MINERAL DENSITY IMPRESSION: Referring Physician:  GChauncey CruelPATIENT: Name: AJaniyla, LongPatient ID: [id number] Date: 0Sep 05, 1927Height: 63.0 in. Sex: Female Measured: 07/25/2015 Weight: 117.0 lbs. Indications: Advanced Age, Anastrazole, Breast Cancer History, Caucasian, Estrogen Deficient, Height Loss (781.91), History of Fracture (Adult) (V15.51), Left hip replaced, Postmenopausal Fractures: Humerus, Knee Treatments: Calcium (E943.0), Vitamin D (E933.5) ASSESSMENT: The BMD measured at AP Spine L2-L4 is 0.778 g/cm2 with a T-score of -3.5. This patient is considered osteoporotic according to [place]([facility] criteria. L-1 was excluded due to degenerative changes. Left femur was excluded due to surgical hardware. Site Region Measured Date Measured Age YA BMD Significant CHANGE T-score AP Spine    L2-L4  07/25/2015    87.9         -3.5    0.778 g/cm2 Right Femur Total  07/25/2015    87.9         -2.5    0.689 g/cm2 World Health Organization ([facility] criteria for post-menopausal, Caucasian Women: Normal       T-score at or above -1 SD Osteopenia   T-score between -1 and -2.5 SD Osteoporosis T-score at or below -2.5 SD RECOMMENDATION: [place]commends that FDA-approved medical therapies be considered in postmenopausal women and men age 3811or older with a: 1. Hip or vertebral (clinical or morphometric) fracture. 2. T-score of <-2.5 at the spine or hip. 3. Ten-year fracture probability by FRAX of 3% or greater for hip fracture or 20% or greater for major osteoporotic fracture. All treatment decisions require clinical judgment and consideration of individual patient factors, including patient preferences, co-morbidities, previous drug use, risk factors not captured in the FRAX model (e.g. falls, vitamin D deficiency, increased bone turnover, interval significant decline in bone density) and possible  under - or over-estimation of fracture risk by FRAX. All patients should ensure an adequate intake of dietary calcium (1200 mg/d) and vitamin D (800 IU daily) unless contraindicated. FOLLOW-UP: People with diagnosed cases of osteoporosis or at high risk for fracture should have regular  bone mineral density tests. For patients eligible for Medicare, routine testing is allowed once every 2 years. The testing frequency can be increased to one year for patients who have rapidly progressing disease, those who are receiving or discontinuing medical therapy to restore bone mass, or have additional risk factors. I have reviewed this report, and agree with the above findings. [facility]iology Electronically Signed   By: David  Martinique M.D.   On: 07/25/2015 09:19    ASSESSMENT: 82 y.o. [place] woman with concurrent breast cancer and melanoma  BREAST CANCER: (1) left breast upper inner quadrant biopsy 06/06/2015 shows a clinical T2 NX, stage II invasive ductal carcinoma , grade 2, estrogen receptor and progesterone receptor strongly positive, with an MIB-1 of 5%, and no HER-2 amplification, the signals ratio being 1.42 and the number per cell 2.55.   (2) anastrozole started 07/01/2015  MALIGNANT MELANOMA: (a) shave biopsy from the mid upper back 04/17/2015 shows invasive malignant melanoma, Breslow depth 6.5 mm plus, Clark's level 4+, with 23 mitoses per square millimeter, extensive ulceration,, with involved edges, and non-brisk host response  (i) biopsy of right and left axillary lymph nodes 06/06/2015 showed metastatic melanoma, BRAF wild type  (ii) PET scan every 12/19/2015 shows bilateral axillary adenopathy, a left latissimus mass, a left pararenal mass, and 2 liver lesions  (b) pembrolizumab started 07/16/2015, repeated every 21 days   PLAN: Lendy continues to tolerate treatment remarkably well. We will continue for 4 cycles of pembrolizumab before restaging scans are performed. The labs were  reviewed in detail and were entirely stable. She will continue on anastrozole daily as well. She has no complaints with this drug either.  Tami will return in 2 weeks for follow up, prior to the start of cycle 3 of treatment. She understands and agrees with this plan. She knows the goal of treatment in her case is control. She has been encouraged to call with any issues that might arise before her next visit here.   Laurie Panda, NP   08/14/2015 2:18 PM

## 2015-08-27 ENCOUNTER — Encounter: Payer: Self-pay | Admitting: General Practice

## 2015-08-27 ENCOUNTER — Ambulatory Visit (HOSPITAL_BASED_OUTPATIENT_CLINIC_OR_DEPARTMENT_OTHER): Payer: Medicare HMO | Admitting: Oncology

## 2015-08-27 ENCOUNTER — Ambulatory Visit (HOSPITAL_BASED_OUTPATIENT_CLINIC_OR_DEPARTMENT_OTHER): Payer: Medicare HMO

## 2015-08-27 ENCOUNTER — Other Ambulatory Visit (HOSPITAL_BASED_OUTPATIENT_CLINIC_OR_DEPARTMENT_OTHER): Payer: Medicare HMO

## 2015-08-27 VITALS — BP 144/57 | HR 77 | Temp 97.8°F | Resp 18 | Ht 64.0 in | Wt 120.2 lb

## 2015-08-27 DIAGNOSIS — C50212 Malignant neoplasm of upper-inner quadrant of left female breast: Secondary | ICD-10-CM | POA: Diagnosis not present

## 2015-08-27 DIAGNOSIS — M810 Age-related osteoporosis without current pathological fracture: Secondary | ICD-10-CM

## 2015-08-27 DIAGNOSIS — Z5112 Encounter for antineoplastic immunotherapy: Secondary | ICD-10-CM | POA: Diagnosis not present

## 2015-08-27 DIAGNOSIS — C773 Secondary and unspecified malignant neoplasm of axilla and upper limb lymph nodes: Secondary | ICD-10-CM

## 2015-08-27 DIAGNOSIS — C439 Malignant melanoma of skin, unspecified: Secondary | ICD-10-CM

## 2015-08-27 DIAGNOSIS — C787 Secondary malignant neoplasm of liver and intrahepatic bile duct: Secondary | ICD-10-CM

## 2015-08-27 DIAGNOSIS — C4359 Malignant melanoma of other part of trunk: Secondary | ICD-10-CM

## 2015-08-27 DIAGNOSIS — Z170 Estrogen receptor positive status [ER+]: Secondary | ICD-10-CM | POA: Diagnosis not present

## 2015-08-27 DIAGNOSIS — C438 Malignant melanoma of overlapping sites of skin: Secondary | ICD-10-CM

## 2015-08-27 LAB — CBC WITH DIFFERENTIAL/PLATELET
BASO%: 0 % (ref 0.0–2.0)
BASOS ABS: 0 10*3/uL (ref 0.0–0.1)
EOS ABS: 0 10*3/uL (ref 0.0–0.5)
EOS%: 0.1 % (ref 0.0–7.0)
HCT: 35.1 % (ref 34.8–46.6)
HEMOGLOBIN: 11.7 g/dL (ref 11.6–15.9)
LYMPH%: 9.9 % — AB (ref 14.0–49.7)
MCH: 28.1 pg (ref 25.1–34.0)
MCHC: 33.3 g/dL (ref 31.5–36.0)
MCV: 84.4 fL (ref 79.5–101.0)
MONO#: 0.4 10*3/uL (ref 0.1–0.9)
MONO%: 3.8 % (ref 0.0–14.0)
NEUT#: 8.7 10*3/uL — ABNORMAL HIGH (ref 1.5–6.5)
NEUT%: 86.2 % — ABNORMAL HIGH (ref 38.4–76.8)
Platelets: 267 10*3/uL (ref 145–400)
RBC: 4.16 10*6/uL (ref 3.70–5.45)
RDW: 12.8 % (ref 11.2–14.5)
WBC: 10.1 10*3/uL (ref 3.9–10.3)
lymph#: 1 10*3/uL (ref 0.9–3.3)

## 2015-08-27 LAB — COMPREHENSIVE METABOLIC PANEL
ALBUMIN: 3.7 g/dL (ref 3.5–5.0)
ALK PHOS: 59 U/L (ref 40–150)
ALT: 17 U/L (ref 0–55)
AST: 26 U/L (ref 5–34)
Anion Gap: 13 mEq/L — ABNORMAL HIGH (ref 3–11)
BUN: 20.5 mg/dL (ref 7.0–26.0)
CHLORIDE: 103 meq/L (ref 98–109)
CO2: 25 mEq/L (ref 22–29)
Calcium: 10 mg/dL (ref 8.4–10.4)
Creatinine: 0.7 mg/dL (ref 0.6–1.1)
EGFR: 75 mL/min/{1.73_m2} — AB (ref >90)
GLUCOSE: 115 mg/dL (ref 70–140)
POTASSIUM: 3.4 meq/L — AB (ref 3.5–5.1)
SODIUM: 140 meq/L (ref 136–145)
Total Bilirubin: 0.42 mg/dL (ref 0.20–1.20)
Total Protein: 7.1 g/dL (ref 6.4–8.3)

## 2015-08-27 LAB — LACTATE DEHYDROGENASE: LDH: 278 U/L — ABNORMAL HIGH (ref 125–245)

## 2015-08-27 MED ORDER — SODIUM CHLORIDE 0.9 % IV SOLN
2.0000 mg/kg | Freq: Once | INTRAVENOUS | Status: AC
  Administered 2015-08-27: 100 mg via INTRAVENOUS
  Filled 2015-08-27: qty 4

## 2015-08-27 MED ORDER — SODIUM CHLORIDE 0.9 % IV SOLN
Freq: Once | INTRAVENOUS | Status: AC
  Administered 2015-08-27: 10:00:00 via INTRAVENOUS

## 2015-08-27 NOTE — Progress Notes (Signed)
[place]  [phone] [phone]     ID: Danielle [name] DOB: [birth year]  MR#: [id number]  QMV#:[id number]  Patient Care Team: Bartholome Bill, MD as PCP - General (Family Medicine) Armandina Gemma, MD as Consulting Physician (General Surgery) Chauncey Cruel, MD as Consulting Physician (Oncology) Allyn Kenner, MD (Dermatology) Gaynelle Arabian, MD as Consulting Physician (Orthopedic Surgery) PCP: Bartholome Bill, MD GYN: OTHER MD:  CHIEF COMPLAINT: Estrogen receptor positive breast cancer; stage IV malignant melanoma  CURRENT TREATMENT: Anastrozole, pembrolizumab   BREAST CANCER HISTORY: From the original intake note:  Danielle [name] she had an itchy spot in the middle of her back which she scratched sometimes, but this was not evaluated until [facility] fell off a chair while changing a light bulb and broke her left upper extremity. Because she couldn't change while in a sling, her daughter Danielle [name] had 2 give her a hand and in the process noted a black lesion in the middle of Danielle [name]'s back. She took married to Dr. [name] office where on 04/17/2015 he performed a shave biopsy of a pigmented lesion over the spine in the upper back. The pathology report ([id number]) showed a breast low depth of at least 6.5 mm, with the anatomic level at least 4. There were 23 mitoses per square millimeter and extensive ulceration. There was no satellitosis. There was no evidence of regression. They host response was not brisk. This was read as a pathology stage T4b and the patient was referred to [name] for wide excision.  However on exam Dr. jerking noted bilateral axillary adenopathy and also a mass in the upper inner quadrant of the left breast. He set Danielle [name] up for bilateral mammography with tomography 06/06/2015, and this showed an irregular hyperdense mass in the left breast upper inner quadrant which was palpable. There was also a firm palpable mass in the left axilla as well as in  the contralateral, right axilla. Ultrasonography showed the left breast mass to measure 2.7 cm and at least 2 abnormally enlarged lymph nodes in the left axilla the larger one measuring 3.9 cm. In the right axilla there were also 2 abnormal appearing lymph nodes, the larger measuring 2.6 cm.  On 06/06/2015 Danielle [name] underwent biopsy of the left breast mass and both axillary adenopathy areas. The left breast mass proved to be an invasive ductal carcinoma , grade 2, estrogen and progesterone receptor positive, both at 100%, with strong staining intensity. HER-2 was not amplified, with a signals ratio of 1.42, the average copy number per cell being 2.55. The proliferation marker was 5%.  More importantly, both axillary lymph nodes showed metastatic melanoma. We were contacted and prior to today's visit we obtained a PET scan, which shows in addition to the left breast mass and the bilateral axillary lymph nodes (described as just deep to the latissimus dorsi muscles bilaterally) 2 hypermetabolic foci in the left lobe of the liver, measuring 1.3 and 0.8 cm, with an SUV max of 6.3. A 1.5 cm mass next to the left [name], clearly separate from the adrenal gland, is also hypermetabolic with an SUV max of 8.2. There were no lung or bone lesions.  Danielle [name] subsequent history is as detailed below   INTERVAL HISTORY: Danielle [name] returns today for follow-up of her breast cancer and malignant melanoma, accompanied by her daughter Danielle [name] and her granddaughter Danielle [name], who is also a Marine scientist, working in [place] at the [employer] trauma unit..Today is day 1, cycle 3 of pembrolizumab. So far she is tolerating  the antibodies quite well, with minimal fatigue as her only side effect. She also tolerates the anastrozole with no side effects that she is aware of   REVIEW OF SYSTEMS: Danielle [name] has had no rash, change in bowel habits, or other unusual side effects. She is a little bit short of breath when walking up stairs, which is not a change from  baseline. Sometimes she feels a little nauseated. She has pain particularly in her right hand joints, which again is not a new problem. She is doing a lot of reading but [facility] Overall a detailed review of systems today is noncontributory  PAST MEDICAL HISTORY: No past medical history on file. Remote history of rectal prolapse with chronic constipation resulting, history of left humeral fracture, osteopenia  PAST SURGICAL HISTORY: Past Surgical History  Procedure Laterality Date  . Hip surgery      FAMILY HISTORY No family history on file. The patient's father died at the age of 25 in the setting of a call abuse. The patient's mother died from "old age" at 36. The patient had 4 brothers, no sisters. One brother died in his 64s from brain cancer. Another died from a stroke, and other from emphysema. The 38, youngest brother is 77 and jogs every day. There is no history of breast or ovarian cancer in the family. There is also no prior history of melanoma.  GYNECOLOGIC HISTORY:  No LMP recorded. Patient is postmenopausal. Menarche age 20, first live birth age 45, the patient is Danielle [name] P4. She stopped having periods in her early 46s. She did not use hormone replacement. She never took oral contraceptives.  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 She is widowed. She is now retired, and lives alone, with no pets. Her son Danielle [name] lives in [place], and is a retired Optometrist. Son Danielle [name] lives in [place] and is a Chief of Staff. Daughter Danielle [name] is a Marine scientist at [employer] locally. The fourth child, Danielle [name], died in an automobile accident at age 71. The patient has 4 grandchildren, no great grandchildren. She is a Corporate treasurer. She attends services at [employer].    ADVANCED DIRECTIVES: Not yet notarized; Danielle [name] intends to name her daughter Danielle [name] as her healthcare power of attorney. She can be  reached at [phone]/[phone]/2007   HEALTH MAINTENANCE: Social History  Substance Use Topics  . Smoking status: Never Smoker   . Smokeless tobacco: Not on file  . Alcohol Use: No     Colonoscopy: Remote  PAP:  Bone density: Remote  Lipid panel:  Allergies  Allergen Reactions  . Vicodin [Hydrocodone-Acetaminophen] Nausea Only    Current Outpatient Prescriptions  Medication Sig Dispense Refill  . anastrozole (ARIMIDEX) 1 MG tablet Take 1 tablet (1 mg total) by mouth daily. 90 tablet 4  . aspirin EC 81 MG tablet Take by mouth.    . enalapril (VASOTEC) 20 MG tablet Take 20 mg by mouth 2 (two) times daily.     . Lutein 20 MG CAPS Take by mouth.    . ondansetron (ZOFRAN ODT) 8 MG disintegrating tablet Take 1 tablet (8 mg total) by mouth every 8 (eight) hours as needed for nausea or vomiting. (Patient not taking: Reported on 07/01/2015) 15 tablet 0  . Red Yeast Rice Extract (RED YEAST RICE PO) Take by mouth.    . triamterene-hydrochlorothiazide (DYAZIDE) 37.5-25 MG capsule Take 1 capsule  by mouth daily.     . verapamil (CALAN-SR) 240 MG CR tablet   0   No current facility-administered medications for this visit.    OBJECTIVE: Older white woman In no acute distress Filed Vitals:   08/27/15 0843  BP: 144/57  Pulse: 77  Temp: 97.8 F (36.6 C)  Resp: 18     Body mass index is 20.62 kg/(m^2).    ECOG FS:1 - Symptomatic but completely ambulatory  Sclerae unicteric, EOMs intact Oropharynx clear, dentition in good repair No cervical or supraclavicular adenopathy; no axillary adenopathy Lungs no rales or rhonchi Heart regular rate and rhythm Abd soft, nontender, positive bowel sounds MSK kyphosis but no focal spinal tenderness, no upper extremity lymphedema Neuro: nonfocal, well oriented, appropriate affect Breasts: Deferred   LAB RESULTS:  CMP     Component Value Date/Time   NA 140 08/14/2015 1314   NA 134* 07/30/2006 1318   K 4.3 08/14/2015 1314   K 4.3 07/30/2006 1318    CL 100 07/30/2006 1318   CO2 28 08/14/2015 1314   CO2 24 07/30/2006 1318   GLUCOSE 128 08/14/2015 1314   GLUCOSE 102* 07/30/2006 1318   BUN 18.8 08/14/2015 1314   BUN 19 07/30/2006 1318   CREATININE 0.8 08/14/2015 1314   CREATININE 0.7 07/30/2006 1318   CALCIUM 10.8* 08/14/2015 1314   CALCIUM 9.4 07/30/2006 1318   PROT 7.2 08/14/2015 1314   ALBUMIN 3.9 08/14/2015 1314   AST 21 08/14/2015 1314   ALT 14 08/14/2015 1314   ALKPHOS 64 08/14/2015 1314   BILITOT <0.30 08/14/2015 1314   GFRNONAA 86 07/30/2006 1318   GFRAA 104 07/30/2006 1318    INo results found for: SPEP, UPEP  Lab Results  Component Value Date   WBC 8.7 08/14/2015   NEUTROABS 7.1* 08/14/2015   HGB 12.4 08/14/2015   HCT 38.2 08/14/2015   MCV 84.7 08/14/2015   PLT 278 08/14/2015      Chemistry      Component Value Date/Time   NA 140 08/14/2015 1314   NA 134* 07/30/2006 1318   K 4.3 08/14/2015 1314   K 4.3 07/30/2006 1318   CL 100 07/30/2006 1318   CO2 28 08/14/2015 1314   CO2 24 07/30/2006 1318   BUN 18.8 08/14/2015 1314   BUN 19 07/30/2006 1318   CREATININE 0.8 08/14/2015 1314   CREATININE 0.7 07/30/2006 1318      Component Value Date/Time   CALCIUM 10.8* 08/14/2015 1314   CALCIUM 9.4 07/30/2006 1318   ALKPHOS 64 08/14/2015 1314   AST 21 08/14/2015 1314   ALT 14 08/14/2015 1314   BILITOT <0.30 08/14/2015 1314       No results found for: LABCA2  No components found for: LABCA125  No results for input(s): INR in the last 168 hours.  Urinalysis No results found for: COLORURINE, APPEARANCEUR, LABSPEC, PHURINE, GLUCOSEU, HGBUR, BILIRUBINUR, KETONESUR, PROTEINUR, UROBILINOGEN, NITRITE, LEUKOCYTESUR  STUDIES: No results found. EXAM: DUAL X-RAY ABSORPTIOMETRY (DXA) FOR BONE MINERAL DENSITY  IMPRESSION: Referring Physician: Chauncey Cruel  PATIENT: Name: Zahria, Ding Patient ID: [id number] Birth Date: Jun 07, 1925 Height: 63.0 in. Sex: Female Measured: 07/25/2015 Weight: 117.0  lbs. Indications: Advanced Age, Anastrazole, Breast Cancer History, Caucasian, Estrogen Deficient, Height Loss (781.91), History of Fracture (Adult) (V15.51), Left hip replaced, Postmenopausal Fractures: Humerus, Knee Treatments: Calcium (E943.0), Vitamin D (E933.5)  ASSESSMENT: The BMD measured at AP Spine L2-L4 is 0.778 g/cm2 with a T-score of -3.5. This patient is considered osteoporotic according to [facility]  Organization (WHO) criteria. L-1 was excluded due to degenerative changes. Left femur was excluded due to surgical hardware.  Site Region Measured Date Measured Age YA BMD Significant CHANGE T-score AP Spine L2-L4 07/25/2015 87.9 -3.5 0.778 g/cm2  Right Femur Total 07/25/2015 87.9 -2.5 0.689 g/cm2  World Health Organization [facility]) criteria for post-menopausal, Caucasian Women: Normal T-score at or above -1 SD Osteopenia T-score between -1 and -2.5 SD Osteoporosis T-score at or below -2.5 SD  ASSESSMENT: 82 y.o. [place] woman with concurrent breast cancer and melanoma  BREAST CANCER: (1) left breast upper inner quadrant biopsy 06/06/2015 shows a clinical T2 NX, stage II invasive ductal carcinoma , grade 2, estrogen receptor and progesterone receptor strongly positive, with an MIB-1 of 5%, and no HER-2 amplification, the signals ratio being 1.42 and the number per cell 2.55.   (2) anastrozole started 07/01/2015  MALIGNANT MELANOMA: (a) shave biopsy from the mid upper back 04/17/2015 shows invasive malignant melanoma, Breslow depth 6.5 mm plus, Clark's level 4+, with 23 mitoses per square millimeter, extensive ulceration,, with involved edges, and non-brisk host response  (i) biopsy of right and left axillary lymph nodes 06/06/2015 showed metastatic melanoma, BRAF wild type  (ii) PET scan 07/01/2015 shows bilateral axillary adenopathy, a left latissimus mass, a left pararenal mass, and 2 liver lesions  (b) pembrolizumab  started 07/16/2015, repeated every 21 days   PLAN: Teasha is tolerating the pembrolizumab remarkably well. She appears to be having a good response, as I no longer and able to feel any axillary adenopathy.  She will proceed with her third cycle today and then receive a fourth cycle 3 weeks from now. After that she will be restaged.  Once we discussed the restaging studies we will return to her bone density, which we discussed today. She is severely osteoporotic. I think she would benefit from Prolia and the plan will be to start that at that point.  Recall she does not have a port. She will like the nurses to do the IV and draw her labs at the same time. This may of course slightly delay her start of treatment today.  Halleigh has a good understanding of this plan. She agrees with it. She will call with any problems that may develop before her next visit here.  Chauncey Cruel, MD   08/27/2015 8:59 AM

## 2015-08-27 NOTE — Patient Instructions (Signed)
Pembrolizumab injection What is this medicine? PEMBROLIZUMAB (pem broe liz ue mab) is a monoclonal antibody. It is used to treat melanoma and non-small cell lung cancer. This medicine may be used for other purposes; ask your health care provider or pharmacist if you have questions. What should I tell my health care provider before I take this medicine? They need to know if you have any of these conditions: -diabetes -immune system problems -inflammatory bowel disease -liver disease -lung or breathing disease -lupus -an unusual or allergic reaction to pembrolizumab, other medicines, foods, dyes, or preservatives -pregnant or trying to get pregnant -breast-feeding How should I use this medicine? This medicine is for infusion into a vein. It is given by a health care professional in a hospital or clinic setting. A special MedGuide will be given to you before each treatment. Be sure to read this information carefully each time. Talk to your pediatrician regarding the use of this medicine in children. Special care may be needed. Overdosage: If you think you have taken too much of this medicine contact a poison control center or emergency room at once. NOTE: This medicine is only for you. Do not share this medicine with others. What if I miss a dose? It is important not to miss your dose. Call your doctor or health care professional if you are unable to keep an appointment. What may interact with this medicine? Interactions have not been studied. Give your health care provider a list of all the medicines, herbs, non-prescription drugs, or dietary supplements you use. Also tell them if you smoke, drink alcohol, or use illegal drugs. Some items may interact with your medicine. This list may not describe all possible interactions. Give your health care provider a list of all the medicines, herbs, non-prescription drugs, or dietary supplements you use. Also tell them if you smoke, drink alcohol, or  use illegal drugs. Some items may interact with your medicine. What should I watch for while using this medicine? Your condition will be monitored carefully while you are receiving this medicine. You may need blood work done while you are taking this medicine. Do not become pregnant while taking this medicine or for 4 months after stopping it. Women should inform their doctor if they wish to become pregnant or think they might be pregnant. There is a potential for serious side effects to an unborn child. Talk to your health care professional or pharmacist for more information. Do not breast-feed an infant while taking this medicine or for 4 months after the last dose. What side effects may I notice from receiving this medicine? Side effects that you should report to your doctor or health care professional as soon as possible: -allergic reactions like skin rash, itching or hives, swelling of the face, lips, or tongue -bloody or black, tarry stools -breathing problems -change in the amount of urine -changes in vision -chest pain -chills -dark urine -dizziness or feeling faint or lightheaded -fast or irregular heartbeat -fever -flushing -hair loss -muscle pain -muscle weakness -persistent headache -signs and symptoms of high blood sugar such as dizziness; dry mouth; dry skin; fruity breath; nausea; stomach pain; increased hunger or thirst; increased urination -signs and symptoms of liver injury like dark urine, light-colored stools, loss of appetite, nausea, right upper belly pain, yellowing of the eyes or skin -stomach pain -weight loss Side effects that usually do not require medical attention (Report these to your doctor or health care professional if they continue or are bothersome.):constipation -cough -diarrhea -joint pain -  tiredness This list may not describe all possible side effects. Call your doctor for medical advice about side effects. You may report side effects to FDA at  1-800-FDA-1088. Where should I keep my medicine? This drug is given in a hospital or clinic and will not be stored at home. NOTE: This sheet is a summary. It may not cover all possible information. If you have questions about this medicine, talk to your doctor, pharmacist, or health care provider.    2016, Elsevier/Gold Standard. (2014-07-17 17:24:19)  [facility] Discharge Instructions for Patients Receiving Chemotherapy  Today you received the following chemotherapy agents Keytruda.  To help prevent nausea and vomiting after your treatment, we encourage you to take your nausea medication as directed.   If you develop nausea and vomiting that is not controlled by your nausea medication, call the clinic.   BELOW ARE SYMPTOMS THAT SHOULD BE REPORTED IMMEDIATELY:  *FEVER GREATER THAN 100.5 F  *CHILLS WITH OR WITHOUT FEVER  NAUSEA AND VOMITING THAT IS NOT CONTROLLED WITH YOUR NAUSEA MEDICATION  *UNUSUAL SHORTNESS OF BREATH  *UNUSUAL BRUISING OR BLEEDING  TENDERNESS IN MOUTH AND THROAT WITH OR WITHOUT PRESENCE OF ULCERS  *URINARY PROBLEMS  *BOWEL PROBLEMS  UNUSUAL RASH Items with * indicate a potential emergency and should be followed up as soon as possible.  Feel free to call the clinic you have any questions or concerns. The clinic phone number is [phone].  Please show the CHEMO ALERT CARD at check-in to the Emergency Department and triage nurse.    

## 2015-08-27 NOTE — Progress Notes (Signed)
Spiritual Care Note  Followed up with dtr Danielle [name] in lobby and with pt and visiting granddtr Danielle [name] in infusion.  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  Additional sources of meaning and purpose include recent visits from extended family and a plan to travel to [place] with a friend.  Provided pastoral presence and reflective listening, serving as a witness to these portions of her life story.  Family aware of ongoing chaplain availability for support, but please also page as needs arise.  Thank you.  [place], [place], [facility] Pager [phone] Voicemail  [phone]

## 2015-08-28 ENCOUNTER — Telehealth: Payer: Self-pay | Admitting: Nurse Practitioner

## 2015-08-28 NOTE — Telephone Encounter (Signed)
Spoke with patient daughter ti inform her of next appt date/times. Pt daughter wanted to speak with the dessk nurse and providers to try and accommodate the sch preferance

## 2015-09-17 ENCOUNTER — Ambulatory Visit (HOSPITAL_BASED_OUTPATIENT_CLINIC_OR_DEPARTMENT_OTHER): Payer: Medicare HMO

## 2015-09-17 ENCOUNTER — Encounter: Payer: Self-pay | Admitting: Nurse Practitioner

## 2015-09-17 ENCOUNTER — Other Ambulatory Visit (HOSPITAL_BASED_OUTPATIENT_CLINIC_OR_DEPARTMENT_OTHER): Payer: Medicare HMO

## 2015-09-17 ENCOUNTER — Telehealth: Payer: Self-pay | Admitting: Oncology

## 2015-09-17 ENCOUNTER — Ambulatory Visit (HOSPITAL_BASED_OUTPATIENT_CLINIC_OR_DEPARTMENT_OTHER): Payer: Medicare HMO | Admitting: Nurse Practitioner

## 2015-09-17 VITALS — BP 153/51 | HR 76 | Temp 97.8°F | Resp 18 | Ht 64.0 in | Wt 120.9 lb

## 2015-09-17 DIAGNOSIS — C773 Secondary and unspecified malignant neoplasm of axilla and upper limb lymph nodes: Secondary | ICD-10-CM

## 2015-09-17 DIAGNOSIS — Z79811 Long term (current) use of aromatase inhibitors: Secondary | ICD-10-CM

## 2015-09-17 DIAGNOSIS — C438 Malignant melanoma of overlapping sites of skin: Secondary | ICD-10-CM

## 2015-09-17 DIAGNOSIS — C4359 Malignant melanoma of other part of trunk: Secondary | ICD-10-CM

## 2015-09-17 DIAGNOSIS — C439 Malignant melanoma of skin, unspecified: Secondary | ICD-10-CM

## 2015-09-17 DIAGNOSIS — Z5112 Encounter for antineoplastic immunotherapy: Secondary | ICD-10-CM

## 2015-09-17 DIAGNOSIS — C787 Secondary malignant neoplasm of liver and intrahepatic bile duct: Secondary | ICD-10-CM

## 2015-09-17 DIAGNOSIS — C50212 Malignant neoplasm of upper-inner quadrant of left female breast: Secondary | ICD-10-CM

## 2015-09-17 LAB — CBC WITH DIFFERENTIAL/PLATELET
BASO%: 0.2 % (ref 0.0–2.0)
Basophils Absolute: 0 10*3/uL (ref 0.0–0.1)
EOS%: 0.1 % (ref 0.0–7.0)
Eosinophils Absolute: 0 10*3/uL (ref 0.0–0.5)
HCT: 35.3 % (ref 34.8–46.6)
HGB: 11.6 g/dL (ref 11.6–15.9)
LYMPH#: 0.6 10*3/uL — AB (ref 0.9–3.3)
LYMPH%: 7 % — AB (ref 14.0–49.7)
MCH: 27.3 pg (ref 25.1–34.0)
MCHC: 32.8 g/dL (ref 31.5–36.0)
MCV: 83.3 fL (ref 79.5–101.0)
MONO#: 0.8 10*3/uL (ref 0.1–0.9)
MONO%: 10.1 % (ref 0.0–14.0)
NEUT%: 82.6 % — ABNORMAL HIGH (ref 38.4–76.8)
NEUTROS ABS: 6.6 10*3/uL — AB (ref 1.5–6.5)
PLATELETS: 328 10*3/uL (ref 145–400)
RBC: 4.24 10*6/uL (ref 3.70–5.45)
RDW: 13.1 % (ref 11.2–14.5)
WBC: 7.9 10*3/uL (ref 3.9–10.3)

## 2015-09-17 LAB — COMPREHENSIVE METABOLIC PANEL
ALBUMIN: 3.6 g/dL (ref 3.5–5.0)
ALK PHOS: 95 U/L (ref 40–150)
ALT: 9 U/L (ref 0–55)
ANION GAP: 11 meq/L (ref 3–11)
AST: 20 U/L (ref 5–34)
BUN: 24.1 mg/dL (ref 7.0–26.0)
CALCIUM: 9.9 mg/dL (ref 8.4–10.4)
CO2: 25 mEq/L (ref 22–29)
Chloride: 106 mEq/L (ref 98–109)
Creatinine: 0.8 mg/dL (ref 0.6–1.1)
EGFR: 62 mL/min/{1.73_m2} — AB (ref >90)
Glucose: 106 mg/dl (ref 70–140)
POTASSIUM: 4.1 meq/L (ref 3.5–5.1)
Sodium: 141 mEq/L (ref 136–145)
Total Bilirubin: <0.3 mg/dL (ref 0.20–1.20)
Total Protein: 7 g/dL (ref 6.4–8.3)

## 2015-09-17 LAB — TSH: TSH: 3.146 m(IU)/L (ref 0.308–3.960)

## 2015-09-17 LAB — LACTATE DEHYDROGENASE: LDH: 213 U/L (ref 125–245)

## 2015-09-17 MED ORDER — SODIUM CHLORIDE 0.9 % IV SOLN
2.0000 mg/kg | Freq: Once | INTRAVENOUS | Status: AC
  Administered 2015-09-17: 100 mg via INTRAVENOUS
  Filled 2015-09-17: qty 4

## 2015-09-17 MED ORDER — SODIUM CHLORIDE 0.9 % IV SOLN
Freq: Once | INTRAVENOUS | Status: AC
  Administered 2015-09-17: 10:00:00 via INTRAVENOUS

## 2015-09-17 NOTE — Patient Instructions (Signed)
[facility] Discharge Instructions for Patients Receiving Chemotherapy  Today you received the following chemotherapy agents:  Keytruda.  To help prevent nausea and vomiting after your treatment, we encourage you to take your nausea medication as prescribed.   If you develop nausea and vomiting that is not controlled by your nausea medication, call the clinic.   BELOW ARE SYMPTOMS THAT SHOULD BE REPORTED IMMEDIATELY:  *FEVER GREATER THAN 100.5 F  *CHILLS WITH OR WITHOUT FEVER  NAUSEA AND VOMITING THAT IS NOT CONTROLLED WITH YOUR NAUSEA MEDICATION  *UNUSUAL SHORTNESS OF BREATH  *UNUSUAL BRUISING OR BLEEDING  TENDERNESS IN MOUTH AND THROAT WITH OR WITHOUT PRESENCE OF ULCERS  *URINARY PROBLEMS  *BOWEL PROBLEMS  UNUSUAL RASH Items with * indicate a potential emergency and should be followed up as soon as possible.  Feel free to call the clinic you have any questions or concerns. The clinic phone number is [phone].  Please show the CHEMO ALERT CARD at check-in to the Emergency Department and triage nurse.   

## 2015-09-17 NOTE — Progress Notes (Signed)
[place]  [phone] [phone]) [phone]     ID: Danielle [name] DOB: April 30, 1926  MR#: [id number]  XGZ#:[id number]  Patient Care Team: Bartholome Bill, MD as PCP - General (Family Medicine) Armandina Gemma, MD as Consulting Physician (General Surgery) Chauncey Cruel, MD as Consulting Physician (Oncology) Allyn Kenner, MD (Dermatology) Gaynelle Arabian, MD as Consulting Physician (Orthopedic Surgery) PCP: Bartholome Bill, MD GYN: OTHER MD:  CHIEF COMPLAINT: Estrogen receptor positive breast cancer; stage IV malignant melanoma  CURRENT TREATMENT: Anastrozole, pembrolizumab   BREAST CANCER HISTORY: From the original intake note:  Danielle [name] she had an itchy spot in the middle of her back which she scratched sometimes, but this was not evaluated until [facility] fell off a chair while changing a light bulb and broke her left upper extremity. Because she couldn't change while in a sling, her daughter Danielle [name] had 2 give her a hand and in the process noted a black lesion in the middle of Danielle [name]'s back. She took married to Danielle [name] office where on 04/17/2015 he performed a shave biopsy of a pigmented lesion over the spine in the upper back. The pathology report ([id number]) showed a breast low depth of at least 6.5 mm, with the anatomic level at least 4. There were 23 mitoses per square millimeter and extensive ulceration. There was no satellitosis. There was no evidence of regression. They host response was not brisk. This was read as a pathology stage T4b and the patient was referred to Danielle [name] for wide excision.  However on exam Danielle. jerking noted bilateral axillary adenopathy and also a mass in the upper inner quadrant of the left breast. He set Danielle [name] up for bilateral mammography with tomography 06/06/2015, and this showed an irregular hyperdense mass in the left breast upper inner quadrant which was palpable. There was also a firm palpable mass in the left axilla as well as in  the contralateral, right axilla. Ultrasonography showed the left breast mass to measure 2.7 cm and at least 2 abnormally enlarged lymph nodes in the left axilla the larger one measuring 3.9 cm. In the right axilla there were also 2 abnormal appearing lymph nodes, the larger measuring 2.6 cm.  On 06/06/2015 Danielle [name] underwent biopsy of the left breast mass and both axillary adenopathy areas. The left breast mass proved to be an invasive ductal carcinoma , grade 2, estrogen and progesterone receptor positive, both at 100%, with strong staining intensity. HER-2 was not amplified, with a signals ratio of 1.42, the average copy number per cell being 2.55. The proliferation marker was 5%.  More importantly, both axillary lymph nodes showed metastatic melanoma. We were contacted and prior to today's visit we obtained a PET scan, which shows in addition to the left breast mass and the bilateral axillary lymph nodes (described as just deep to the latissimus dorsi muscles bilaterally) 2 hypermetabolic foci in the left lobe of the liver, measuring 1.3 and 0.8 cm, with an SUV max of 6.3. A 1.5 cm mass next to the left [name], clearly separate from the adrenal gland, is also hypermetabolic with an SUV max of 8.2. There were no lung or bone lesions.  Danielle [name] subsequent history is as detailed below   INTERVAL HISTORY: Danielle [name] returns today for follow-up of her breast cancer and malignant melanoma, accompanied by her daughter Danielle [name]. Today is day 1, cycle 4 of pembrolizumab. She is also on anastrozole daily. She tolerates both drugs well with few complaints.   REVIEW OF SYSTEMS: Danielle [name] denies  fevers or chills. She has had a "queezy" stomach her whole life. This is managed with ginger chews. She does not need the zofran. She is moving her bowels well. Her appetite is good. She has a mild cough, productive of clear phlegm. She has shortness of breath with exertion, but denies chest pain or palpitations. Her only pain is to her hands  and this is arthritis. She sleeps well. She denies unusual headaches, dizziness, or vision changes. A detailed review of systems is otherwise stable.  PAST MEDICAL HISTORY: No past medical history on file. Remote history of rectal prolapse with chronic constipation resulting, history of left humeral fracture, osteopenia  PAST SURGICAL HISTORY: Past Surgical History  Procedure Laterality Date  . Hip surgery      FAMILY HISTORY No family history on file. The patient's father died at the age of 65 in the setting of a call abuse. The patient's mother died from "old age" at 73. The patient had 4 brothers, no sisters. One brother died in his 15s from brain cancer. Another died from a stroke, and other from emphysema. The 66, youngest brother is 75 and jogs every day. There is no history of breast or ovarian cancer in the family. There is also no prior history of melanoma.  GYNECOLOGIC HISTORY:  No LMP recorded. Patient is postmenopausal. Menarche age 48, first live birth age 74, the patient is [name]. She stopped having periods in her early 73s. She did not use hormone replacement. She never took oral contraceptives.  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 She is widowed. She is now retired, and lives alone, with no pets. Her son Danielle [name] lives in [place], and is a retired Optometrist. Son Danielle [name] lives in [name] and is a Chief of Staff. Daughter Danielle [name] is a Marine scientist at [employer] locally. The fourth child, Danielle [name], died in an automobile accident at age 17. The patient has 4 grandchildren, no great grandchildren. She is a Corporate treasurer. She attends services at [employer].    ADVANCED DIRECTIVES: Not yet notarized; Danielle [name] intends to name her daughter Danielle [name] as her healthcare power of attorney. She can be reached at [phone]   HEALTH MAINTENANCE: Social History  Substance Use Topics  . Smoking status: Never  Smoker   . Smokeless tobacco: Not on file  . Alcohol Use: No     Colonoscopy: Remote  PAP:  Bone density: Remote  Lipid panel:  Allergies  Allergen Reactions  . Vicodin [Hydrocodone-Acetaminophen] Nausea Only    Current Outpatient Prescriptions  Medication Sig Dispense Refill  . anastrozole (ARIMIDEX) 1 MG tablet Take 1 tablet (1 mg total) by mouth daily. 90 tablet 4  . aspirin EC 81 MG tablet Take by mouth.    . enalapril (VASOTEC) 20 MG tablet Take 20 mg by mouth 2 (two) times daily.     . Lutein 20 MG CAPS Take by mouth.    . Red Yeast Rice Extract (RED YEAST RICE PO) Take by mouth.    . triamterene-hydrochlorothiazide (DYAZIDE) 37.5-25 MG capsule Take 1 capsule by mouth daily.     . verapamil (CALAN-SR) 240 MG CR tablet   0  . ondansetron (ZOFRAN ODT) 8 MG disintegrating tablet Take 1 tablet (8 mg total) by mouth every 8 (eight) hours as needed for nausea or vomiting. (Patient not taking: Reported on 07/01/2015) 15 tablet 0   No current facility-administered medications for this visit.  OBJECTIVE: Older white [name] In no acute distress Filed Vitals:   09/17/15 0921  BP: 153/51  Pulse: 76  Temp: 97.8 F (36.6 C)  Resp: 18     Body mass index is 20.74 kg/(m^2).    ECOG FS:1 - Symptomatic but completely ambulatory  Skin: warm, dry  HEENT: sclerae anicteric, conjunctivae pink, oropharynx clear. No thrush or mucositis.  Lymph Nodes: No cervical or supraclavicular lymphadenopathy. No palpable bilateral axillary adenopathy Lungs: clear to auscultation bilaterally, no rales, wheezes, or rhonci  Heart: regular rate and rhythm  Abdomen: round, soft, non tender, positive bowel sounds  Musculoskeletal: No focal spinal tenderness, no peripheral edema  Neuro: non focal, well oriented, positive affect  Breasts: deferred.  LAB RESULTS:  CMP     Component Value Date/Time   NA 141 09/17/2015 0856   NA 134* 07/30/2006 1318   K 4.1 09/17/2015 0856   K 4.3 07/30/2006 1318    CL 100 07/30/2006 1318   CO2 25 09/17/2015 0856   CO2 24 07/30/2006 1318   GLUCOSE 106 09/17/2015 0856   GLUCOSE 102* 07/30/2006 1318   BUN 24.1 09/17/2015 0856   BUN 19 07/30/2006 1318   CREATININE 0.8 09/17/2015 0856   CREATININE 0.7 07/30/2006 1318   CALCIUM 9.9 09/17/2015 0856   CALCIUM 9.4 07/30/2006 1318   PROT 7.0 09/17/2015 0856   ALBUMIN 3.6 09/17/2015 0856   AST 20 09/17/2015 0856   ALT 9 09/17/2015 0856   ALKPHOS 95 09/17/2015 0856   BILITOT <0.30 09/17/2015 0856   GFRNONAA 86 07/30/2006 1318   GFRAA 104 07/30/2006 1318    INo results found for: SPEP, UPEP  Lab Results  Component Value Date   WBC 7.9 09/17/2015   NEUTROABS 6.6* 09/17/2015   HGB 11.6 09/17/2015   HCT 35.3 09/17/2015   MCV 83.3 09/17/2015   PLT 328 09/17/2015      Chemistry      Component Value Date/Time   NA 141 09/17/2015 0856   NA 134* 07/30/2006 1318   K 4.1 09/17/2015 0856   K 4.3 07/30/2006 1318   CL 100 07/30/2006 1318   CO2 25 09/17/2015 0856   CO2 24 07/30/2006 1318   BUN 24.1 09/17/2015 0856   BUN 19 07/30/2006 1318   CREATININE 0.8 09/17/2015 0856   CREATININE 0.7 07/30/2006 1318      Component Value Date/Time   CALCIUM 9.9 09/17/2015 0856   CALCIUM 9.4 07/30/2006 1318   ALKPHOS 95 09/17/2015 0856   AST 20 09/17/2015 0856   ALT 9 09/17/2015 0856   BILITOT <0.30 09/17/2015 0856       No results found for: LABCA2  No components found for: DGLOV564  No results for input(s): INR in the last 168 hours.  Urinalysis No results found for: COLORURINE, APPEARANCEUR, LABSPEC, PHURINE, GLUCOSEU, HGBUR, BILIRUBINUR, KETONESUR, PROTEINUR, UROBILINOGEN, NITRITE, LEUKOCYTESUR  STUDIES: No results found. EXAM: DUAL X-RAY ABSORPTIOMETRY (DXA) FOR BONE MINERAL DENSITY  IMPRESSION: Referring Physician: Chauncey Cruel  PATIENT: Name: Danielle [name], Danielle [name] Patient ID: [id number] Birth Date: Dec 24, 1925 Height: 63.0 in. Sex: Female Measured: 07/25/2015 Weight: 117.0  lbs. Indications: Advanced Age, Anastrazole, Breast Cancer History, Caucasian, Estrogen Deficient, Height Loss (781.91), History of Fracture (Adult) (V15.51), Left hip replaced, Postmenopausal Fractures: Humerus, Knee Treatments: Calcium (E943.0), Vitamin D (E933.5)  ASSESSMENT: The BMD measured at AP Spine L2-L4 is 0.778 g/cm2 with a T-score of -3.5. This patient is considered osteoporotic according to [facility]) criteria. L-1 was excluded due to degenerative changes.  Left femur was excluded due to surgical hardware.  Site Region Measured Date Measured Age YA BMD Significant CHANGE T-score AP Spine L2-L4 07/25/2015 87.9 -3.7 0.778 g/cm2  Right Femur Total 07/25/2015 87.9 -2.5 0.689 g/cm2  World Health Organization [facility]) criteria for post-menopausal, Caucasian Women: Normal T-score at or above -1 SD Osteopenia T-score between -1 and -2.5 SD Osteoporosis T-score at or below -2.5 SD  ASSESSMENT: 82 y.o. Danielle [name] with concurrent breast cancer and melanoma  BREAST CANCER: (1) left breast upper inner quadrant biopsy 06/06/2015 shows a clinical T2 NX, stage II invasive ductal carcinoma , grade 2, estrogen receptor and progesterone receptor strongly positive, with an MIB-1 of 5%, and no HER-2 amplification, the signals ratio being 1.42 and the number per cell 2.55.   (2) anastrozole started 07/01/2015  MALIGNANT MELANOMA: (a) shave biopsy from the mid upper back 04/17/2015 shows invasive malignant melanoma, Breslow depth 6.5 mm plus, Clark's level 4+, with 23 mitoses per square millimeter, extensive ulceration,, with involved edges, and non-brisk host response  (i) biopsy of right and left axillary lymph nodes 06/06/2015 showed metastatic melanoma, BRAF wild type  (ii) PET scan 07/01/2015 shows bilateral axillary adenopathy, a left latissimus mass, a left pararenal mass, and 2 liver lesions  (b) pembrolizumab  started 07/16/2015, repeated every 21 days   PLAN: Oluwakemi continues to tolerate treatment well, and judging by the lack of palpable axillary adenopathy, she seems to be responding to treatment well also. The labs were reviewed in detail and were stable. She will proceed with cycle 4 of pembrolizumab as planned today and continue on anastrozole daily.   Her daughter received a call from scheduling about her PET scan that should be performed in early May. I urged her to go ahead and return that call so that this scan might be done before their visit with Danielle. Jana Hakim.   Renelle will return in 3 weeks for follow up. During this visit she will review the results of her PET scan, and make a decision about bisphosphonate therapy for her osteoporosis. She understands and agrees with this plan. She has been encouraged to call with any issue that might arise before her next visit here.  Laurie Panda, NP   09/17/2015 10:02 AM

## 2015-09-17 NOTE — Telephone Encounter (Signed)
Gave patient dtr avs report and appointments for May and June. [facility] will call re pet scan - dtr aware.

## 2015-10-01 ENCOUNTER — Other Ambulatory Visit: Payer: Self-pay | Admitting: Oncology

## 2015-10-01 ENCOUNTER — Encounter (HOSPITAL_COMMUNITY)
Admission: RE | Admit: 2015-10-01 | Discharge: 2015-10-01 | Disposition: A | Discharge status: Home / Self Care | Payer: Medicare HMO | Source: Ambulatory Visit | Attending: Oncology | Admitting: Oncology

## 2015-10-01 DIAGNOSIS — C50212 Malignant neoplasm of upper-inner quadrant of left female breast: Secondary | ICD-10-CM

## 2015-10-01 DIAGNOSIS — C438 Malignant melanoma of overlapping sites of skin: Secondary | ICD-10-CM | POA: Insufficient documentation

## 2015-10-01 LAB — GLUCOSE, CAPILLARY: GLUCOSE-CAPILLARY: 74 mg/dL (ref 65–99)

## 2015-10-01 MED ORDER — FLUDEOXYGLUCOSE F - 18 (FDG) INJECTION
6.4000 | Freq: Once | INTRAVENOUS | Status: AC | PRN
  Administered 2015-10-01: 6.4 via INTRAVENOUS

## 2015-10-08 ENCOUNTER — Other Ambulatory Visit: Payer: Medicare HMO

## 2015-10-08 ENCOUNTER — Ambulatory Visit: Payer: Medicare HMO

## 2015-10-10 ENCOUNTER — Other Ambulatory Visit (HOSPITAL_BASED_OUTPATIENT_CLINIC_OR_DEPARTMENT_OTHER): Payer: Medicare HMO

## 2015-10-10 ENCOUNTER — Telehealth: Payer: Self-pay | Admitting: Oncology

## 2015-10-10 ENCOUNTER — Ambulatory Visit (HOSPITAL_BASED_OUTPATIENT_CLINIC_OR_DEPARTMENT_OTHER): Payer: Medicare HMO

## 2015-10-10 ENCOUNTER — Ambulatory Visit (HOSPITAL_BASED_OUTPATIENT_CLINIC_OR_DEPARTMENT_OTHER): Payer: Medicare HMO | Admitting: Oncology

## 2015-10-10 VITALS — BP 159/74 | HR 73 | Temp 98.0°F | Resp 18 | Ht 64.0 in | Wt 118.1 lb

## 2015-10-10 DIAGNOSIS — M810 Age-related osteoporosis without current pathological fracture: Secondary | ICD-10-CM

## 2015-10-10 DIAGNOSIS — C773 Secondary and unspecified malignant neoplasm of axilla and upper limb lymph nodes: Secondary | ICD-10-CM | POA: Diagnosis not present

## 2015-10-10 DIAGNOSIS — C4359 Malignant melanoma of other part of trunk: Secondary | ICD-10-CM

## 2015-10-10 DIAGNOSIS — C50212 Malignant neoplasm of upper-inner quadrant of left female breast: Secondary | ICD-10-CM

## 2015-10-10 DIAGNOSIS — Z5112 Encounter for antineoplastic immunotherapy: Secondary | ICD-10-CM | POA: Diagnosis not present

## 2015-10-10 DIAGNOSIS — C438 Malignant melanoma of overlapping sites of skin: Secondary | ICD-10-CM

## 2015-10-10 DIAGNOSIS — C439 Malignant melanoma of skin, unspecified: Secondary | ICD-10-CM

## 2015-10-10 DIAGNOSIS — C787 Secondary malignant neoplasm of liver and intrahepatic bile duct: Secondary | ICD-10-CM

## 2015-10-10 LAB — CBC WITH DIFFERENTIAL/PLATELET
BASO%: 0.2 % (ref 0.0–2.0)
Basophils Absolute: 0 10*3/uL (ref 0.0–0.1)
EOS%: 0 % (ref 0.0–7.0)
Eosinophils Absolute: 0 10*3/uL (ref 0.0–0.5)
HEMATOCRIT: 35 % (ref 34.8–46.6)
HEMOGLOBIN: 11.3 g/dL — AB (ref 11.6–15.9)
LYMPH%: 9.1 % — ABNORMAL LOW (ref 14.0–49.7)
MCH: 27.3 pg (ref 25.1–34.0)
MCHC: 32.3 g/dL (ref 31.5–36.0)
MCV: 84.5 fL (ref 79.5–101.0)
MONO#: 0.7 10*3/uL (ref 0.1–0.9)
MONO%: 8.1 % (ref 0.0–14.0)
NEUT%: 82.6 % — ABNORMAL HIGH (ref 38.4–76.8)
NEUTROS ABS: 7 10*3/uL — AB (ref 1.5–6.5)
PLATELETS: 292 10*3/uL (ref 145–400)
RBC: 4.14 10*6/uL (ref 3.70–5.45)
RDW: 12.8 % (ref 11.2–14.5)
WBC: 8.5 10*3/uL (ref 3.9–10.3)
lymph#: 0.8 10*3/uL — ABNORMAL LOW (ref 0.9–3.3)

## 2015-10-10 LAB — COMPREHENSIVE METABOLIC PANEL
ALBUMIN: 3.8 g/dL (ref 3.5–5.0)
ALK PHOS: 69 U/L (ref 40–150)
ALT: 12 U/L (ref 0–55)
ANION GAP: 11 meq/L (ref 3–11)
AST: 17 U/L (ref 5–34)
BUN: 24.9 mg/dL (ref 7.0–26.0)
CALCIUM: 10.2 mg/dL (ref 8.4–10.4)
CO2: 26 meq/L (ref 22–29)
Chloride: 104 mEq/L (ref 98–109)
Creatinine: 0.9 mg/dL (ref 0.6–1.1)
EGFR: 59 mL/min/{1.73_m2} — AB (ref >90)
Glucose: 102 mg/dl (ref 70–140)
Potassium: 4.1 mEq/L (ref 3.5–5.1)
Sodium: 140 mEq/L (ref 136–145)
TOTAL PROTEIN: 6.9 g/dL (ref 6.4–8.3)

## 2015-10-10 LAB — LACTATE DEHYDROGENASE: LDH: 194 U/L (ref 125–245)

## 2015-10-10 MED ORDER — SODIUM CHLORIDE 0.9 % IV SOLN
Freq: Once | INTRAVENOUS | Status: AC
  Administered 2015-10-10: 14:00:00 via INTRAVENOUS

## 2015-10-10 MED ORDER — SODIUM CHLORIDE 0.9 % IV SOLN
2.0000 mg/kg | Freq: Once | INTRAVENOUS | Status: AC
  Administered 2015-10-10: 100 mg via INTRAVENOUS
  Filled 2015-10-10: qty 4

## 2015-10-10 NOTE — Patient Instructions (Signed)
[facility] Discharge Instructions for Patients Receiving Chemotherapy  Today you received the following chemotherapy agents:  Keytruda.  To help prevent nausea and vomiting after your treatment, we encourage you to take your nausea medication as prescribed.   If you develop nausea and vomiting that is not controlled by your nausea medication, call the clinic.   BELOW ARE SYMPTOMS THAT SHOULD BE REPORTED IMMEDIATELY:  *FEVER GREATER THAN 100.5 F  *CHILLS WITH OR WITHOUT FEVER  NAUSEA AND VOMITING THAT IS NOT CONTROLLED WITH YOUR NAUSEA MEDICATION  *UNUSUAL SHORTNESS OF BREATH  *UNUSUAL BRUISING OR BLEEDING  TENDERNESS IN MOUTH AND THROAT WITH OR WITHOUT PRESENCE OF ULCERS  *URINARY PROBLEMS  *BOWEL PROBLEMS  UNUSUAL RASH Items with * indicate a potential emergency and should be followed up as soon as possible.  Feel free to call the clinic you have any questions or concerns. The clinic phone number is [phone].  Please show the CHEMO ALERT CARD at check-in to the Emergency Department and triage nurse.   

## 2015-10-10 NOTE — Telephone Encounter (Signed)
appt made and avs printed °

## 2015-10-10 NOTE — Progress Notes (Signed)
[facility]  [phone] [phone]     ID: Danielle [name] DOB: 01-25-1926  MR#: [id number]  QXI#:[id number]  Patient Care Team: Bartholome Bill, MD as PCP - General (Family Medicine) Armandina Gemma, MD as Consulting Physician (General Surgery) Chauncey Cruel, MD as Consulting Physician (Oncology) Allyn Kenner, MD (Dermatology) Gaynelle Arabian, MD as Consulting Physician (Orthopedic Surgery) PCP: Bartholome Bill, MD GYN: OTHER MD:  CHIEF COMPLAINT: Estrogen receptor positive breast cancer; stage IV malignant melanoma  CURRENT TREATMENT: Anastrozole, pembrolizumab   BREAST CANCER HISTORY: From the original intake note:  Danielle [name] she had an itchy spot in the middle of her back which she scratched sometimes, but this was not evaluated until [facility] fell off a chair while changing a light bulb and broke her left upper extremity. Because she couldn't change while in a sling, her daughter Danielle [name] had 2 give her a hand and in the process noted a black lesion in the middle of Danielle [name]'s back. She took married to Danielle [name] office where on 04/17/2015 he performed a shave biopsy of a pigmented lesion over the spine in the upper back. The pathology report ([id number]) showed a breast low depth of at least 6.5 mm, with the anatomic level at least 4. There were 23 mitoses per square millimeter and extensive ulceration. There was no satellitosis. There was no evidence of regression. They host response was not brisk. This was read as a pathology stage T4b and the patient was referred to Danielle [name] for wide excision.  However on exam Danielle. jerking noted bilateral axillary adenopathy and also a mass in the upper inner quadrant of the left breast. He set Danielle [name] up for bilateral mammography with tomography 06/06/2015, and this showed an irregular hyperdense mass in the left breast upper inner quadrant which was palpable. There was also a firm palpable mass in the left axilla as well as in  the contralateral, right axilla. Ultrasonography showed the left breast mass to measure 2.7 cm and at least 2 abnormally enlarged lymph nodes in the left axilla the larger one measuring 3.9 cm. In the right axilla there were also 2 abnormal appearing lymph nodes, the larger measuring 2.6 cm.  On 06/06/2015 Danielle [name] underwent biopsy of the left breast mass and both axillary adenopathy areas. The left breast mass proved to be an invasive ductal carcinoma , grade 2, estrogen and progesterone receptor positive, both at 100%, with strong staining intensity. HER-2 was not amplified, with a signals ratio of 1.42, the average copy number per cell being 2.55. The proliferation marker was 5%.  More importantly, both axillary lymph nodes showed metastatic melanoma. We were contacted and prior to today's visit we obtained a PET scan, which shows in addition to the left breast mass and the bilateral axillary lymph nodes (described as just deep to the latissimus dorsi muscles bilaterally) 2 hypermetabolic foci in the left lobe of the liver, measuring 1.3 and 0.8 cm, with an SUV max of 6.3. A 1.5 cm mass next to the left [name], clearly separate from the adrenal gland, is also hypermetabolic with an SUV max of 8.2. There were no lung or bone lesions.  Danielle [name] subsequent history is as detailed below   INTERVAL HISTORY: Danielle [name] returns today for follow-up of her metastatic malignant melanoma and her localized breast cancer,, accompanied by her daughter Danielle [name]. Today is day 1, cycle 5 of pembrolizumab. He tolerates this well,with no immune reaction, and only minimal temporary fatigue as a side effect.  She  is also on anastrozole daily. She reports no side effects related to that.  REVIEW OF SYSTEMS: Danielle [name] feels well. She has her "down days" and those days she wonders if she should just stop treatment and go ahead and "give up". Most of the time she is active and [facility] is [facility] she is developing some interesting  relationships.She does feel short of breath at times with activity and she has mild nausea which she treats with ginger. She has joint pains here and there which are not more intense or persistent than before. A detailed review of systems today was otherwise stable  PAST MEDICAL HISTORY: No past medical history on file. Remote history of rectal prolapse with chronic constipation resulting, history of left humeral fracture, osteopenia  PAST SURGICAL HISTORY: Past Surgical History  Procedure Laterality Date  . Hip surgery      FAMILY HISTORY No family history on file. The patient's father died at the age of 12 in the setting of a call abuse. The patient's mother died from "old age" at 57. The patient had 4 brothers, no sisters. One brother died in his 5s from brain cancer. Another died from a stroke, and other from emphysema. The 2, youngest brother is 5 and jogs every day. There is no history of breast or ovarian cancer in the family. There is also no prior history of melanoma.  GYNECOLOGIC HISTORY:  No LMP recorded. Patient is postmenopausal. Menarche age 72, first live birth age 88, the patient is Danielle [name] P4. She stopped having periods in her early 14s. She did not use hormone replacement. She never took oral contraceptives.  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 She is widowed. She is now retired, and lives alone, with no pets. Her son Danielle [name] lives in [place], and is a retired Optometrist. Son Danielle [name] lives in [place] and is a Chief of Staff. Daughter Danielle [name] is a Marine scientist at [employer] locally. The fourth child, Danielle [name], died in an automobile accident at age 71. The patient has 4 grandchildren, no great grandchildren. She is a Corporate treasurer. She attends services at [employer].    ADVANCED DIRECTIVES: Not yet notarized; Danielle [name] intends to name her daughter Danielle [name] as her healthcare power of attorney. She can be  reached at [phone]   HEALTH MAINTENANCE: Social History  Substance Use Topics  . Smoking status: Never Smoker   . Smokeless tobacco: Not on file  . Alcohol Use: No     Colonoscopy: Remote  PAP:  Bone density: Remote  Lipid panel:  Allergies  Allergen Reactions  . Vicodin [Hydrocodone-Acetaminophen] Nausea Only    Current Outpatient Prescriptions  Medication Sig Dispense Refill  . anastrozole (ARIMIDEX) 1 MG tablet Take 1 tablet (1 mg total) by mouth daily. 90 tablet 4  . aspirin EC 81 MG tablet Take by mouth.    . enalapril (VASOTEC) 20 MG tablet Take 20 mg by mouth 2 (two) times daily.     . Lutein 20 MG CAPS Take by mouth.    . ondansetron (ZOFRAN ODT) 8 MG disintegrating tablet Take 1 tablet (8 mg total) by mouth every 8 (eight) hours as needed for nausea or vomiting. (Patient not taking: Reported on 07/01/2015) 15 tablet 0  . Red Yeast Rice Extract (RED YEAST RICE PO) Take by mouth.    . triamterene-hydrochlorothiazide (DYAZIDE) 37.5-25 MG capsule Take 1 capsule by mouth daily.     Marland Kitchen  verapamil (CALAN-SR) 240 MG CR tablet   0   No current facility-administered medications for this visit.    OBJECTIVE: Older white woman who appears stated age  37 Vitals:   10/10/15 1240  BP: 159/74  Pulse: 73  Temp: 98 F (36.7 C)  Resp: 18     Body mass index is 20.26 kg/(m^2).    ECOG FS:1 - Symptomatic but completely ambulatory  Sclerae unicteric, EOMs intact Oropharynx clear, dentition in good repair No cervical or supraclavicular adenopathy Lungs no rales or rhonchi Heart regular rate and rhythm Abd soft, nontender, positive bowel sounds MSK kyphosis but no focal spinal tenderness, no upper extremity lymphedema Neuro: nonfocal, well oriented, appropriate affect Breasts:deferred    LAB RESULTS:  CMP     Component Value Date/Time   NA 141 09/17/2015 0856   NA 134* 07/30/2006 1318   K 4.1 09/17/2015 0856   K 4.3 07/30/2006 1318   CL 100 07/30/2006 1318    CO2 25 09/17/2015 0856   CO2 24 07/30/2006 1318   GLUCOSE 106 09/17/2015 0856   GLUCOSE 102* 07/30/2006 1318   BUN 24.1 09/17/2015 0856   BUN 19 07/30/2006 1318   CREATININE 0.8 09/17/2015 0856   CREATININE 0.7 07/30/2006 1318   CALCIUM 9.9 09/17/2015 0856   CALCIUM 9.4 07/30/2006 1318   PROT 7.0 09/17/2015 0856   ALBUMIN 3.6 09/17/2015 0856   AST 20 09/17/2015 0856   ALT 9 09/17/2015 0856   ALKPHOS 95 09/17/2015 0856   BILITOT <0.30 09/17/2015 0856   GFRNONAA 86 07/30/2006 1318   GFRAA 104 07/30/2006 1318    INo results found for: SPEP, UPEP  Lab Results  Component Value Date   WBC 8.5 10/10/2015   NEUTROABS 7.0* 10/10/2015   HGB 11.3* 10/10/2015   HCT 35.0 10/10/2015   MCV 84.5 10/10/2015   PLT 292 10/10/2015      Chemistry      Component Value Date/Time   NA 141 09/17/2015 0856   NA 134* 07/30/2006 1318   K 4.1 09/17/2015 0856   K 4.3 07/30/2006 1318   CL 100 07/30/2006 1318   CO2 25 09/17/2015 0856   CO2 24 07/30/2006 1318   BUN 24.1 09/17/2015 0856   BUN 19 07/30/2006 1318   CREATININE 0.8 09/17/2015 0856   CREATININE 0.7 07/30/2006 1318      Component Value Date/Time   CALCIUM 9.9 09/17/2015 0856   CALCIUM 9.4 07/30/2006 1318   ALKPHOS 95 09/17/2015 0856   AST 20 09/17/2015 0856   ALT 9 09/17/2015 0856   BILITOT <0.30 09/17/2015 0856       No results found for: LABCA2  No components found for: EPPIR518  No results for input(s): INR in the last 168 hours.  Urinalysis No results found for: COLORURINE, APPEARANCEUR, LABSPEC, PHURINE, GLUCOSEU, HGBUR, BILIRUBINUR, KETONESUR, PROTEINUR, UROBILINOGEN, NITRITE, LEUKOCYTESUR  STUDIES: Nm Pet Image Restage (ps) Whole Body  10/01/2015  CLINICAL DATA:  Subsequent treatment strategy for LEFT breast carcinoma. Additional history of malignant melanoma. EXAM: NUCLEAR MEDICINE PET WHOLE BODY TECHNIQUE: 6.4 mCi F-18 FDG was injected intravenously. Full-ring PET imaging was performed from the vertex to the  feet after the radiotracer. CT data was obtained and used for attenuation correction and anatomic localization. FASTING BLOOD GLUCOSE:  Value:  74 mg/dl COMPARISON:  None. FINDINGS: Head/Neck: No hypermetabolic lymph nodes in the neck. Chest: Hypermetabolic mass in the medial LEFT breast is decreased in metabolic activity with SUV max equals 3.7 decreased from 6.6. Hypermetabolic  bilateral posterior axillary lymph nodes are also decreased in metabolic activity. Example LEFT posterior axillary lymph node with SUV max equals 6.2 decreased from 11.4. Lymph node is also decreased in size measuring 8 mm short axis compared to 15. Similar decreased metabolic activity of RIGHT axillary lymph node with SUV max equal 8.6 decreased from 17. No new pulmonary nodules. Fine reticular pattern in the LEFT lower lobe appears chronic. Abdomen/Pelvis: Small hypermetabolic nodule implant medial to the spleen and superior to the LEFT [name] is decreased in size and metabolic activity and is barely measurable at 2 mm (125, series 4). No additional evidence of metastatic disease in the abdomen pelvis appear Skeleton: Hypermetabolic activity associated with the proximal LEFT humerus and shoulder musculature is persistent and intense but decreased from comparison exam with SUV max equal 23 decreased from 26. Underlying pathologic fracture noted. Extremities: No hypermetabolic activity to suggest metastasis. IMPRESSION: 1. Positive response to therapy with decrease in size and metabolic activity of large posterior axillary lymph nodes. 2. Decrease in metabolic activity of peritoneal metastasis in the LEFT suprarenal location. 3. Decreased metabolic activity of medial LEFT breast lesion 4. Persistent and intense hypermetabolic activity in the proximal LEFT humerus metastasis is decreased from comparison exam. Underlying pathologic fracture . Electronically Signed   By: Danielle [name] M.D.   On: 10/01/2015 15:45    ASSESSMENT: 82 y.o.  [place] woman with concurrent breast cancer and melanoma  BREAST CANCER: (1) left breast upper inner quadrant biopsy 06/06/2015 shows a clinical T2 NX, stage II invasive ductal carcinoma , grade 2, estrogen receptor and progesterone receptor strongly positive, with an MIB-1 of 5%, and no HER-2 amplification, the signals ratio being 1.42 and the number per cell 2.55.   (2) anastrozole started 07/01/2015  (a) bone density February 2017 shows osteoporosis with a T score of -2.5  MALIGNANT MELANOMA: (a) shave biopsy from the mid upper back 04/17/2015 shows invasive malignant melanoma, Breslow depth 6.5 mm plus, Clark's level 4+, with 23 mitoses per square millimeter, extensive ulceration,, with involved edges, and non-brisk host response  (i) biopsy of right and left axillary lymph nodes 06/06/2015 showed metastatic melanoma, BRAF wild type  (ii) PET scan 07/01/2015 shows bilateral axillary adenopathy, a left latissimus mass, a left pararenal mass, and 2 liver lesions  (b) pembrolizumab started 07/16/2015, repeated every 21 days   PLAN: Danielle [name] is having a significant response to the pembrolizumab. She is tolerating it well.The plan is to continue that another 4 doses and then again restage. If we can get to a complete response we would go 2 cycles beyond that before stopping. Otherwise we would consider stopping when there is no further evidence of response  Her breast cancer is also significantly improved. Of course she is on anastrozole for that. At some point she will need a lumpectomy.  She has had some [place] regarding treatment because it isshe feels a stress on her daughter who has to bring her. The treatments also interrupt [facility] [employer] which are in the morning. She strongly prefers to be treated in the afternoon and we will try to arrange for that.  Otherwise I am delighted at how well she is doing.he will see me again in the second week in August and she will have a  restaging PET scan a few days before that  Chauncey Cruel, MD   10/10/2015 12:54 PM

## 2015-10-29 ENCOUNTER — Other Ambulatory Visit: Payer: Medicare HMO

## 2015-10-29 ENCOUNTER — Ambulatory Visit: Payer: Medicare HMO

## 2015-10-31 ENCOUNTER — Ambulatory Visit: Payer: Medicare HMO

## 2015-10-31 ENCOUNTER — Other Ambulatory Visit: Payer: Medicare HMO

## 2015-11-05 ENCOUNTER — Other Ambulatory Visit (HOSPITAL_BASED_OUTPATIENT_CLINIC_OR_DEPARTMENT_OTHER): Payer: Medicare HMO

## 2015-11-05 ENCOUNTER — Other Ambulatory Visit: Payer: Self-pay | Admitting: *Deleted

## 2015-11-05 ENCOUNTER — Other Ambulatory Visit: Payer: Self-pay | Admitting: Oncology

## 2015-11-05 ENCOUNTER — Ambulatory Visit (HOSPITAL_BASED_OUTPATIENT_CLINIC_OR_DEPARTMENT_OTHER): Payer: Medicare HMO

## 2015-11-05 VITALS — BP 105/48 | HR 61 | Temp 98.3°F | Resp 18

## 2015-11-05 DIAGNOSIS — C438 Malignant melanoma of overlapping sites of skin: Secondary | ICD-10-CM

## 2015-11-05 DIAGNOSIS — C50212 Malignant neoplasm of upper-inner quadrant of left female breast: Secondary | ICD-10-CM

## 2015-11-05 DIAGNOSIS — Z79899 Other long term (current) drug therapy: Secondary | ICD-10-CM

## 2015-11-05 DIAGNOSIS — C773 Secondary and unspecified malignant neoplasm of axilla and upper limb lymph nodes: Secondary | ICD-10-CM

## 2015-11-05 DIAGNOSIS — Z5112 Encounter for antineoplastic immunotherapy: Secondary | ICD-10-CM

## 2015-11-05 DIAGNOSIS — C4359 Malignant melanoma of other part of trunk: Secondary | ICD-10-CM

## 2015-11-05 DIAGNOSIS — C787 Secondary malignant neoplasm of liver and intrahepatic bile duct: Secondary | ICD-10-CM

## 2015-11-05 DIAGNOSIS — C439 Malignant melanoma of skin, unspecified: Secondary | ICD-10-CM

## 2015-11-05 LAB — CBC WITH DIFFERENTIAL/PLATELET
BASO%: 0.2 % (ref 0.0–2.0)
Basophils Absolute: 0 10*3/uL (ref 0.0–0.1)
EOS ABS: 0 10*3/uL (ref 0.0–0.5)
EOS%: 0 % (ref 0.0–7.0)
HCT: 37.6 % (ref 34.8–46.6)
HEMOGLOBIN: 12.3 g/dL (ref 11.6–15.9)
LYMPH%: 11.8 % — ABNORMAL LOW (ref 14.0–49.7)
MCH: 27.6 pg (ref 25.1–34.0)
MCHC: 32.7 g/dL (ref 31.5–36.0)
MCV: 84.3 fL (ref 79.5–101.0)
MONO#: 0.7 10*3/uL (ref 0.1–0.9)
MONO%: 7.2 % (ref 0.0–14.0)
NEUT%: 80.8 % — ABNORMAL HIGH (ref 38.4–76.8)
NEUTROS ABS: 7.3 10*3/uL — AB (ref 1.5–6.5)
PLATELETS: 282 10*3/uL (ref 145–400)
RBC: 4.46 10*6/uL (ref 3.70–5.45)
RDW: 12.9 % (ref 11.2–14.5)
WBC: 9.1 10*3/uL (ref 3.9–10.3)
lymph#: 1.1 10*3/uL (ref 0.9–3.3)

## 2015-11-05 LAB — COMPREHENSIVE METABOLIC PANEL
ALBUMIN: 4 g/dL (ref 3.5–5.0)
ALK PHOS: 63 U/L (ref 40–150)
ALT: 10 U/L (ref 0–55)
ANION GAP: 9 meq/L (ref 3–11)
AST: 20 U/L (ref 5–34)
BILIRUBIN TOTAL: 0.3 mg/dL (ref 0.20–1.20)
BUN: 17.4 mg/dL (ref 7.0–26.0)
CO2: 25 meq/L (ref 22–29)
CREATININE: 0.8 mg/dL (ref 0.6–1.1)
Calcium: 10.7 mg/dL — ABNORMAL HIGH (ref 8.4–10.4)
Chloride: 104 mEq/L (ref 98–109)
EGFR: 69 mL/min/{1.73_m2} — AB (ref >90)
GLUCOSE: 84 mg/dL (ref 70–140)
Potassium: 4.4 mEq/L (ref 3.5–5.1)
SODIUM: 138 meq/L (ref 136–145)
TOTAL PROTEIN: 7.4 g/dL (ref 6.4–8.3)

## 2015-11-05 LAB — TSH: TSH: 24.86 m(IU)/L — ABNORMAL HIGH (ref 0.308–3.960)

## 2015-11-05 LAB — LACTATE DEHYDROGENASE: LDH: 213 U/L (ref 125–245)

## 2015-11-05 MED ORDER — LEVOTHYROXINE SODIUM 25 MCG PO TABS: 25.0000 ug | ORAL_TABLET | Freq: Every day | ORAL | Status: DC

## 2015-11-05 MED ORDER — SODIUM CHLORIDE 0.9 % IV SOLN
2.0000 mg/kg | Freq: Once | INTRAVENOUS | Status: AC
  Administered 2015-11-05: 100 mg via INTRAVENOUS
  Filled 2015-11-05: qty 4

## 2015-11-05 MED ORDER — SODIUM CHLORIDE 0.9 % IV SOLN
Freq: Once | INTRAVENOUS | Status: AC
  Administered 2015-11-05: 14:00:00 via INTRAVENOUS

## 2015-11-05 NOTE — Patient Instructions (Addendum)
[facility] Discharge Instructions for Patients Receiving Chemotherapy  Today you received the following chemotherapy agents:  Keytruda  To help prevent nausea and vomiting after your treatment, we encourage you to take your nausea medication as prescribed.   If you develop nausea and vomiting that is not controlled by your nausea medication, call the clinic.   BELOW ARE SYMPTOMS THAT SHOULD BE REPORTED IMMEDIATELY:  *FEVER GREATER THAN 100.5 F  *CHILLS WITH OR WITHOUT FEVER  NAUSEA AND VOMITING THAT IS NOT CONTROLLED WITH YOUR NAUSEA MEDICATION  *UNUSUAL SHORTNESS OF BREATH  *UNUSUAL BRUISING OR BLEEDING  TENDERNESS IN MOUTH AND THROAT WITH OR WITHOUT PRESENCE OF ULCERS  *URINARY PROBLEMS  *BOWEL PROBLEMS  UNUSUAL RASH Items with * indicate a potential emergency and should be followed up as soon as possible.  Feel free to call the clinic you have any questions or concerns. The clinic phone number is [phone].  Please show the [facility] at check-in to the Emergency Department and triage nurse.    Levothyroxine tablets What is this medicine? LEVOTHYROXINE (lee voe thye ROX een) is a thyroid hormone. This medicine can improve symptoms of thyroid deficiency such as slow speech, lack of energy, weight gain, hair loss, dry skin, and feeling cold. It also helps to treat goiter (an enlarged thyroid gland). It is also used to treat some kinds of thyroid cancer along with surgery and other medicines. This medicine may be used for other purposes; ask your health care provider or pharmacist if you have questions. What should I tell my health care provider before I take this medicine? They need to know if you have any of these conditions: -angina -blood clotting problems -diabetes -dieting or on a weight loss program -fertility problems -heart disease -high levels of thyroid hormone -pituitary gland problem -previous heart attack -an unusual or  allergic reaction to levothyroxine, thyroid hormones, other medicines, foods, dyes, or preservatives -pregnant or trying to get pregnant -breast-feeding How should I use this medicine? Take this medicine by mouth with plenty of water. It is best to take on an empty stomach, at least 30 minutes before or 2 hours after food. Follow the directions on the prescription label. Take at the same time each day. Do not take your medicine more often than directed. Contact your pediatrician regarding the use of this medicine in children. While this drug may be prescribed for children and infants as young as a few days of age for selected conditions, precautions do apply. For infants, you may crush the tablet and place in a small amount of (5-10 ml or 1 to 2 teaspoonfuls) of water, breast milk, or non-soy based infant formula. Do not mix with soy-based infant formula. Give as directed. Overdosage: If you think you have taken too much of this medicine contact a poison control center or emergency room at once. NOTE: This medicine is only for you. Do not share this medicine with others. What if I miss a dose? If you miss a dose, take it as soon as you can. If it is almost time for your next dose, take only that dose. Do not take double or extra doses. What may interact with this medicine? -amiodarone -antacids -anti-thyroid medicines -calcium supplements -carbamazepine -cholestyramine -colestipol -digoxin -female hormones, including contraceptive or birth control pills -iron supplements -ketamine -liquid nutrition products like Ensure -medicines for colds and breathing difficulties -medicines for diabetes -medicines for mental depression -medicines or herbals used to decrease weight or appetite -phenobarbital  or other barbiturate medications -phenytoin -prednisone or other corticosteroids -rifabutin -rifampin -soy isoflavones -sucralfate -theophylline -warfarin This list may not describe all  possible interactions. Give your health care provider a list of all the medicines, herbs, non-prescription drugs, or dietary supplements you use. Also tell them if you smoke, drink alcohol, or use illegal drugs. Some items may interact with your medicine. What should I watch for while using this medicine? Be sure to take this medicine with plenty of fluids. Some tablets may cause choking, gagging, or difficulty swallowing from the tablet getting stuck in your throat. Most of these problems disappear if the medicine is taken with the right amount of water or other fluids. Do not switch brands of this medicine unless your health care professional agrees with the change. Ask questions if you are uncertain. You will need regular exams and occasional blood tests to check the response to treatment. If you are receiving this medicine for an underactive thyroid, it may be several weeks before you notice an improvement. Check with your doctor or health care professional if your symptoms do not improve. It may be necessary for you to take this medicine for the rest of your life. Do not stop using this medicine unless your doctor or health care professional advises you to. This medicine can affect blood sugar levels. If you have diabetes, check your blood sugar as directed. You may lose some of your hair when you first start treatment. With time, this usually corrects itself. If you are going to have surgery, tell your doctor or health care professional that you are taking this medicine. What side effects may I notice from receiving this medicine? Side effects that you should report to your doctor or health care professional as soon as possible: -allergic reactions like skin rash, itching or hives, swelling of the face, lips, or tongue -chest pain -excessive sweating or intolerance to heat -fast or irregular heartbeat -nervousness -skin rash or hives -swelling of ankles, feet, or legs -tremors Side effects  that usually do not require medical attention (report to your doctor or health care professional if they continue or are bothersome): -changes in appetite -changes in menstrual periods -diarrhea -hair loss -headache -trouble sleeping -weight loss This list may not describe all possible side effects. Call your doctor for medical advice about side effects. You may report side effects to FDA at 1-800-FDA-1088. Where should I keep my medicine? Keep out of the reach of children. Store at room temperature between 15 and 30 degrees C (59 and 86 degrees F). Protect from light and moisture. Keep container tightly closed. Throw away any unused medicine after the expiration date. NOTE: This sheet is a summary. It may not cover all possible information. If you have questions about this medicine, talk to your doctor, pharmacist, or health care provider.    2016, Elsevier/Gold Standard. (2008-08-24 14:28:07)

## 2015-11-26 ENCOUNTER — Ambulatory Visit (HOSPITAL_BASED_OUTPATIENT_CLINIC_OR_DEPARTMENT_OTHER): Payer: Medicare HMO

## 2015-11-26 ENCOUNTER — Other Ambulatory Visit: Payer: Self-pay | Admitting: Oncology

## 2015-11-26 ENCOUNTER — Other Ambulatory Visit (HOSPITAL_BASED_OUTPATIENT_CLINIC_OR_DEPARTMENT_OTHER): Payer: Medicare HMO

## 2015-11-26 VITALS — BP 150/54 | HR 66 | Temp 97.8°F | Resp 17

## 2015-11-26 DIAGNOSIS — E038 Other specified hypothyroidism: Secondary | ICD-10-CM

## 2015-11-26 DIAGNOSIS — C4359 Malignant melanoma of other part of trunk: Secondary | ICD-10-CM | POA: Diagnosis not present

## 2015-11-26 DIAGNOSIS — C438 Malignant melanoma of overlapping sites of skin: Secondary | ICD-10-CM

## 2015-11-26 DIAGNOSIS — Z79899 Other long term (current) drug therapy: Secondary | ICD-10-CM

## 2015-11-26 DIAGNOSIS — C439 Malignant melanoma of skin, unspecified: Secondary | ICD-10-CM

## 2015-11-26 DIAGNOSIS — C50212 Malignant neoplasm of upper-inner quadrant of left female breast: Secondary | ICD-10-CM

## 2015-11-26 DIAGNOSIS — Z5112 Encounter for antineoplastic immunotherapy: Secondary | ICD-10-CM | POA: Diagnosis not present

## 2015-11-26 DIAGNOSIS — C787 Secondary malignant neoplasm of liver and intrahepatic bile duct: Secondary | ICD-10-CM

## 2015-11-26 DIAGNOSIS — C773 Secondary and unspecified malignant neoplasm of axilla and upper limb lymph nodes: Secondary | ICD-10-CM

## 2015-11-26 LAB — COMPREHENSIVE METABOLIC PANEL
ALBUMIN: 3.9 g/dL (ref 3.5–5.0)
ALK PHOS: 66 U/L (ref 40–150)
ALT: 12 U/L (ref 0–55)
AST: 20 U/L (ref 5–34)
Anion Gap: 11 mEq/L (ref 3–11)
BUN: 19.7 mg/dL (ref 7.0–26.0)
CALCIUM: 10.2 mg/dL (ref 8.4–10.4)
CO2: 25 mEq/L (ref 22–29)
CREATININE: 0.8 mg/dL (ref 0.6–1.1)
Chloride: 102 mEq/L (ref 98–109)
EGFR: 67 mL/min/{1.73_m2} — ABNORMAL LOW (ref >90)
GLUCOSE: 133 mg/dL (ref 70–140)
Potassium: 4.1 mEq/L (ref 3.5–5.1)
SODIUM: 138 meq/L (ref 136–145)
TOTAL PROTEIN: 7.4 g/dL (ref 6.4–8.3)

## 2015-11-26 LAB — CBC WITH DIFFERENTIAL/PLATELET
BASO%: 0.2 % (ref 0.0–2.0)
BASOS ABS: 0 10*3/uL (ref 0.0–0.1)
EOS%: 0 % (ref 0.0–7.0)
Eosinophils Absolute: 0 10*3/uL (ref 0.0–0.5)
HEMATOCRIT: 36.7 % (ref 34.8–46.6)
HEMOGLOBIN: 12.2 g/dL (ref 11.6–15.9)
LYMPH%: 9.2 % — AB (ref 14.0–49.7)
MCH: 27.5 pg (ref 25.1–34.0)
MCHC: 33.2 g/dL (ref 31.5–36.0)
MCV: 82.8 fL (ref 79.5–101.0)
MONO#: 0.6 10*3/uL (ref 0.1–0.9)
MONO%: 6.8 % (ref 0.0–14.0)
NEUT#: 6.9 10*3/uL — ABNORMAL HIGH (ref 1.5–6.5)
NEUT%: 83.8 % — AB (ref 38.4–76.8)
Platelets: 294 10*3/uL (ref 145–400)
RBC: 4.43 10*6/uL (ref 3.70–5.45)
RDW: 13.3 % (ref 11.2–14.5)
WBC: 8.2 10*3/uL (ref 3.9–10.3)
lymph#: 0.8 10*3/uL — ABNORMAL LOW (ref 0.9–3.3)
nRBC: 0 % (ref 0–0)

## 2015-11-26 LAB — LACTATE DEHYDROGENASE: LDH: 205 U/L (ref 125–245)

## 2015-11-26 MED ORDER — SODIUM CHLORIDE 0.9 % IV SOLN
2.0000 mg/kg | Freq: Once | INTRAVENOUS | Status: AC
  Administered 2015-11-26: 100 mg via INTRAVENOUS
  Filled 2015-11-26: qty 4

## 2015-11-26 MED ORDER — SODIUM CHLORIDE 0.9 % IV SOLN
Freq: Once | INTRAVENOUS | Status: AC
  Administered 2015-11-26: 15:00:00 via INTRAVENOUS

## 2015-11-26 NOTE — Patient Instructions (Signed)
[facility] Discharge Instructions for Patients Receiving Chemotherapy  Today you received the following chemotherapy agents keytruda   To help prevent nausea and vomiting after your treatment, we encourage you to take your nausea medication as directed  If you develop nausea and vomiting that is not controlled by your nausea medication, call the clinic.   BELOW ARE SYMPTOMS THAT SHOULD BE REPORTED IMMEDIATELY:  *FEVER GREATER THAN 100.5 F  *CHILLS WITH OR WITHOUT FEVER  NAUSEA AND VOMITING THAT IS NOT CONTROLLED WITH YOUR NAUSEA MEDICATION  *UNUSUAL SHORTNESS OF BREATH  *UNUSUAL BRUISING OR BLEEDING  TENDERNESS IN MOUTH AND THROAT WITH OR WITHOUT PRESENCE OF ULCERS  *URINARY PROBLEMS  *BOWEL PROBLEMS  UNUSUAL RASH Items with * indicate a potential emergency and should be followed up as soon as possible.  Feel free to call the clinic you have any questions or concerns. The clinic phone number is [phone].  

## 2015-11-27 LAB — TSH: TSH: 29.358 m(IU)/L — ABNORMAL HIGH (ref 0.308–3.960)

## 2015-12-04 ENCOUNTER — Telehealth: Payer: Self-pay | Admitting: *Deleted

## 2015-12-04 ENCOUNTER — Other Ambulatory Visit: Payer: Self-pay | Admitting: *Deleted

## 2015-12-04 DIAGNOSIS — C438 Malignant melanoma of overlapping sites of skin: Secondary | ICD-10-CM

## 2015-12-04 MED ORDER — LEVOTHYROXINE SODIUM 25 MCG PO TABS: 50.0000 ug | ORAL_TABLET | Freq: Every day | ORAL | Status: DC

## 2015-12-04 NOTE — Telephone Encounter (Signed)
This RN contacted pt's dtr who is her caregiver per MD request for pt to increase current synthroid. Verified dose as 25 mcg tablet daily, requested for dose to be increased to 2 tabs ( 50 mcg ) daily.   New prescription will be sent to pt's pharmacy for new dose.  Myra verbalized understanding- she will notify her mom " who gets very anxious with any changes or calls from the pharmacy "  This RN will request pt not to be contacted with prescription- daughter will arrange for pick up and communication.

## 2015-12-17 ENCOUNTER — Other Ambulatory Visit: Payer: Self-pay | Admitting: Oncology

## 2015-12-17 ENCOUNTER — Other Ambulatory Visit (HOSPITAL_BASED_OUTPATIENT_CLINIC_OR_DEPARTMENT_OTHER): Payer: Medicare HMO

## 2015-12-17 ENCOUNTER — Ambulatory Visit (HOSPITAL_BASED_OUTPATIENT_CLINIC_OR_DEPARTMENT_OTHER): Payer: Medicare HMO

## 2015-12-17 VITALS — BP 124/46 | HR 61 | Temp 97.8°F | Resp 18

## 2015-12-17 DIAGNOSIS — C438 Malignant melanoma of overlapping sites of skin: Secondary | ICD-10-CM

## 2015-12-17 DIAGNOSIS — C787 Secondary malignant neoplasm of liver and intrahepatic bile duct: Secondary | ICD-10-CM

## 2015-12-17 DIAGNOSIS — C50212 Malignant neoplasm of upper-inner quadrant of left female breast: Secondary | ICD-10-CM | POA: Diagnosis not present

## 2015-12-17 DIAGNOSIS — C439 Malignant melanoma of skin, unspecified: Secondary | ICD-10-CM

## 2015-12-17 DIAGNOSIS — C4359 Malignant melanoma of other part of trunk: Secondary | ICD-10-CM

## 2015-12-17 DIAGNOSIS — C773 Secondary and unspecified malignant neoplasm of axilla and upper limb lymph nodes: Secondary | ICD-10-CM | POA: Diagnosis not present

## 2015-12-17 DIAGNOSIS — Z5112 Encounter for antineoplastic immunotherapy: Secondary | ICD-10-CM | POA: Diagnosis not present

## 2015-12-17 LAB — CBC WITH DIFFERENTIAL/PLATELET
BASO%: 0.5 % (ref 0.0–2.0)
BASOS ABS: 0 10*3/uL (ref 0.0–0.1)
EOS ABS: 0 10*3/uL (ref 0.0–0.5)
EOS%: 0 % (ref 0.0–7.0)
HCT: 36.8 % (ref 34.8–46.6)
HEMOGLOBIN: 12 g/dL (ref 11.6–15.9)
LYMPH%: 7.8 % — AB (ref 14.0–49.7)
MCH: 26.9 pg (ref 25.1–34.0)
MCHC: 32.6 g/dL (ref 31.5–36.0)
MCV: 82.5 fL (ref 79.5–101.0)
MONO#: 0.7 10*3/uL (ref 0.1–0.9)
MONO%: 7.3 % (ref 0.0–14.0)
NEUT#: 8 10*3/uL — ABNORMAL HIGH (ref 1.5–6.5)
NEUT%: 84.4 % — ABNORMAL HIGH (ref 38.4–76.8)
PLATELETS: 269 10*3/uL (ref 145–400)
RBC: 4.46 10*6/uL (ref 3.70–5.45)
RDW: 14.1 % (ref 11.2–14.5)
WBC: 9.4 10*3/uL (ref 3.9–10.3)
lymph#: 0.7 10*3/uL — ABNORMAL LOW (ref 0.9–3.3)

## 2015-12-17 LAB — COMPREHENSIVE METABOLIC PANEL
ALBUMIN: 3.7 g/dL (ref 3.5–5.0)
ALK PHOS: 65 U/L (ref 40–150)
ALT: 10 U/L (ref 0–55)
ANION GAP: 12 meq/L — AB (ref 3–11)
AST: 19 U/L (ref 5–34)
BILIRUBIN TOTAL: 0.32 mg/dL (ref 0.20–1.20)
BUN: 25 mg/dL (ref 7.0–26.0)
CALCIUM: 9.9 mg/dL (ref 8.4–10.4)
CO2: 25 mEq/L (ref 22–29)
Chloride: 103 mEq/L (ref 98–109)
Creatinine: 0.9 mg/dL (ref 0.6–1.1)
EGFR: 60 mL/min/{1.73_m2} — AB (ref >90)
Glucose: 106 mg/dl (ref 70–140)
POTASSIUM: 4.3 meq/L (ref 3.5–5.1)
Sodium: 139 mEq/L (ref 136–145)
TOTAL PROTEIN: 7.2 g/dL (ref 6.4–8.3)

## 2015-12-17 LAB — TSH: TSH: 43.824 m[IU]/L — AB (ref 0.308–3.960)

## 2015-12-17 LAB — LACTATE DEHYDROGENASE: LDH: 205 U/L (ref 125–245)

## 2015-12-17 MED ORDER — SODIUM CHLORIDE 0.9 % IV SOLN
Freq: Once | INTRAVENOUS | Status: AC
  Administered 2015-12-17: 15:00:00 via INTRAVENOUS

## 2015-12-17 MED ORDER — SODIUM CHLORIDE 0.9 % IV SOLN
2.0000 mg/kg | Freq: Once | INTRAVENOUS | Status: AC
  Administered 2015-12-17: 100 mg via INTRAVENOUS
  Filled 2015-12-17: qty 4

## 2015-12-17 MED ORDER — LEVOTHYROXINE SODIUM 100 MCG PO TABS: 100.0000 ug | ORAL_TABLET | Freq: Every day | ORAL | Status: DC

## 2015-12-17 NOTE — Patient Instructions (Signed)
[facility] Discharge Instructions for Patients Receiving Chemotherapy  Today you received the following chemotherapy agents Keytruda  To help prevent nausea and vomiting after your treatment, we encourage you to take your nausea medication    If you develop nausea and vomiting that is not controlled by your nausea medication, call the clinic.   BELOW ARE SYMPTOMS THAT SHOULD BE REPORTED IMMEDIATELY:  *FEVER GREATER THAN 100.5 F  *CHILLS WITH OR WITHOUT FEVER  NAUSEA AND VOMITING THAT IS NOT CONTROLLED WITH YOUR NAUSEA MEDICATION  *UNUSUAL SHORTNESS OF BREATH  *UNUSUAL BRUISING OR BLEEDING  TENDERNESS IN MOUTH AND THROAT WITH OR WITHOUT PRESENCE OF ULCERS  *URINARY PROBLEMS  *BOWEL PROBLEMS  UNUSUAL RASH Items with * indicate a potential emergency and should be followed up as soon as possible.  Feel free to call the clinic you have any questions or concerns. The clinic phone number is [phone].  Please show the CHEMO ALERT CARD at check-in to the Emergency Department and triage nurse.   

## 2015-12-20 ENCOUNTER — Telehealth: Payer: Self-pay | Admitting: *Deleted

## 2015-12-20 NOTE — Telephone Encounter (Signed)
This RN called pt per MD request to follow up on new prescription for synthroid sent to her pharmacy.  Obtained identified VM- message left per above and to return call to this RN to verify understanding of new dose of synthroid at 100 mcg daily.

## 2016-01-02 ENCOUNTER — Ambulatory Visit (HOSPITAL_COMMUNITY)
Admission: RE | Admit: 2016-01-02 | Discharge: 2016-01-02 | Disposition: A | Discharge status: Home / Self Care | Payer: Medicare HMO | Source: Ambulatory Visit | Attending: Oncology | Admitting: Oncology

## 2016-01-02 DIAGNOSIS — M84412A Pathological fracture, left shoulder, initial encounter for fracture: Secondary | ICD-10-CM | POA: Diagnosis not present

## 2016-01-02 DIAGNOSIS — C438 Malignant melanoma of overlapping sites of skin: Secondary | ICD-10-CM | POA: Insufficient documentation

## 2016-01-02 DIAGNOSIS — C50212 Malignant neoplasm of upper-inner quadrant of left female breast: Secondary | ICD-10-CM

## 2016-01-02 LAB — GLUCOSE, CAPILLARY: Glucose-Capillary: 89 mg/dL (ref 65–99)

## 2016-01-02 MED ORDER — FLUDEOXYGLUCOSE F - 18 (FDG) INJECTION: 5.8000 | Freq: Once | INTRAVENOUS | Status: DC | PRN

## 2016-01-07 ENCOUNTER — Other Ambulatory Visit (HOSPITAL_BASED_OUTPATIENT_CLINIC_OR_DEPARTMENT_OTHER): Payer: Medicare HMO

## 2016-01-07 ENCOUNTER — Telehealth: Payer: Self-pay | Admitting: Oncology

## 2016-01-07 ENCOUNTER — Ambulatory Visit (HOSPITAL_BASED_OUTPATIENT_CLINIC_OR_DEPARTMENT_OTHER): Payer: Medicare HMO | Admitting: Oncology

## 2016-01-07 ENCOUNTER — Ambulatory Visit (HOSPITAL_BASED_OUTPATIENT_CLINIC_OR_DEPARTMENT_OTHER): Payer: Medicare HMO

## 2016-01-07 VITALS — BP 130/61 | HR 65 | Temp 97.7°F | Resp 18 | Ht 64.0 in | Wt 121.5 lb

## 2016-01-07 VITALS — BP 151/64 | HR 59 | Temp 98.0°F | Resp 16

## 2016-01-07 DIAGNOSIS — C438 Malignant melanoma of overlapping sites of skin: Secondary | ICD-10-CM

## 2016-01-07 DIAGNOSIS — Z79899 Other long term (current) drug therapy: Secondary | ICD-10-CM

## 2016-01-07 DIAGNOSIS — Z170 Estrogen receptor positive status [ER+]: Secondary | ICD-10-CM

## 2016-01-07 DIAGNOSIS — C4359 Malignant melanoma of other part of trunk: Secondary | ICD-10-CM

## 2016-01-07 DIAGNOSIS — C50212 Malignant neoplasm of upper-inner quadrant of left female breast: Secondary | ICD-10-CM

## 2016-01-07 DIAGNOSIS — C773 Secondary and unspecified malignant neoplasm of axilla and upper limb lymph nodes: Secondary | ICD-10-CM

## 2016-01-07 DIAGNOSIS — Z5112 Encounter for antineoplastic immunotherapy: Secondary | ICD-10-CM | POA: Diagnosis not present

## 2016-01-07 DIAGNOSIS — M810 Age-related osteoporosis without current pathological fracture: Secondary | ICD-10-CM | POA: Diagnosis not present

## 2016-01-07 DIAGNOSIS — C439 Malignant melanoma of skin, unspecified: Secondary | ICD-10-CM

## 2016-01-07 DIAGNOSIS — C787 Secondary malignant neoplasm of liver and intrahepatic bile duct: Secondary | ICD-10-CM

## 2016-01-07 LAB — COMPREHENSIVE METABOLIC PANEL
ALBUMIN: 3.6 g/dL (ref 3.5–5.0)
ALK PHOS: 63 U/L (ref 40–150)
ALT: 12 U/L (ref 0–55)
AST: 18 U/L (ref 5–34)
Anion Gap: 11 mEq/L (ref 3–11)
BUN: 22.6 mg/dL (ref 7.0–26.0)
CO2: 24 mEq/L (ref 22–29)
Calcium: 10.1 mg/dL (ref 8.4–10.4)
Chloride: 104 mEq/L (ref 98–109)
Creatinine: 0.8 mg/dL (ref 0.6–1.1)
EGFR: 67 mL/min/{1.73_m2} — AB (ref >90)
GLUCOSE: 96 mg/dL (ref 70–140)
POTASSIUM: 4 meq/L (ref 3.5–5.1)
SODIUM: 139 meq/L (ref 136–145)
Total Bilirubin: 0.3 mg/dL (ref 0.20–1.20)
Total Protein: 7.1 g/dL (ref 6.4–8.3)

## 2016-01-07 LAB — CBC WITH DIFFERENTIAL/PLATELET
BASO%: 0.3 % (ref 0.0–2.0)
Basophils Absolute: 0 10*3/uL (ref 0.0–0.1)
EOS%: 0 % (ref 0.0–7.0)
Eosinophils Absolute: 0 10*3/uL (ref 0.0–0.5)
HEMATOCRIT: 33.6 % — AB (ref 34.8–46.6)
HGB: 10.9 g/dL — ABNORMAL LOW (ref 11.6–15.9)
LYMPH#: 0.7 10*3/uL — AB (ref 0.9–3.3)
LYMPH%: 6.2 % — ABNORMAL LOW (ref 14.0–49.7)
MCH: 26.7 pg (ref 25.1–34.0)
MCHC: 32.6 g/dL (ref 31.5–36.0)
MCV: 81.8 fL (ref 79.5–101.0)
MONO#: 1 10*3/uL — ABNORMAL HIGH (ref 0.1–0.9)
MONO%: 8.1 % (ref 0.0–14.0)
NEUT#: 10.1 10*3/uL — ABNORMAL HIGH (ref 1.5–6.5)
NEUT%: 85.4 % — AB (ref 38.4–76.8)
Platelets: 302 10*3/uL (ref 145–400)
RBC: 4.1 10*6/uL (ref 3.70–5.45)
RDW: 13.5 % (ref 11.2–14.5)
WBC: 11.9 10*3/uL — ABNORMAL HIGH (ref 3.9–10.3)

## 2016-01-07 LAB — TSH: TSH: 8.689 m(IU)/L — ABNORMAL HIGH (ref 0.308–3.960)

## 2016-01-07 LAB — LACTATE DEHYDROGENASE: LDH: 190 U/L (ref 125–245)

## 2016-01-07 MED ORDER — SODIUM CHLORIDE 0.9 % IV SOLN
2.0000 mg/kg | Freq: Once | INTRAVENOUS | Status: AC
  Administered 2016-01-07: 100 mg via INTRAVENOUS
  Filled 2016-01-07: qty 4

## 2016-01-07 MED ORDER — SODIUM CHLORIDE 0.9 % IV SOLN
Freq: Once | INTRAVENOUS | Status: AC
  Administered 2016-01-07: 14:00:00 via INTRAVENOUS

## 2016-01-07 NOTE — Telephone Encounter (Signed)
appt made and avs printed °

## 2016-01-07 NOTE — Progress Notes (Signed)
[place]  [phone] [phone]     ID: Danielle [name] DOB: September 17, 1925  MR#: [id number]  HBZ#:[id number]  Patient Care Team: Bartholome Bill, MD as PCP - General (Family Medicine) Armandina Gemma, MD as Consulting Physician (General Surgery) Chauncey Cruel, MD as Consulting Physician (Oncology) Allyn Kenner, MD (Dermatology) Gaynelle Arabian, MD as Consulting Physician (Orthopedic Surgery) PCP: Bartholome Bill, MD GYN: OTHER MD:  CHIEF COMPLAINT: Estrogen receptor positive breast cancer; stage IV malignant melanoma  CURRENT TREATMENT: Anastrozole, pembrolizumab   BREAST CANCER HISTORY: From the original intake note:  Danielle [name] she had an itchy spot in the middle of her back which she scratched sometimes, but this was not evaluated until [facility] fell off a chair while changing a light bulb and broke her left upper extremity. Because she couldn't change while in a sling, her daughter Danielle [name] had 2 give her a hand and in the process noted a black lesion in the middle of Danielle [name]'s back. She took married to Dr. [name] office where on 04/17/2015 he performed a shave biopsy of a pigmented lesion over the spine in the upper back. The pathology report ([id number]) showed a breast low depth of at least 6.5 mm, with the anatomic level at least 4. There were 23 mitoses per square millimeter and extensive ulceration. There was no satellitosis. There was no evidence of regression. They host response was not brisk. This was read as a pathology stage T4b and the patient was referred to [name] for wide excision.  However on exam Dr. jerking noted bilateral axillary adenopathy and also a mass in the upper inner quadrant of the left breast. He set Danielle [name] up for bilateral mammography with tomography 06/06/2015, and this showed an irregular hyperdense mass in the left breast upper inner quadrant which was palpable. There was also a firm palpable mass in the left axilla as well as in  the contralateral, right axilla. Ultrasonography showed the left breast mass to measure 2.7 cm and at least 2 abnormally enlarged lymph nodes in the left axilla the larger one measuring 3.9 cm. In the right axilla there were also 2 abnormal appearing lymph nodes, the larger measuring 2.6 cm.  On 06/06/2015 Danielle [name] underwent biopsy of the left breast mass and both axillary adenopathy areas. The left breast mass proved to be an invasive ductal carcinoma , grade 2, estrogen and progesterone receptor positive, both at 100%, with strong staining intensity. HER-2 was not amplified, with a signals ratio of 1.42, the average copy number per cell being 2.55. The proliferation marker was 5%.  More importantly, both axillary lymph nodes showed metastatic melanoma. We were contacted and prior to today's visit we obtained a PET scan, which shows in addition to the left breast mass and the bilateral axillary lymph nodes (described as just deep to the latissimus dorsi muscles bilaterally) 2 hypermetabolic foci in the left lobe of the liver, measuring 1.3 and 0.8 cm, with an SUV max of 6.3. A 1.5 cm mass next to the left [name], clearly separate from the adrenal gland, is also hypermetabolic with an SUV max of 8.2. There were no lung or bone lesions.  Danielle [name] subsequent history is as detailed below   INTERVAL HISTORY: Danielle [name] returns today for follow-up of her breast cancer and metastatic malignant melanoma,, accompanied by her daughter Danielle [name]. Today is day 1, cycle 9 of pembrolizumab, which Danielle [name] receives every 21 days. She tolerates this remarkably well. She has had some fatigue, but that is improving (  and likely due to the thyroid dysfunction, which was noted approximately 2 months ago. She is now on 100 g of Synthroid daily with a significant improvement in the TSH.  Results for Danielle [name] (MRN [id number]) as of 01/07/2016 19:28  Ref. Range 09/17/2015 08:55 11/05/2015 12:49 11/26/2015 14:42 12/17/2015 13:56 01/07/2016 13:20  TSH  Latest Ref Range: 0.308 - 3.960 m(IU)/L 3.146 24.860 (H) 29.358 (H) 43.824 (H) 8.689 (H)   She Also continues on anastrozole, with good tolerance. Hot flashes and vaginal dryness are not a major issue. She never developed the arthralgias or myalgias that many patients can experience on this medication. She obtains it at a good price.  REVIEW OF SYSTEMS: Danielle [name]'s "was problem" is that his skin is itchy. She does not have a rash. She does have arthritis as well, especially involving the right upper extremity. She wonders whether the pembrolizumab has made this worse. Aside from these issues she remains remarkably independent and active, and "sharp" for her age. A detailed review of systems today was otherwise noncontributory  PAST MEDICAL HISTORY: No past medical history on file. Remote history of rectal prolapse with chronic constipation resulting, history of left humeral fracture, osteopenia  PAST SURGICAL HISTORY: Past Surgical History:  Procedure Laterality Date  . HIP SURGERY      FAMILY HISTORY No family history on file. The patient's father died at the age of 62 in the setting of a call abuse. The patient's mother died from "old age" at 17. The patient had 4 brothers, no sisters. One brother died in his 27s from brain cancer. Another died from a stroke, and other from emphysema. The 32, youngest brother is 67 and jogs every day. There is no history of breast or ovarian cancer in the family. There is also no prior history of melanoma.  GYNECOLOGIC HISTORY:  No LMP recorded. Patient is postmenopausal. Menarche age 37, first live birth age 36, the patient is Danielle [name] P4. She stopped having periods in her early 32s. She did not use hormone replacement. She never took oral contraceptives.  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 She is widowed. She is now retired, and lives alone, with no pets. Her son Danielle [name] lives in [place], and is a  retired Optometrist. Son Danielle [name] lives in [place] and is a Chief of Staff. Daughter Danielle [name] is a Marine scientist at [employer] locally. The fourth child, [name], died in an automobile accident at age 34. The patient has 4 grandchildren, no great grandchildren. She is a Corporate treasurer. She attends services at [employer].    ADVANCED DIRECTIVES: Not yet notarized; Rylei intends to name her daughter Danielle [name] as her healthcare power of attorney. She can be reached at [phone]   HEALTH MAINTENANCE: Social History  Substance Use Topics  . Smoking status: Never Smoker  . Smokeless tobacco: Not on file  . Alcohol use No     Colonoscopy: Remote  PAP:  Bone density: Remote  Lipid panel:  Allergies  Allergen Reactions  . Vicodin [Hydrocodone-Acetaminophen] Nausea Only    Current Outpatient Prescriptions  Medication Sig Dispense Refill  . anastrozole (ARIMIDEX) 1 MG tablet Take 1 tablet (1 mg total) by mouth daily. 90 tablet 4  . aspirin EC 81 MG tablet Take by mouth.    . enalapril (VASOTEC) 20 MG tablet Take 20 mg by mouth 2 (two) times daily.     Marland Kitchen levothyroxine (SYNTHROID) 100 MCG tablet  Take 1 tablet (100 mcg total) by mouth daily before breakfast. 30 tablet 6  . Lutein 20 MG CAPS Take by mouth.    . Red Yeast Rice Extract (RED YEAST RICE PO) Take by mouth.    . triamterene-hydrochlorothiazide (DYAZIDE) 37.5-25 MG capsule Take 1 capsule by mouth daily.     . verapamil (CALAN-SR) 240 MG CR tablet   0   No current facility-administered medications for this visit.    Facility-Administered Medications Ordered in Other Visits  Medication Dose Route Frequency Provider Last Rate Last Dose  . fludeoxyglucose F - 18 (FDG) injection 5.8 millicurie  5.8 millicurie Intravenous Once PRN Sabino Dick, MD        OBJECTIVE: Older white woman In no acute distress Vitals:   01/07/16 1240  BP: 130/61  Pulse: 65  Resp: 18  Temp: 97.7 F (36.5 C)     Body mass index is  20.86 kg/m.    ECOG FS:1 - Symptomatic but completely ambulatory  Sclerae unicteric, pupils round and equal Oropharynx clear and moist-- no thrush or other lesions No cervical or supraclavicular adenopathy Lungs no rales or rhonchi Heart regular rate and rhythm Abd soft, nontender, positive bowel sounds MSK no focal spinal tenderness, no upper extremity lymphedema Neuro: nonfocal, well oriented, appropriate affect Breasts: Deferred Skin no rash or other lesions noted.:     LAB RESULTS:  CMP     Component Value Date/Time   NA 139 01/07/2016 1227   K 4.0 01/07/2016 1227   CL 100 07/30/2006 1318   CO2 24 01/07/2016 1227   GLUCOSE 96 01/07/2016 1227   BUN 22.6 01/07/2016 1227   CREATININE 0.8 01/07/2016 1227   CALCIUM 10.1 01/07/2016 1227   PROT 7.1 01/07/2016 1227   ALBUMIN 3.6 01/07/2016 1227   AST 18 01/07/2016 1227   ALT 12 01/07/2016 1227   ALKPHOS 63 01/07/2016 1227   BILITOT 0.30 01/07/2016 1227   GFRNONAA 86 07/30/2006 1318   GFRAA 104 07/30/2006 1318    INo results found for: SPEP, UPEP  Lab Results  Component Value Date   WBC 11.9 (H) 01/07/2016   NEUTROABS 10.1 (H) 01/07/2016   HGB 10.9 (L) 01/07/2016   HCT 33.6 (L) 01/07/2016   MCV 81.8 01/07/2016   PLT 302 01/07/2016      Chemistry      Component Value Date/Time   NA 139 01/07/2016 1227   K 4.0 01/07/2016 1227   CL 100 07/30/2006 1318   CO2 24 01/07/2016 1227   BUN 22.6 01/07/2016 1227   CREATININE 0.8 01/07/2016 1227      Component Value Date/Time   CALCIUM 10.1 01/07/2016 1227   ALKPHOS 63 01/07/2016 1227   AST 18 01/07/2016 1227   ALT 12 01/07/2016 1227   BILITOT 0.30 01/07/2016 1227       No results found for: LABCA2  No components found for: LABCA125  No results for input(s): INR in the last 168 hours.  Urinalysis No results found for: COLORURINE, APPEARANCEUR, LABSPEC, PHURINE, GLUCOSEU, HGBUR, BILIRUBINUR, KETONESUR, PROTEINUR, UROBILINOGEN, NITRITE,  LEUKOCYTESUR  STUDIES: Nm Pet Image Restage (ps) Whole Body  Result Date: 01/02/2016 CLINICAL DATA:  Subsequent treatment strategy for malignant melanoma. Additional history of breast carcinoma. Immune therapy ongoing. EXAM: NUCLEAR MEDICINE PET WHOLE BODY TECHNIQUE: 5.8 mCi F-18 FDG was injected intravenously. Full-ring PET imaging was performed from the vertex to the feet after the radiotracer. CT data was obtained and used for attenuation correction and anatomic localization. FASTING BLOOD GLUCOSE:  Value:  89 mg/dl COMPARISON:  10/01/2015 FINDINGS: Head/Neck: No hypermetabolic lymph nodes in the neck. Chest: Small mass in the medial LEFT breast is similar metabolic activity SUV max of 3.2 compared to 3.7. Hypermetabolic lymph nodes in the posterior LEFT and RIGHT axilla are similar metabolic activity. LEFT axial lymph node with SUV max 7.7 compared to 6.2. RIGHT axial lymph node with SUV max 5.2 decreased from 8.6. RIGHT axial lymph node measures 13 mm not changed from from 13 mm (image 95, series 4). Diffuse activity in the thyroid gland likely represents thyroiditis. No suspicious pulmonary nodules. Abdomen/Pelvis: Low-density lesion liver without metabolic activity. Adrenal glands are normal. No abnormal hypermetabolic abdominal pelvic lymph nodes. Uterus and ovaries normal. Skeleton: Intense activity associated with the LEFT shoulder pathologic fracture. Activity remains intense with SUV max equal 26 compared to 23 and 26 on most recent two comparison PET-CT scans. Extremities: No evidence of metastatic disease IMPRESSION: 1. Essentially stable disease. Mild reduction in metabolic activity associated with the RIGHT axial lymph node is similar activity of the LEFT axial lymph node. 2. No change in moderate activity associated the medial LEFT breast mass. 3. No significant change in intense metabolic activity associated with pathologic fracture of the LEFT shoulder. 4. No evidence of new metastatic  disease. Electronically Signed   By: Suzy Bouchard M.D.   On: 01/02/2016 16:30    ASSESSMENT: 82 y.o. [place] woman with concurrent breast cancer and melanoma  BREAST CANCER: (1) left breast upper inner quadrant biopsy 06/06/2015 shows a clinical T2 NX, stage II invasive ductal carcinoma , grade 2, estrogen receptor and progesterone receptor strongly positive, with an MIB-1 of 5%, and no HER-2 amplification, the signals ratio being 1.42 and the number per cell 2.55.   (2) anastrozole started 07/01/2015  (a) bone density February 2017 shows osteoporosis with a T score of -2.5  MALIGNANT MELANOMA: (a) shave biopsy from the mid upper back 04/17/2015 shows invasive malignant melanoma, Breslow depth 6.5 mm plus, Clark's level 4+, with 23 mitoses per square millimeter, extensive ulceration,, with involved edges, and non-brisk host response  (i) biopsy of right and left axillary lymph nodes 06/06/2015 showed metastatic melanoma, BRAF wild type  (ii) PET scan 07/01/2015 shows bilateral axillary adenopathy, a left latissimus mass, a left pararenal mass, and 2 liver lesions  (b) pembrolizumab started 07/16/2015, repeated every 21 days  (i) increased TSH noted June 2018, on   PLAN: Mayla is now 6 months into treatment with pembrolizumab. Aside from the thyroid issue, she is tolerating this remarkably well. After an initial response she now has stable disease.  We discussed options, which included moving to more aggressive treatment, namely double therapy with Ipilimumab (Yervoy) and nilotinib (Optivo); moving to observation; or continuing pembrolizumab. My strong suggestion was to continue the current treatment, since she is generally tolerating it well, and the goal would be to continue for a total of 2 years and then consider going on observation.  Caoilainn has tentatively agreed to this. She does need to "think about it" but she agreed at least to continue treatment today.  The main problem she  has had is thyroid dysfunction. We have opted her Synthroid to 100, as of 3 weeks ago, and repeat TSH today is acceptable at 8.689. I will review this again in 3 weeks, and consider going to 112 or 125 g daily.  Otherwise unless Filippa changes her mind applying be to continue the pembrolizumab every 3 weeks and restage with a repeat PET  scan in December  Incidentally her last mammography was in January 2017. Given the fact that she has metastatic melanoma, I do not feel we need to obtain mammography every 6 months, as we would otherwise do in these cases. She will have her next mammogram January 2018.  [place] knows to call for any problems that may develop before her next visit here.   Chauncey Cruel, MD   01/07/2016 7:30 PM

## 2016-01-07 NOTE — Patient Instructions (Signed)
[facility] Discharge Instructions for Patients Receiving Chemotherapy  Today you received the following chemotherapy agents: Keytruda   To help prevent nausea and vomiting after your treatment, we encourage you to take your nausea medication as directed    If you develop nausea and vomiting that is not controlled by your nausea medication, call the clinic.   BELOW ARE SYMPTOMS THAT SHOULD BE REPORTED IMMEDIATELY:  *FEVER GREATER THAN 100.5 F  *CHILLS WITH OR WITHOUT FEVER  NAUSEA AND VOMITING THAT IS NOT CONTROLLED WITH YOUR NAUSEA MEDICATION  *UNUSUAL SHORTNESS OF BREATH  *UNUSUAL BRUISING OR BLEEDING  TENDERNESS IN MOUTH AND THROAT WITH OR WITHOUT PRESENCE OF ULCERS  *URINARY PROBLEMS  *BOWEL PROBLEMS  UNUSUAL RASH Items with * indicate a potential emergency and should be followed up as soon as possible.  Feel free to call the clinic you have any questions or concerns. The clinic phone number is [phone].  Please show the CHEMO ALERT CARD at check-in to the Emergency Department and triage nurse.   

## 2016-01-28 ENCOUNTER — Ambulatory Visit (HOSPITAL_BASED_OUTPATIENT_CLINIC_OR_DEPARTMENT_OTHER): Payer: Medicare HMO

## 2016-01-28 ENCOUNTER — Other Ambulatory Visit: Payer: Self-pay | Admitting: Oncology

## 2016-01-28 ENCOUNTER — Other Ambulatory Visit (HOSPITAL_BASED_OUTPATIENT_CLINIC_OR_DEPARTMENT_OTHER): Payer: Medicare HMO

## 2016-01-28 VITALS — BP 116/52 | HR 54 | Temp 97.5°F | Resp 18

## 2016-01-28 DIAGNOSIS — C50212 Malignant neoplasm of upper-inner quadrant of left female breast: Secondary | ICD-10-CM

## 2016-01-28 DIAGNOSIS — C439 Malignant melanoma of skin, unspecified: Secondary | ICD-10-CM

## 2016-01-28 DIAGNOSIS — C4359 Malignant melanoma of other part of trunk: Secondary | ICD-10-CM

## 2016-01-28 DIAGNOSIS — C773 Secondary and unspecified malignant neoplasm of axilla and upper limb lymph nodes: Secondary | ICD-10-CM

## 2016-01-28 DIAGNOSIS — Z5112 Encounter for antineoplastic immunotherapy: Secondary | ICD-10-CM | POA: Diagnosis not present

## 2016-01-28 DIAGNOSIS — C787 Secondary malignant neoplasm of liver and intrahepatic bile duct: Secondary | ICD-10-CM

## 2016-01-28 DIAGNOSIS — C438 Malignant melanoma of overlapping sites of skin: Secondary | ICD-10-CM

## 2016-01-28 LAB — CBC WITH DIFFERENTIAL/PLATELET
BASO%: 0.6 % (ref 0.0–2.0)
Basophils Absolute: 0.1 10*3/uL (ref 0.0–0.1)
EOS%: 0 % (ref 0.0–7.0)
Eosinophils Absolute: 0 10*3/uL (ref 0.0–0.5)
HCT: 34.1 % — ABNORMAL LOW (ref 34.8–46.6)
HGB: 11.2 g/dL — ABNORMAL LOW (ref 11.6–15.9)
LYMPH%: 9.7 % — AB (ref 14.0–49.7)
MCH: 26.7 pg (ref 25.1–34.0)
MCHC: 32.8 g/dL (ref 31.5–36.0)
MCV: 81.6 fL (ref 79.5–101.0)
MONO#: 0.6 10*3/uL (ref 0.1–0.9)
MONO%: 7.1 % (ref 0.0–14.0)
NEUT%: 82.6 % — AB (ref 38.4–76.8)
NEUTROS ABS: 7.5 10*3/uL — AB (ref 1.5–6.5)
PLATELETS: 317 10*3/uL (ref 145–400)
RBC: 4.17 10*6/uL (ref 3.70–5.45)
RDW: 13.7 % (ref 11.2–14.5)
WBC: 9.1 10*3/uL (ref 3.9–10.3)
lymph#: 0.9 10*3/uL (ref 0.9–3.3)

## 2016-01-28 LAB — COMPREHENSIVE METABOLIC PANEL
ALT: 12 U/L (ref 0–55)
ANION GAP: 10 meq/L (ref 3–11)
AST: 20 U/L (ref 5–34)
Albumin: 3.6 g/dL (ref 3.5–5.0)
Alkaline Phosphatase: 65 U/L (ref 40–150)
BILIRUBIN TOTAL: 0.32 mg/dL (ref 0.20–1.20)
BUN: 22.6 mg/dL (ref 7.0–26.0)
CHLORIDE: 106 meq/L (ref 98–109)
CO2: 23 meq/L (ref 22–29)
CREATININE: 0.8 mg/dL (ref 0.6–1.1)
Calcium: 10 mg/dL (ref 8.4–10.4)
EGFR: 69 mL/min/{1.73_m2} — ABNORMAL LOW (ref >90)
GLUCOSE: 110 mg/dL (ref 70–140)
Potassium: 4.2 mEq/L (ref 3.5–5.1)
SODIUM: 139 meq/L (ref 136–145)
TOTAL PROTEIN: 7.1 g/dL (ref 6.4–8.3)

## 2016-01-28 LAB — LACTATE DEHYDROGENASE: LDH: 182 U/L (ref 125–245)

## 2016-01-28 MED ORDER — SODIUM CHLORIDE 0.9 % IV SOLN
2.0000 mg/kg | Freq: Once | INTRAVENOUS | Status: AC
  Administered 2016-01-28: 100 mg via INTRAVENOUS
  Filled 2016-01-28: qty 4

## 2016-01-28 MED ORDER — SODIUM CHLORIDE 0.9 % IV SOLN
Freq: Once | INTRAVENOUS | Status: AC
  Administered 2016-01-28: 15:00:00 via INTRAVENOUS

## 2016-01-28 NOTE — Patient Instructions (Signed)
[facility] Discharge Instructions for Patients Receiving Chemotherapy  Today you received the following chemotherapy agents:  Keytruda.  To help prevent nausea and vomiting after your treatment, we encourage you to take your nausea medication as prescribed.   If you develop nausea and vomiting that is not controlled by your nausea medication, call the clinic.   BELOW ARE SYMPTOMS THAT SHOULD BE REPORTED IMMEDIATELY:  *FEVER GREATER THAN 100.5 F  *CHILLS WITH OR WITHOUT FEVER  NAUSEA AND VOMITING THAT IS NOT CONTROLLED WITH YOUR NAUSEA MEDICATION  *UNUSUAL SHORTNESS OF BREATH  *UNUSUAL BRUISING OR BLEEDING  TENDERNESS IN MOUTH AND THROAT WITH OR WITHOUT PRESENCE OF ULCERS  *URINARY PROBLEMS  *BOWEL PROBLEMS  UNUSUAL RASH Items with * indicate a potential emergency and should be followed up as soon as possible.  Feel free to call the clinic you have any questions or concerns. The clinic phone number is [phone].  Please show the CHEMO ALERT CARD at check-in to the Emergency Department and triage nurse.   

## 2016-02-18 ENCOUNTER — Ambulatory Visit (HOSPITAL_BASED_OUTPATIENT_CLINIC_OR_DEPARTMENT_OTHER): Payer: Medicare HMO

## 2016-02-18 ENCOUNTER — Other Ambulatory Visit (HOSPITAL_BASED_OUTPATIENT_CLINIC_OR_DEPARTMENT_OTHER): Payer: Medicare HMO

## 2016-02-18 VITALS — BP 129/61 | HR 85 | Temp 98.4°F | Resp 17

## 2016-02-18 DIAGNOSIS — C4359 Malignant melanoma of other part of trunk: Secondary | ICD-10-CM

## 2016-02-18 DIAGNOSIS — C439 Malignant melanoma of skin, unspecified: Secondary | ICD-10-CM

## 2016-02-18 DIAGNOSIS — C773 Secondary and unspecified malignant neoplasm of axilla and upper limb lymph nodes: Secondary | ICD-10-CM | POA: Diagnosis not present

## 2016-02-18 DIAGNOSIS — Z79899 Other long term (current) drug therapy: Secondary | ICD-10-CM | POA: Diagnosis not present

## 2016-02-18 DIAGNOSIS — C438 Malignant melanoma of overlapping sites of skin: Secondary | ICD-10-CM

## 2016-02-18 DIAGNOSIS — Z5112 Encounter for antineoplastic immunotherapy: Secondary | ICD-10-CM | POA: Diagnosis not present

## 2016-02-18 DIAGNOSIS — C50212 Malignant neoplasm of upper-inner quadrant of left female breast: Secondary | ICD-10-CM

## 2016-02-18 DIAGNOSIS — C787 Secondary malignant neoplasm of liver and intrahepatic bile duct: Secondary | ICD-10-CM

## 2016-02-18 LAB — CBC WITH DIFFERENTIAL/PLATELET
BASO%: 0.6 % (ref 0.0–2.0)
BASOS ABS: 0.1 10*3/uL (ref 0.0–0.1)
EOS%: 0 % (ref 0.0–7.0)
Eosinophils Absolute: 0 10*3/uL (ref 0.0–0.5)
HEMATOCRIT: 35.2 % (ref 34.8–46.6)
HEMOGLOBIN: 11.6 g/dL (ref 11.6–15.9)
LYMPH#: 0.8 10*3/uL — AB (ref 0.9–3.3)
LYMPH%: 8.2 % — ABNORMAL LOW (ref 14.0–49.7)
MCH: 26.9 pg (ref 25.1–34.0)
MCHC: 33 g/dL (ref 31.5–36.0)
MCV: 81.5 fL (ref 79.5–101.0)
MONO#: 0.6 10*3/uL (ref 0.1–0.9)
MONO%: 6.6 % (ref 0.0–14.0)
NEUT#: 8.1 10*3/uL — ABNORMAL HIGH (ref 1.5–6.5)
NEUT%: 84.6 % — ABNORMAL HIGH (ref 38.4–76.8)
Platelets: 347 10*3/uL (ref 145–400)
RBC: 4.32 10*6/uL (ref 3.70–5.45)
RDW: 13.5 % (ref 11.2–14.5)
WBC: 9.6 10*3/uL (ref 3.9–10.3)

## 2016-02-18 LAB — COMPREHENSIVE METABOLIC PANEL
ALBUMIN: 3.5 g/dL (ref 3.5–5.0)
ALK PHOS: 64 U/L (ref 40–150)
ALT: 15 U/L (ref 0–55)
AST: 19 U/L (ref 5–34)
Anion Gap: 12 mEq/L — ABNORMAL HIGH (ref 3–11)
BILIRUBIN TOTAL: 0.3 mg/dL (ref 0.20–1.20)
BUN: 23.1 mg/dL (ref 7.0–26.0)
CALCIUM: 10 mg/dL (ref 8.4–10.4)
CO2: 23 mEq/L (ref 22–29)
CREATININE: 0.8 mg/dL (ref 0.6–1.1)
Chloride: 104 mEq/L (ref 98–109)
EGFR: 67 mL/min/{1.73_m2} — ABNORMAL LOW (ref >90)
Glucose: 97 mg/dl (ref 70–140)
POTASSIUM: 3.9 meq/L (ref 3.5–5.1)
Sodium: 140 mEq/L (ref 136–145)
Total Protein: 7.3 g/dL (ref 6.4–8.3)

## 2016-02-18 LAB — LACTATE DEHYDROGENASE: LDH: 176 U/L (ref 125–245)

## 2016-02-18 LAB — TSH: TSH: 0.763 m[IU]/L (ref 0.308–3.960)

## 2016-02-18 MED ORDER — SODIUM CHLORIDE 0.9 % IV SOLN: Freq: Once | INTRAVENOUS | Status: DC

## 2016-02-18 MED ORDER — HEPARIN SOD (PORK) LOCK FLUSH 100 UNIT/ML IV SOLN
500.0000 [IU] | Freq: Once | INTRAVENOUS | Status: DC | PRN
  Filled 2016-02-18: qty 5

## 2016-02-18 MED ORDER — SODIUM CHLORIDE 0.9% FLUSH
10.0000 mL | INTRAVENOUS | Status: DC | PRN
  Filled 2016-02-18: qty 10

## 2016-02-18 MED ORDER — SODIUM CHLORIDE 0.9 % IV SOLN
2.0000 mg/kg | Freq: Once | INTRAVENOUS | Status: AC
  Administered 2016-02-18: 100 mg via INTRAVENOUS
  Filled 2016-02-18: qty 4

## 2016-02-18 NOTE — Patient Instructions (Signed)
[facility] Discharge Instructions for Patients Receiving Chemotherapy  Today you received the following chemotherapy agents: Keytruda   To help prevent nausea and vomiting after your treatment, we encourage you to take your nausea medication as directed    If you develop nausea and vomiting that is not controlled by your nausea medication, call the clinic.   BELOW ARE SYMPTOMS THAT SHOULD BE REPORTED IMMEDIATELY:  *FEVER GREATER THAN 100.5 F  *CHILLS WITH OR WITHOUT FEVER  NAUSEA AND VOMITING THAT IS NOT CONTROLLED WITH YOUR NAUSEA MEDICATION  *UNUSUAL SHORTNESS OF BREATH  *UNUSUAL BRUISING OR BLEEDING  TENDERNESS IN MOUTH AND THROAT WITH OR WITHOUT PRESENCE OF ULCERS  *URINARY PROBLEMS  *BOWEL PROBLEMS  UNUSUAL RASH Items with * indicate a potential emergency and should be followed up as soon as possible.  Feel free to call the clinic you have any questions or concerns. The clinic phone number is [phone].  Please show the CHEMO ALERT CARD at check-in to the Emergency Department and triage nurse.   

## 2016-03-10 ENCOUNTER — Ambulatory Visit (HOSPITAL_BASED_OUTPATIENT_CLINIC_OR_DEPARTMENT_OTHER): Payer: Medicare HMO

## 2016-03-10 ENCOUNTER — Ambulatory Visit (HOSPITAL_BASED_OUTPATIENT_CLINIC_OR_DEPARTMENT_OTHER): Payer: Medicare HMO | Admitting: Oncology

## 2016-03-10 ENCOUNTER — Other Ambulatory Visit (HOSPITAL_BASED_OUTPATIENT_CLINIC_OR_DEPARTMENT_OTHER): Payer: Medicare HMO

## 2016-03-10 VITALS — BP 161/69 | HR 76 | Temp 97.7°F | Resp 18 | Ht 64.0 in | Wt 117.5 lb

## 2016-03-10 DIAGNOSIS — C787 Secondary malignant neoplasm of liver and intrahepatic bile duct: Secondary | ICD-10-CM

## 2016-03-10 DIAGNOSIS — C4359 Malignant melanoma of other part of trunk: Secondary | ICD-10-CM

## 2016-03-10 DIAGNOSIS — C773 Secondary and unspecified malignant neoplasm of axilla and upper limb lymph nodes: Secondary | ICD-10-CM

## 2016-03-10 DIAGNOSIS — Z79811 Long term (current) use of aromatase inhibitors: Secondary | ICD-10-CM

## 2016-03-10 DIAGNOSIS — C438 Malignant melanoma of overlapping sites of skin: Secondary | ICD-10-CM

## 2016-03-10 DIAGNOSIS — Z170 Estrogen receptor positive status [ER+]: Secondary | ICD-10-CM | POA: Diagnosis not present

## 2016-03-10 DIAGNOSIS — C50212 Malignant neoplasm of upper-inner quadrant of left female breast: Secondary | ICD-10-CM

## 2016-03-10 DIAGNOSIS — C439 Malignant melanoma of skin, unspecified: Secondary | ICD-10-CM

## 2016-03-10 DIAGNOSIS — Z5112 Encounter for antineoplastic immunotherapy: Secondary | ICD-10-CM

## 2016-03-10 LAB — CBC WITH DIFFERENTIAL/PLATELET
BASO%: 0.5 % (ref 0.0–2.0)
BASOS ABS: 0 10*3/uL (ref 0.0–0.1)
EOS%: 0 % (ref 0.0–7.0)
Eosinophils Absolute: 0 10*3/uL (ref 0.0–0.5)
HEMATOCRIT: 36.9 % (ref 34.8–46.6)
HEMOGLOBIN: 12 g/dL (ref 11.6–15.9)
LYMPH#: 0.8 10*3/uL — AB (ref 0.9–3.3)
LYMPH%: 9.6 % — ABNORMAL LOW (ref 14.0–49.7)
MCH: 26.6 pg (ref 25.1–34.0)
MCHC: 32.6 g/dL (ref 31.5–36.0)
MCV: 81.7 fL (ref 79.5–101.0)
MONO#: 0.8 10*3/uL (ref 0.1–0.9)
MONO%: 9.3 % (ref 0.0–14.0)
NEUT#: 6.5 10*3/uL (ref 1.5–6.5)
NEUT%: 80.6 % — AB (ref 38.4–76.8)
PLATELETS: 285 10*3/uL (ref 145–400)
RBC: 4.52 10*6/uL (ref 3.70–5.45)
RDW: 14 % (ref 11.2–14.5)
WBC: 8 10*3/uL (ref 3.9–10.3)

## 2016-03-10 LAB — COMPREHENSIVE METABOLIC PANEL
ALBUMIN: 3.6 g/dL (ref 3.5–5.0)
ALK PHOS: 63 U/L (ref 40–150)
ALT: 9 U/L (ref 0–55)
AST: 18 U/L (ref 5–34)
Anion Gap: 12 mEq/L — ABNORMAL HIGH (ref 3–11)
BILIRUBIN TOTAL: 0.31 mg/dL (ref 0.20–1.20)
BUN: 19.7 mg/dL (ref 7.0–26.0)
CALCIUM: 9.9 mg/dL (ref 8.4–10.4)
CO2: 22 mEq/L (ref 22–29)
Chloride: 106 mEq/L (ref 98–109)
Creatinine: 0.8 mg/dL (ref 0.6–1.1)
EGFR: 67 mL/min/{1.73_m2} — AB (ref >90)
Glucose: 109 mg/dl (ref 70–140)
POTASSIUM: 4.3 meq/L (ref 3.5–5.1)
Sodium: 139 mEq/L (ref 136–145)
Total Protein: 7 g/dL (ref 6.4–8.3)

## 2016-03-10 LAB — LACTATE DEHYDROGENASE: LDH: 165 U/L (ref 125–245)

## 2016-03-10 MED ORDER — SODIUM CHLORIDE 0.9 % IV SOLN
Freq: Once | INTRAVENOUS | Status: AC
  Administered 2016-03-10: 10:00:00 via INTRAVENOUS

## 2016-03-10 MED ORDER — SODIUM CHLORIDE 0.9 % IV SOLN
2.0000 mg/kg | Freq: Once | INTRAVENOUS | Status: AC
  Administered 2016-03-10: 100 mg via INTRAVENOUS
  Filled 2016-03-10: qty 4

## 2016-03-10 NOTE — Patient Instructions (Signed)
[facility] Discharge Instructions for Patients Receiving Chemotherapy  Today you received the following chemotherapy agents: Keytruda   To help prevent nausea and vomiting after your treatment, we encourage you to take your nausea medication as directed    If you develop nausea and vomiting that is not controlled by your nausea medication, call the clinic.   BELOW ARE SYMPTOMS THAT SHOULD BE REPORTED IMMEDIATELY:  *FEVER GREATER THAN 100.5 F  *CHILLS WITH OR WITHOUT FEVER  NAUSEA AND VOMITING THAT IS NOT CONTROLLED WITH YOUR NAUSEA MEDICATION  *UNUSUAL SHORTNESS OF BREATH  *UNUSUAL BRUISING OR BLEEDING  TENDERNESS IN MOUTH AND THROAT WITH OR WITHOUT PRESENCE OF ULCERS  *URINARY PROBLEMS  *BOWEL PROBLEMS  UNUSUAL RASH Items with * indicate a potential emergency and should be followed up as soon as possible.  Feel free to call the clinic you have any questions or concerns. The clinic phone number is [phone].  Please show the CHEMO ALERT CARD at check-in to the Emergency Department and triage nurse.   

## 2016-03-10 NOTE — Progress Notes (Signed)
[name]  [phone] [phone]     ID: Danielle [name] DOB: 01-05-1926  MR#: [id number]  VQM#:[id number]  Patient Care Team: Danielle Bill, MD as PCP - General (Family Medicine) Danielle Gemma, MD as Consulting Physician (General Surgery) Danielle Cruel, MD as Consulting Physician (Oncology) Danielle Kenner, MD (Dermatology) Danielle Arabian, MD as Consulting Physician (Orthopedic Surgery) PCP: Danielle Bill, MD GYN: OTHER MD:  CHIEF COMPLAINT: Estrogen receptor positive breast cancer; stage IV malignant melanoma  CURRENT TREATMENT: Anastrozole, pembrolizumab   BREAST CANCER HISTORY: From the original intake note:  [facility]le [name] she had an itchy spot in the middle of her back which she scratched sometimes, but this was not evaluated until [facility]le [name] fell off a chair while changing a light bulb and broke her left upper extremity. Because she couldn't change while in a sling, her daughter Danielle [name] had 2 give her a hand and in the process noted a black lesion in the middle of Danielle [name]'s back. She took married to [facility]. [name] office where on 04/17/2015 he performed a shave biopsy of a pigmented lesion over the spine in the upper back. The pathology report ([id number]) showed a breast low depth of at least 6.5 mm, with the anatomic level at least 4. There were 23 mitoses per square millimeter and extensive ulceration. There was no satellitosis. There was no evidence of regression. They host response was not brisk. This was read as a pathology stage T4b and the patient was referred to [name] for wide excision.  However on exam [name] noted bilateral axillary adenopathy and also a mass in the upper inner quadrant of the left breast. He set [name] up for bilateral mammography with tomography 06/06/2015, and this showed an irregular hyperdense mass in the left breast upper inner quadrant which was palpable. There was also a firm palpable mass in the left axilla as well as in  the contralateral, right axilla. Ultrasonography showed the left breast mass to measure 2.7 cm and at least 2 abnormally enlarged lymph nodes in the left axilla the larger one measuring 3.9 cm. In the right axilla there were also 2 abnormal appearing lymph nodes, the larger measuring 2.6 cm.  On 06/06/2015 Danielle [name] underwent biopsy of the left breast mass and both axillary adenopathy areas. The left breast mass proved to be an invasive ductal carcinoma , grade 2, estrogen and progesterone receptor positive, both at 100%, with strong staining intensity. HER-2 was not amplified, with a signals ratio of 1.42, the average copy number per cell being 2.55. The proliferation marker was 5%.  More importantly, both axillary lymph nodes showed metastatic melanoma. We were contacted and prior to today's visit we obtained a PET scan, which shows in addition to the left breast mass and the bilateral axillary lymph nodes (described as just deep to the latissimus dorsi muscles bilaterally) 2 hypermetabolic foci in the left lobe of the liver, measuring 1.3 and 0.8 cm, with an SUV max of 6.3. A 1.5 cm mass next to the left [name], clearly separate from the adrenal gland, is also hypermetabolic with an SUV max of 8.2. There were no lung or bone lesions.  [facility]le [name] subsequent history is as detailed below   INTERVAL HISTORY: Danielle [name] returns today for follow-up of her breast cancer and metastatic malignant melanoma,, accompanied by her daughter Danielle [name]. Danielle [name] is due for pembrolizumab today. She receives it every 21 days. She developed hypothyroidism on this medication and her Synthroid was started and then titrated up so  that her most recent TSH was 0.763.  She also continues on anastrozole, with no side effects that she is aware of  REVIEW OF SYSTEMS: Danielle [name] is having pain in her right wrist. She also has pain in her left knee which she is status post fractures 2 with a bone transplant. She describes herself is moderately fatigued. She  tries to read 3 box a week and sometimes she makes that she says. She has a dry cough at times. She is not particularly short of breath except when she walks up stairs but gets to the top without stopping. Rarely she has nausea problems. She does have urinary frequency and his skin remains itchy. She has aches and pains here and there and can be unsteady but there have been no falls, no unusual headaches, no visual changes, and no vomiting. A detailed review of systems today was otherwise stable  PAST MEDICAL HISTORY: No past medical history on file. Remote history of rectal prolapse with chronic constipation resulting, history of left humeral fracture, osteopenia  PAST SURGICAL HISTORY: Past Surgical History:  Procedure Laterality Date  . HIP SURGERY      FAMILY HISTORY No family history on file. The patient's father died at the age of 62 in the setting of a call abuse. The patient's mother died from "old age" at 79. The patient had 4 brothers, no sisters. One brother died in his 54s from brain cancer. Another died from a stroke, and other from emphysema. The 69, youngest brother is 58 and jogs every day. There is no history of breast or ovarian cancer in the family. There is also no prior history of melanoma.  GYNECOLOGIC HISTORY:  No LMP recorded. Patient is postmenopausal. Menarche age 89, first live birth age 69, the patient is [name] P4. She stopped having periods in her early 25s. She did not use hormone replacement. She never took oral contraceptives.  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 She is widowed. She is now retired, and lives alone, with no pets. Her son Danielle [name] lives in [name], and is a retired Optometrist. Son Danielle [name] lives in [name] and is a Chief of Staff. Daughter Danielle [name] is a Marine scientist at [name] locally. The fourth child, Danielle [name], died in an automobile accident at age 17. The patient has 4  grandchildren, no great grandchildren. She is a Corporate treasurer. She attends services at [employer].    ADVANCED DIRECTIVES: Not yet notarized; Danielle [name] intends to name her daughter Danielle [name] as her healthcare power of attorney. She can be reached at [phone]   HEALTH MAINTENANCE: Social History  Substance Use Topics  . Smoking status: Never Smoker  . Smokeless tobacco: Not on file  . Alcohol use No     Colonoscopy: Remote  PAP:  Bone density: Remote  Lipid panel:  Allergies  Allergen Reactions  . Vicodin [Hydrocodone-Acetaminophen] Nausea Only    Current Outpatient Prescriptions  Medication Sig Dispense Refill  . anastrozole (ARIMIDEX) 1 MG tablet Take 1 tablet (1 mg total) by mouth daily. 90 tablet 4  . aspirin EC 81 MG tablet Take by mouth.    . enalapril (VASOTEC) 20 MG tablet Take 20 mg by mouth 2 (two) times daily.     Marland Kitchen levothyroxine (SYNTHROID) 100 MCG tablet Take 1 tablet (100 mcg total) by mouth daily before breakfast. 30 tablet 6  . Lutein 20 MG CAPS Take by mouth.    Marland Kitchen  Red Yeast Rice Extract (RED YEAST RICE PO) Take by mouth.    . triamterene-hydrochlorothiazide (DYAZIDE) 37.5-25 MG capsule Take 1 capsule by mouth daily.     . verapamil (CALAN-SR) 240 MG CR tablet   0   No current facility-administered medications for this visit.     OBJECTIVE: Older white woman Who appears stated age.  Vitals:   03/10/16 0914  BP: (!) 161/69  Pulse: 76  Resp: 18  Temp: 97.7 F (36.5 C)     Body mass index is 20.17 kg/m.    ECOG FS:1 - Symptomatic but completely ambulatory  Sclerae unicteric, EOMs intact Oropharynx clear and moist No cervical or supraclavicular adenopathy Lungs no rales or rhonchi Heart regular rate and rhythm Abd soft, nontender, positive bowel sounds MSK no focal spinal tenderness, no upper extremity lymphedema Neuro: nonfocal, well oriented, appropriate affect Breasts: Deferred    LAB RESULTS:  CMP     Component Value Date/Time   NA  139 03/10/2016 0855   K 4.3 03/10/2016 0855   CL 100 07/30/2006 1318   CO2 22 03/10/2016 0855   GLUCOSE 109 03/10/2016 0855   BUN 19.7 03/10/2016 0855   CREATININE 0.8 03/10/2016 0855   CALCIUM 9.9 03/10/2016 0855   PROT 7.0 03/10/2016 0855   ALBUMIN 3.6 03/10/2016 0855   AST 18 03/10/2016 0855   ALT 9 03/10/2016 0855   ALKPHOS 63 03/10/2016 0855   BILITOT 0.31 03/10/2016 0855   GFRNONAA 86 07/30/2006 1318   GFRAA 104 07/30/2006 1318    INo results found for: SPEP, UPEP  Lab Results  Component Value Date   WBC 8.0 03/10/2016   NEUTROABS 6.5 03/10/2016   HGB 12.0 03/10/2016   HCT 36.9 03/10/2016   MCV 81.7 03/10/2016   PLT 285 03/10/2016      Chemistry      Component Value Date/Time   NA 139 03/10/2016 0855   K 4.3 03/10/2016 0855   CL 100 07/30/2006 1318   CO2 22 03/10/2016 0855   BUN 19.7 03/10/2016 0855   CREATININE 0.8 03/10/2016 0855      Component Value Date/Time   CALCIUM 9.9 03/10/2016 0855   ALKPHOS 63 03/10/2016 0855   AST 18 03/10/2016 0855   ALT 9 03/10/2016 0855   BILITOT 0.31 03/10/2016 0855       No results found for: LABCA2  No components found for: LABCA125  No results for input(s): INR in the last 168 hours.  Urinalysis No results found for: COLORURINE, APPEARANCEUR, LABSPEC, PHURINE, GLUCOSEU, HGBUR, BILIRUBINUR, KETONESUR, PROTEINUR, UROBILINOGEN, NITRITE, LEUKOCYTESUR  STUDIES: No results found.  ASSESSMENT: 82 y.o. [place] woman with concurrent breast cancer and melanoma  BREAST CANCER: (1) left breast upper inner quadrant biopsy 06/06/2015 shows a clinical T2 NX, stage II invasive ductal carcinoma , grade 2, estrogen receptor and progesterone receptor strongly positive, with an MIB-1 of 5%, and no HER-2 amplification, the signals ratio being 1.42 and the number per cell 2.55.   (2) anastrozole started 07/01/2015  (a) bone density February 2017 shows osteoporosis with a T score of -2.5  MALIGNANT MELANOMA: (a) shave  biopsy from the mid upper back 04/17/2015 shows invasive malignant melanoma, Breslow depth 6.5 mm plus, Clark's level 4+, with 23 mitoses per square millimeter, extensive ulceration,, with involved edges, and non-brisk host response  (i) biopsy of right and left axillary lymph nodes 06/06/2015 showed metastatic melanoma, BRAF wild type  (ii) PET scan 07/01/2015 shows bilateral axillary adenopathy, a left latissimus mass, a  left pararenal mass, and 2 liver lesions  (b) pembrolizumab started 07/16/2015, repeated every 21 days  (i) increased TSH noted June 2018, Corrected on Synthroid   PLAN: Noel is proceeding to treatment with pembrolizumab today. Now that we have control the thyroid dysfunction problem, she essentially has no side effects from this medication other than perhaps mild fatigue. It is of course difficult to tell what her normal functional status would be given her age but certainly her current status is within normal range.  We did have a discussion today whether or not to continue this treatment. She is willing to continue at least through December at which point we will have a repeat PET scan. At that time she will decide whether she wants to continue it another 6 months or a year, or discontinued and started observation.  She knows to call for any problems that may develop before her return visit here   Danielle Cruel, MD   03/10/2016 7:59 PM

## 2016-03-31 ENCOUNTER — Other Ambulatory Visit: Payer: Medicare HMO

## 2016-03-31 ENCOUNTER — Ambulatory Visit: Payer: Medicare HMO

## 2016-04-08 ENCOUNTER — Other Ambulatory Visit (HOSPITAL_BASED_OUTPATIENT_CLINIC_OR_DEPARTMENT_OTHER): Payer: Medicare HMO

## 2016-04-08 ENCOUNTER — Ambulatory Visit (HOSPITAL_BASED_OUTPATIENT_CLINIC_OR_DEPARTMENT_OTHER): Payer: Medicare HMO

## 2016-04-08 VITALS — BP 128/54 | Temp 98.2°F | Resp 16

## 2016-04-08 DIAGNOSIS — C773 Secondary and unspecified malignant neoplasm of axilla and upper limb lymph nodes: Secondary | ICD-10-CM

## 2016-04-08 DIAGNOSIS — C438 Malignant melanoma of overlapping sites of skin: Secondary | ICD-10-CM

## 2016-04-08 DIAGNOSIS — Z79899 Other long term (current) drug therapy: Secondary | ICD-10-CM | POA: Diagnosis not present

## 2016-04-08 DIAGNOSIS — C50212 Malignant neoplasm of upper-inner quadrant of left female breast: Secondary | ICD-10-CM

## 2016-04-08 DIAGNOSIS — C4359 Malignant melanoma of other part of trunk: Secondary | ICD-10-CM | POA: Diagnosis not present

## 2016-04-08 DIAGNOSIS — C787 Secondary malignant neoplasm of liver and intrahepatic bile duct: Secondary | ICD-10-CM

## 2016-04-08 DIAGNOSIS — Z5112 Encounter for antineoplastic immunotherapy: Secondary | ICD-10-CM | POA: Diagnosis not present

## 2016-04-08 DIAGNOSIS — C439 Malignant melanoma of skin, unspecified: Secondary | ICD-10-CM

## 2016-04-08 LAB — CBC WITH DIFFERENTIAL/PLATELET
BASO%: 0.4 % (ref 0.0–2.0)
BASOS ABS: 0 10*3/uL (ref 0.0–0.1)
EOS%: 0 % (ref 0.0–7.0)
Eosinophils Absolute: 0 10*3/uL (ref 0.0–0.5)
HEMATOCRIT: 37.3 % (ref 34.8–46.6)
HEMOGLOBIN: 12.2 g/dL (ref 11.6–15.9)
LYMPH#: 0.7 10*3/uL — AB (ref 0.9–3.3)
LYMPH%: 6.5 % — ABNORMAL LOW (ref 14.0–49.7)
MCH: 26.6 pg (ref 25.1–34.0)
MCHC: 32.7 g/dL (ref 31.5–36.0)
MCV: 81.4 fL (ref 79.5–101.0)
MONO#: 0.8 10*3/uL (ref 0.1–0.9)
MONO%: 7.5 % (ref 0.0–14.0)
NEUT#: 8.9 10*3/uL — ABNORMAL HIGH (ref 1.5–6.5)
NEUT%: 85.6 % — ABNORMAL HIGH (ref 38.4–76.8)
Platelets: 309 10*3/uL (ref 145–400)
RBC: 4.59 10*6/uL (ref 3.70–5.45)
RDW: 14.5 % (ref 11.2–14.5)
WBC: 10.4 10*3/uL — ABNORMAL HIGH (ref 3.9–10.3)

## 2016-04-08 LAB — COMPREHENSIVE METABOLIC PANEL
ALBUMIN: 3.6 g/dL (ref 3.5–5.0)
ALK PHOS: 60 U/L (ref 40–150)
ALT: 13 U/L (ref 0–55)
AST: 18 U/L (ref 5–34)
Anion Gap: 13 mEq/L — ABNORMAL HIGH (ref 3–11)
BUN: 26.4 mg/dL — AB (ref 7.0–26.0)
CALCIUM: 10.4 mg/dL (ref 8.4–10.4)
CO2: 22 mEq/L (ref 22–29)
CREATININE: 0.9 mg/dL (ref 0.6–1.1)
Chloride: 105 mEq/L (ref 98–109)
EGFR: 58 mL/min/{1.73_m2} — ABNORMAL LOW (ref >90)
Glucose: 129 mg/dl (ref 70–140)
Potassium: 4 mEq/L (ref 3.5–5.1)
Sodium: 140 mEq/L (ref 136–145)
Total Bilirubin: 0.24 mg/dL (ref 0.20–1.20)
Total Protein: 7.3 g/dL (ref 6.4–8.3)

## 2016-04-08 LAB — TSH: TSH: 1.094 m[IU]/L (ref 0.308–3.960)

## 2016-04-08 LAB — LACTATE DEHYDROGENASE: LDH: 169 U/L (ref 125–245)

## 2016-04-08 MED ORDER — SODIUM CHLORIDE 0.9 % IV SOLN
2.0000 mg/kg | Freq: Once | INTRAVENOUS | Status: AC
  Administered 2016-04-08: 100 mg via INTRAVENOUS
  Filled 2016-04-08: qty 4

## 2016-04-08 MED ORDER — SODIUM CHLORIDE 0.9 % IV SOLN
Freq: Once | INTRAVENOUS | Status: AC
  Administered 2016-04-08: 10:00:00 via INTRAVENOUS

## 2016-04-08 NOTE — Patient Instructions (Signed)
[facility] Discharge Instructions for Patients Receiving Chemotherapy  Today you received the following chemotherapy agents: Keytruda   To help prevent nausea and vomiting after your treatment, we encourage you to take your nausea medication as directed    If you develop nausea and vomiting that is not controlled by your nausea medication, call the clinic.   BELOW ARE SYMPTOMS THAT SHOULD BE REPORTED IMMEDIATELY:  *FEVER GREATER THAN 100.5 F  *CHILLS WITH OR WITHOUT FEVER  NAUSEA AND VOMITING THAT IS NOT CONTROLLED WITH YOUR NAUSEA MEDICATION  *UNUSUAL SHORTNESS OF BREATH  *UNUSUAL BRUISING OR BLEEDING  TENDERNESS IN MOUTH AND THROAT WITH OR WITHOUT PRESENCE OF ULCERS  *URINARY PROBLEMS  *BOWEL PROBLEMS  UNUSUAL RASH Items with * indicate a potential emergency and should be followed up as soon as possible.  Feel free to call the clinic you have any questions or concerns. The clinic phone number is [phone].  Please show the CHEMO ALERT CARD at check-in to the Emergency Department and triage nurse.   

## 2016-04-21 ENCOUNTER — Ambulatory Visit: Payer: Medicare HMO

## 2016-04-21 ENCOUNTER — Other Ambulatory Visit: Payer: Medicare HMO

## 2016-04-29 ENCOUNTER — Other Ambulatory Visit (HOSPITAL_BASED_OUTPATIENT_CLINIC_OR_DEPARTMENT_OTHER): Payer: Medicare HMO

## 2016-04-29 ENCOUNTER — Ambulatory Visit (HOSPITAL_BASED_OUTPATIENT_CLINIC_OR_DEPARTMENT_OTHER): Payer: Medicare HMO

## 2016-04-29 VITALS — BP 155/57 | HR 70 | Temp 97.8°F | Resp 18

## 2016-04-29 DIAGNOSIS — C4359 Malignant melanoma of other part of trunk: Secondary | ICD-10-CM | POA: Diagnosis not present

## 2016-04-29 DIAGNOSIS — C787 Secondary malignant neoplasm of liver and intrahepatic bile duct: Secondary | ICD-10-CM

## 2016-04-29 DIAGNOSIS — Z5112 Encounter for antineoplastic immunotherapy: Secondary | ICD-10-CM

## 2016-04-29 DIAGNOSIS — C439 Malignant melanoma of skin, unspecified: Secondary | ICD-10-CM

## 2016-04-29 DIAGNOSIS — C773 Secondary and unspecified malignant neoplasm of axilla and upper limb lymph nodes: Secondary | ICD-10-CM

## 2016-04-29 DIAGNOSIS — C438 Malignant melanoma of overlapping sites of skin: Secondary | ICD-10-CM

## 2016-04-29 DIAGNOSIS — C50212 Malignant neoplasm of upper-inner quadrant of left female breast: Secondary | ICD-10-CM

## 2016-04-29 LAB — COMPREHENSIVE METABOLIC PANEL
ALBUMIN: 3.4 g/dL — AB (ref 3.5–5.0)
ALK PHOS: 57 U/L (ref 40–150)
ALT: 13 U/L (ref 0–55)
ANION GAP: 10 meq/L (ref 3–11)
AST: 21 U/L (ref 5–34)
BILIRUBIN TOTAL: 0.25 mg/dL (ref 0.20–1.20)
BUN: 25.4 mg/dL (ref 7.0–26.0)
CO2: 24 mEq/L (ref 22–29)
Calcium: 9.7 mg/dL (ref 8.4–10.4)
Chloride: 106 mEq/L (ref 98–109)
Creatinine: 0.8 mg/dL (ref 0.6–1.1)
EGFR: 71 mL/min/{1.73_m2} — AB (ref >90)
GLUCOSE: 106 mg/dL (ref 70–140)
Potassium: 4.1 mEq/L (ref 3.5–5.1)
SODIUM: 140 meq/L (ref 136–145)
TOTAL PROTEIN: 6.9 g/dL (ref 6.4–8.3)

## 2016-04-29 LAB — CBC WITH DIFFERENTIAL/PLATELET
BASO%: 0.8 % (ref 0.0–2.0)
Basophils Absolute: 0.1 10*3/uL (ref 0.0–0.1)
EOS%: 0 % (ref 0.0–7.0)
Eosinophils Absolute: 0 10*3/uL (ref 0.0–0.5)
HEMATOCRIT: 35.6 % (ref 34.8–46.6)
HGB: 11.7 g/dL (ref 11.6–15.9)
LYMPH#: 0.8 10*3/uL — AB (ref 0.9–3.3)
LYMPH%: 10 % — ABNORMAL LOW (ref 14.0–49.7)
MCH: 26.5 pg (ref 25.1–34.0)
MCHC: 32.8 g/dL (ref 31.5–36.0)
MCV: 80.8 fL (ref 79.5–101.0)
MONO#: 0.7 10*3/uL (ref 0.1–0.9)
MONO%: 8.8 % (ref 0.0–14.0)
NEUT#: 6.5 10*3/uL (ref 1.5–6.5)
NEUT%: 80.4 % — AB (ref 38.4–76.8)
Platelets: 302 10*3/uL (ref 145–400)
RBC: 4.41 10*6/uL (ref 3.70–5.45)
RDW: 14.5 % (ref 11.2–14.5)
WBC: 8 10*3/uL (ref 3.9–10.3)

## 2016-04-29 LAB — LACTATE DEHYDROGENASE: LDH: 170 U/L (ref 125–245)

## 2016-04-29 MED ORDER — SODIUM CHLORIDE 0.9 % IV SOLN
Freq: Once | INTRAVENOUS | Status: AC
  Administered 2016-04-29: 10:00:00 via INTRAVENOUS

## 2016-04-29 MED ORDER — SODIUM CHLORIDE 0.9 % IV SOLN
2.0000 mg/kg | Freq: Once | INTRAVENOUS | Status: AC
  Administered 2016-04-29: 100 mg via INTRAVENOUS
  Filled 2016-04-29: qty 4

## 2016-04-29 NOTE — Patient Instructions (Signed)
[facility] Discharge Instructions for Patients Receiving Chemotherapy  Today you received the following chemotherapy agents: Keytruda   To help prevent nausea and vomiting after your treatment, we encourage you to take your nausea medication as directed    If you develop nausea and vomiting that is not controlled by your nausea medication, call the clinic.   BELOW ARE SYMPTOMS THAT SHOULD BE REPORTED IMMEDIATELY:  *FEVER GREATER THAN 100.5 F  *CHILLS WITH OR WITHOUT FEVER  NAUSEA AND VOMITING THAT IS NOT CONTROLLED WITH YOUR NAUSEA MEDICATION  *UNUSUAL SHORTNESS OF BREATH  *UNUSUAL BRUISING OR BLEEDING  TENDERNESS IN MOUTH AND THROAT WITH OR WITHOUT PRESENCE OF ULCERS  *URINARY PROBLEMS  *BOWEL PROBLEMS  UNUSUAL RASH Items with * indicate a potential emergency and should be followed up as soon as possible.  Feel free to call the clinic you have any questions or concerns. The clinic phone number is [phone].  Please show the CHEMO ALERT CARD at check-in to the Emergency Department and triage nurse.   

## 2016-05-06 ENCOUNTER — Other Ambulatory Visit: Payer: Self-pay | Admitting: Oncology

## 2016-05-06 ENCOUNTER — Other Ambulatory Visit: Payer: Self-pay | Admitting: *Deleted

## 2016-05-06 DIAGNOSIS — C438 Malignant melanoma of overlapping sites of skin: Secondary | ICD-10-CM

## 2016-05-06 DIAGNOSIS — Z170 Estrogen receptor positive status [ER+]: Secondary | ICD-10-CM

## 2016-05-06 DIAGNOSIS — C50212 Malignant neoplasm of upper-inner quadrant of left female breast: Secondary | ICD-10-CM

## 2016-05-06 DIAGNOSIS — C787 Secondary malignant neoplasm of liver and intrahepatic bile duct: Secondary | ICD-10-CM

## 2016-05-07 ENCOUNTER — Encounter (HOSPITAL_COMMUNITY): Payer: Medicare HMO

## 2016-05-08 ENCOUNTER — Encounter (HOSPITAL_COMMUNITY): Payer: Medicare HMO

## 2016-05-08 ENCOUNTER — Encounter (HOSPITAL_COMMUNITY)
Admission: RE | Admit: 2016-05-08 | Discharge: 2016-05-08 | Disposition: A | Discharge status: Home / Self Care | Payer: Medicare HMO | Source: Ambulatory Visit | Attending: Oncology | Admitting: Oncology

## 2016-05-08 DIAGNOSIS — Z85820 Personal history of malignant melanoma of skin: Secondary | ICD-10-CM | POA: Insufficient documentation

## 2016-05-08 DIAGNOSIS — M899 Disorder of bone, unspecified: Secondary | ICD-10-CM | POA: Diagnosis not present

## 2016-05-08 DIAGNOSIS — K769 Liver disease, unspecified: Secondary | ICD-10-CM | POA: Diagnosis not present

## 2016-05-08 DIAGNOSIS — C787 Secondary malignant neoplasm of liver and intrahepatic bile duct: Secondary | ICD-10-CM | POA: Insufficient documentation

## 2016-05-08 DIAGNOSIS — M170 Bilateral primary osteoarthritis of knee: Secondary | ICD-10-CM | POA: Insufficient documentation

## 2016-05-08 DIAGNOSIS — C50212 Malignant neoplasm of upper-inner quadrant of left female breast: Secondary | ICD-10-CM | POA: Diagnosis not present

## 2016-05-08 DIAGNOSIS — Z170 Estrogen receptor positive status [ER+]: Secondary | ICD-10-CM | POA: Insufficient documentation

## 2016-05-08 DIAGNOSIS — N281 Cyst of kidney, acquired: Secondary | ICD-10-CM | POA: Insufficient documentation

## 2016-05-08 DIAGNOSIS — S42301S Unspecified fracture of shaft of humerus, right arm, sequela: Secondary | ICD-10-CM | POA: Insufficient documentation

## 2016-05-08 DIAGNOSIS — M629 Disorder of muscle, unspecified: Secondary | ICD-10-CM | POA: Insufficient documentation

## 2016-05-08 DIAGNOSIS — K5730 Diverticulosis of large intestine without perforation or abscess without bleeding: Secondary | ICD-10-CM | POA: Diagnosis not present

## 2016-05-08 LAB — GLUCOSE, CAPILLARY: Glucose-Capillary: 99 mg/dL (ref 65–99)

## 2016-05-08 MED ORDER — FLUDEOXYGLUCOSE F - 18 (FDG) INJECTION
5.8000 | Freq: Once | INTRAVENOUS | Status: AC | PRN
  Administered 2016-05-08: 5.8 via INTRAVENOUS

## 2016-05-09 ENCOUNTER — Encounter: Payer: Self-pay | Admitting: Oncology

## 2016-05-12 ENCOUNTER — Ambulatory Visit: Payer: Medicare HMO

## 2016-05-12 ENCOUNTER — Ambulatory Visit: Payer: Medicare HMO | Admitting: Oncology

## 2016-05-12 ENCOUNTER — Other Ambulatory Visit: Payer: Medicare HMO

## 2016-05-20 ENCOUNTER — Other Ambulatory Visit (HOSPITAL_BASED_OUTPATIENT_CLINIC_OR_DEPARTMENT_OTHER): Payer: Medicare HMO

## 2016-05-20 ENCOUNTER — Ambulatory Visit (HOSPITAL_BASED_OUTPATIENT_CLINIC_OR_DEPARTMENT_OTHER): Payer: Medicare HMO | Admitting: Oncology

## 2016-05-20 ENCOUNTER — Telehealth: Payer: Self-pay | Admitting: Oncology

## 2016-05-20 ENCOUNTER — Ambulatory Visit (HOSPITAL_BASED_OUTPATIENT_CLINIC_OR_DEPARTMENT_OTHER): Payer: Medicare HMO

## 2016-05-20 VITALS — BP 130/48 | HR 62 | Temp 97.4°F | Resp 18 | Ht 64.0 in | Wt 117.6 lb

## 2016-05-20 DIAGNOSIS — Z5112 Encounter for antineoplastic immunotherapy: Secondary | ICD-10-CM

## 2016-05-20 DIAGNOSIS — C4359 Malignant melanoma of other part of trunk: Secondary | ICD-10-CM | POA: Diagnosis not present

## 2016-05-20 DIAGNOSIS — C773 Secondary and unspecified malignant neoplasm of axilla and upper limb lymph nodes: Secondary | ICD-10-CM | POA: Diagnosis not present

## 2016-05-20 DIAGNOSIS — C438 Malignant melanoma of overlapping sites of skin: Secondary | ICD-10-CM

## 2016-05-20 DIAGNOSIS — C50212 Malignant neoplasm of upper-inner quadrant of left female breast: Secondary | ICD-10-CM

## 2016-05-20 DIAGNOSIS — Z79899 Other long term (current) drug therapy: Secondary | ICD-10-CM | POA: Diagnosis not present

## 2016-05-20 DIAGNOSIS — Z170 Estrogen receptor positive status [ER+]: Secondary | ICD-10-CM

## 2016-05-20 DIAGNOSIS — Z79811 Long term (current) use of aromatase inhibitors: Secondary | ICD-10-CM

## 2016-05-20 DIAGNOSIS — C787 Secondary malignant neoplasm of liver and intrahepatic bile duct: Secondary | ICD-10-CM

## 2016-05-20 DIAGNOSIS — C439 Malignant melanoma of skin, unspecified: Secondary | ICD-10-CM

## 2016-05-20 DIAGNOSIS — M810 Age-related osteoporosis without current pathological fracture: Secondary | ICD-10-CM

## 2016-05-20 LAB — COMPREHENSIVE METABOLIC PANEL
ALBUMIN: 3.6 g/dL (ref 3.5–5.0)
ALK PHOS: 60 U/L (ref 40–150)
ALT: 12 U/L (ref 0–55)
ANION GAP: 9 meq/L (ref 3–11)
AST: 20 U/L (ref 5–34)
BILIRUBIN TOTAL: 0.27 mg/dL (ref 0.20–1.20)
BUN: 23.8 mg/dL (ref 7.0–26.0)
CALCIUM: 9.8 mg/dL (ref 8.4–10.4)
CO2: 24 mEq/L (ref 22–29)
Chloride: 105 mEq/L (ref 98–109)
Creatinine: 0.8 mg/dL (ref 0.6–1.1)
EGFR: 64 mL/min/{1.73_m2} — AB (ref >90)
GLUCOSE: 102 mg/dL (ref 70–140)
Potassium: 4.5 mEq/L (ref 3.5–5.1)
Sodium: 139 mEq/L (ref 136–145)
TOTAL PROTEIN: 6.8 g/dL (ref 6.4–8.3)

## 2016-05-20 LAB — CBC WITH DIFFERENTIAL/PLATELET
BASO%: 0.4 % (ref 0.0–2.0)
Basophils Absolute: 0 10*3/uL (ref 0.0–0.1)
EOS%: 0.1 % (ref 0.0–7.0)
Eosinophils Absolute: 0 10*3/uL (ref 0.0–0.5)
HEMATOCRIT: 34.9 % (ref 34.8–46.6)
HGB: 11.5 g/dL — ABNORMAL LOW (ref 11.6–15.9)
LYMPH#: 1 10*3/uL (ref 0.9–3.3)
LYMPH%: 13.8 % — ABNORMAL LOW (ref 14.0–49.7)
MCH: 26.9 pg (ref 25.1–34.0)
MCHC: 33 g/dL (ref 31.5–36.0)
MCV: 81.7 fL (ref 79.5–101.0)
MONO#: 0.9 10*3/uL (ref 0.1–0.9)
MONO%: 11.9 % (ref 0.0–14.0)
NEUT#: 5.3 10*3/uL (ref 1.5–6.5)
NEUT%: 73.8 % (ref 38.4–76.8)
Platelets: 277 10*3/uL (ref 145–400)
RBC: 4.27 10*6/uL (ref 3.70–5.45)
RDW: 14.6 % — ABNORMAL HIGH (ref 11.2–14.5)
WBC: 7.2 10*3/uL (ref 3.9–10.3)

## 2016-05-20 LAB — TSH: TSH: 4.143 m[IU]/L — AB (ref 0.308–3.960)

## 2016-05-20 LAB — LACTATE DEHYDROGENASE: LDH: 185 U/L (ref 125–245)

## 2016-05-20 MED ORDER — SODIUM CHLORIDE 0.9 % IV SOLN
Freq: Once | INTRAVENOUS | Status: AC
  Administered 2016-05-20: 16:00:00 via INTRAVENOUS

## 2016-05-20 MED ORDER — SODIUM CHLORIDE 0.9 % IV SOLN
2.0000 mg/kg | Freq: Once | INTRAVENOUS | Status: AC
  Administered 2016-05-20: 100 mg via INTRAVENOUS
  Filled 2016-05-20: qty 4

## 2016-05-20 NOTE — Patient Instructions (Signed)
[facility] Discharge Instructions for Patients Receiving Chemotherapy  Today you received the following chemotherapy agents: Keytruda   To help prevent nausea and vomiting after your treatment, we encourage you to take your nausea medication as directed    If you develop nausea and vomiting that is not controlled by your nausea medication, call the clinic.   BELOW ARE SYMPTOMS THAT SHOULD BE REPORTED IMMEDIATELY:  *FEVER GREATER THAN 100.5 F  *CHILLS WITH OR WITHOUT FEVER  NAUSEA AND VOMITING THAT IS NOT CONTROLLED WITH YOUR NAUSEA MEDICATION  *UNUSUAL SHORTNESS OF BREATH  *UNUSUAL BRUISING OR BLEEDING  TENDERNESS IN MOUTH AND THROAT WITH OR WITHOUT PRESENCE OF ULCERS  *URINARY PROBLEMS  *BOWEL PROBLEMS  UNUSUAL RASH Items with * indicate a potential emergency and should be followed up as soon as possible.  Feel free to call the clinic you have any questions or concerns. The clinic phone number is [phone].  Please show the CHEMO ALERT CARD at check-in to the Emergency Department and triage nurse.   

## 2016-05-20 NOTE — Progress Notes (Signed)
[facility]  [phone] [phone]     ID: Shaneal Barasch DOB: [birth year]  MR#: [id number]  VOZ#:[id number]  Patient Care Team: Bartholome Bill, MD as PCP - General (Family Medicine) Armandina Gemma, MD as Consulting Physician (General Surgery) Chauncey Cruel, MD as Consulting Physician (Oncology) Allyn Kenner, MD (Dermatology) Gaynelle Arabian, MD as Consulting Physician (Orthopedic Surgery) PCP: Bartholome Bill, MD GYN: OTHER MD:  CHIEF COMPLAINT: Estrogen receptor positive breast cancer; stage IV malignant melanoma  CURRENT TREATMENT: Anastrozole, pembrolizumab   BREAST CANCER HISTORY: From the original intake note:  Danetta she had an itchy spot in the middle of her back which she scratched sometimes, but this was not evaluated until [facility] fell off a chair while changing a light bulb and broke her left upper extremity. Because she couldn't change while in a sling, her daughter [name] had 2 give her a hand and in the process noted a black lesion in the middle of Johneisha's back. She took married to Dr. [name] office where on 04/17/2015 he performed a shave biopsy of a pigmented lesion over the spine in the upper back. The pathology report ([id number]) showed a breast low depth of at least 6.5 mm, with the anatomic level at least 4. There were 23 mitoses per square millimeter and extensive ulceration. There was no satellitosis. There was no evidence of regression. They host response was not brisk. This was read as a pathology stage T4b and the patient was referred to [name] for wide excision.  However on exam Dr. jerking noted bilateral axillary adenopathy and also a mass in the upper inner quadrant of the left breast. He set [name] up for bilateral mammography with tomography 06/06/2015, and this showed an irregular hyperdense mass in the left breast upper inner quadrant which was palpable. There was also a firm palpable mass in the left axilla as well as in  the contralateral, right axilla. Ultrasonography showed the left breast mass to measure 2.7 cm and at least 2 abnormally enlarged lymph nodes in the left axilla the larger one measuring 3.9 cm. In the right axilla there were also 2 abnormal appearing lymph nodes, the larger measuring 2.6 cm.  On 06/06/2015 Choua underwent biopsy of the left breast mass and both axillary adenopathy areas. The left breast mass proved to be an invasive ductal carcinoma , grade 2, estrogen and progesterone receptor positive, both at 100%, with strong staining intensity. HER-2 was not amplified, with a signals ratio of 1.42, the average copy number per cell being 2.55. The proliferation marker was 5%.  More importantly, both axillary lymph nodes showed metastatic melanoma. We were contacted and prior to today's visit we obtained a PET scan, which shows in addition to the left breast mass and the bilateral axillary lymph nodes (described as just deep to the latissimus dorsi muscles bilaterally) 2 hypermetabolic foci in the left lobe of the liver, measuring 1.3 and 0.8 cm, with an SUV max of 6.3. A 1.5 cm mass next to the left kidney, clearly separate from the adrenal gland, is also hypermetabolic with an SUV max of 8.2. There were no lung or bone lesions.  Ameriah subsequent history is as detailed below   INTERVAL HISTORY: Fatoumata returns today for follow-up of herestrogen receptor positive breast cancer and metastatic malignant melanoma,, accompanied by her daughter [name]. Neliah  Continues on anastrozole, with good tolerance. Hot flashes and vaginal dryness are not a major issue. She never developed the arthralgias or myalgias that  many patients can experience on this medication. She obtains it at a good price.  She is also on pembrolizumab. She has no fatigue, nausea, rash, change in bowel habits, cough, or other side effects that she is aware of from this medication.  We just obtained a restaging PET scan which shows continuing  control and some improvement in the metastatic melanoma. There seems to be a little bit increased activity in the left breast cancer  REVIEW OF SYSTEMS: Taelar Gronewold up driving in October and I think that is probably a good move even though she is so viable. She describes herself is mild to moderately fatigued. She has knee and joint pain which is very intermittent. She can be short of breath particularly when walking upstairs. She has nocturia. She thinks her skin is very dry. Sometimes when she stands up too quickly she feels a little dizzy. Generally though she feels she is "fine". A detailed review of systems today was otherwise stable  PAST MEDICAL HISTORY: No past medical history on file. Remote history of rectal prolapse with chronic constipation resulting, history of left humeral fracture, osteopenia  PAST SURGICAL HISTORY: Past Surgical History:  Procedure Laterality Date  . HIP SURGERY      FAMILY HISTORY No family history on file. The patient's father died at the age of 66 in the setting of a call abuse. The patient's mother died from "old age" at 60. The patient had 4 brothers, no sisters. One brother died in his 34s from brain cancer. Another died from a stroke, and other from emphysema. The 40, youngest brother is 43 and jogs every day. There is no history of breast or ovarian cancer in the family. There is also no prior history of melanoma.  GYNECOLOGIC HISTORY:  No LMP recorded. Patient is postmenopausal. Menarche age 33, first live birth age 69, the patient is [place] P4. She stopped having periods in her early 75s. She did not use hormone replacement. She never took oral contraceptives.  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 She is widowed. She is now retired, and lives alone, with no pets. Her son [name] lives in [place], and is a retired Optometrist. Son [name] lives in [place] and is a Armed forces logistics/support/administrative officer. Daughter [name] is a Marine scientist at [employer] locally. The fourth child, [name], died in an automobile accident at age 44. The patient has 4 grandchildren, no great grandchildren. She is a Corporate treasurer. She attends services at [employer].    ADVANCED DIRECTIVES: Not yet notarized; Rylann intends to name her daughter [name] as her healthcare power of attorney. She can be reached at [phone]   HEALTH MAINTENANCE: Social History  Substance Use Topics  . Smoking status: Never Smoker  . Smokeless tobacco: Not on file  . Alcohol use No     Colonoscopy: Remote  PAP:  Bone density: Remote  Lipid panel:  Allergies  Allergen Reactions  . Vicodin [Hydrocodone-Acetaminophen] Nausea Only    Current Outpatient Prescriptions  Medication Sig Dispense Refill  . anastrozole (ARIMIDEX) 1 MG tablet Take 1 tablet (1 mg total) by mouth daily. 90 tablet 4  . aspirin EC 81 MG tablet Take by mouth.    . enalapril (VASOTEC) 20 MG tablet Take 20 mg by mouth 2 (two) times daily.     Marland Kitchen levothyroxine (SYNTHROID) 100 MCG tablet Take 1 tablet (100 mcg total) by mouth daily before breakfast.  30 tablet 6  . Lutein 20 MG CAPS Take by mouth.    . Red Yeast Rice Extract (RED YEAST RICE PO) Take by mouth.    . triamterene-hydrochlorothiazide (DYAZIDE) 37.5-25 MG capsule Take 1 capsule by mouth daily.     . verapamil (CALAN-SR) 240 MG CR tablet   0   No current facility-administered medications for this visit.     OBJECTIVE: Older white woman In no acute distress  Vitals:   05/20/16 1428  BP: (!) 130/48  Pulse: 62  Resp: 18  Temp: 97.4 F (36.3 C)     Body mass index is 20.19 kg/m.    ECOG FS:1 - Symptomatic but completely ambulatory  Sclerae unicteric, pupils round and equal Oropharynx clear and moist-- no thrush or other lesions No cervical or supraclavicular adenopathy Lungs no rales or rhonchi Heart regular rate and rhythm Abd soft, nontender, positive bowel sounds MSK no  focal spinal tenderness, no upper extremity lymphedema Neuro: nonfocal, well oriented, appropriate affect Breasts: The right breast is benign. The mass in the left breast is easily palpable in the superior midline. It measures about 2-3 cm. There is no associated erythema. The breast is otherwise unremarkable in the left axilla is benign.    LAB RESULTS:  CMP     Component Value Date/Time   NA 139 05/20/2016 1421   K 4.5 05/20/2016 1421   CL 100 07/30/2006 1318   CO2 24 05/20/2016 1421   GLUCOSE 102 05/20/2016 1421   BUN 23.8 05/20/2016 1421   CREATININE 0.8 05/20/2016 1421   CALCIUM 9.8 05/20/2016 1421   PROT 6.8 05/20/2016 1421   ALBUMIN 3.6 05/20/2016 1421   AST 20 05/20/2016 1421   ALT 12 05/20/2016 1421   ALKPHOS 60 05/20/2016 1421   BILITOT 0.27 05/20/2016 1421   GFRNONAA 86 07/30/2006 1318   GFRAA 104 07/30/2006 1318    INo results found for: SPEP, UPEP  Lab Results  Component Value Date   WBC 7.2 05/20/2016   NEUTROABS 5.3 05/20/2016   HGB 11.5 (L) 05/20/2016   HCT 34.9 05/20/2016   MCV 81.7 05/20/2016   PLT 277 05/20/2016      Chemistry      Component Value Date/Time   NA 139 05/20/2016 1421   K 4.5 05/20/2016 1421   CL 100 07/30/2006 1318   CO2 24 05/20/2016 1421   BUN 23.8 05/20/2016 1421   CREATININE 0.8 05/20/2016 1421      Component Value Date/Time   CALCIUM 9.8 05/20/2016 1421   ALKPHOS 60 05/20/2016 1421   AST 20 05/20/2016 1421   ALT 12 05/20/2016 1421   BILITOT 0.27 05/20/2016 1421       No results found for: LABCA2  No components found for: LABCA125  No results for input(s): INR in the last 168 hours.  Urinalysis No results found for: COLORURINE, APPEARANCEUR, LABSPEC, PHURINE, GLUCOSEU, HGBUR, BILIRUBINUR, KETONESUR, PROTEINUR, UROBILINOGEN, NITRITE, LEUKOCYTESUR  STUDIES: Nm Pet Image Restage (ps) Whole Body  Result Date: 05/08/2016 CLINICAL DATA:  Subsequent treatment strategy for metastatic melanoma. EXAM: NUCLEAR  MEDICINE PET WHOLE BODY TECHNIQUE: 5.8 mCi F-18 FDG was injected intravenously. Full-ring PET imaging was performed from the vertex to the feet after the radiotracer. CT data was obtained and used for attenuation correction and anatomic localization. FASTING BLOOD GLUCOSE:  Value: 98 mg/dl COMPARISON:  01/02/2016 FINDINGS: HEAD/NECK No hypermetabolic lymph nodes in the neck. Age-appropriate cerebral atrophy. CHEST Posterior left axillary lymph node measures 9 mm in short axis (  formerly the same), with a maximum SUV of 6.0 (formerly 7.7) a right posterior axillary lymph node measures 1.3 cm in short axis on image 88 series 4 (formerly 1.4 cm) and has a maximum standard uptake value of 4.2 (formerly 5.2). An adjacent smaller mildly hypermetabolic lymph node is present in the same location. Medially in the left breast, a faintly hypermetabolic lesion has maximum SUV 3.9, formerly 3.1. Coronary, aortic arch, and branch vessel atherosclerotic vascular disease. ABDOMEN/PELVIS No abnormal hypermetabolic activity within the liver, pancreas, adrenal glands, or spleen. No hypermetabolic lymph nodes in the abdomen or pelvis. Physiologic activity in loops of pelvic bowel especially eccentric to the right, without definite CT correlate to suggest an underlying mass. Stable hypodense lesion in the dome of the right hepatic lobe is photopenic, favoring cyst. Other hepatic cysts noted. Suspected small gallstones in the gallbladder. Renal cysts noted. A mildly hyperdense 1.1 cm left mid kidney lesion posteriorly is nonspecific but does not demonstrate definite hypermetabolic activity, and is probably a complex cyst. Aortoiliac atherosclerotic vascular disease. Sigmoid colon diverticulosis. Speckled calcifications in the parametrial vasculature. SKELETON/EXTREMITIES Left humeral head tumor implant maximum SUV 14.5, previously 16.1. Left distal the latissimus dorsi mass near the humeral attachment, maximum SUV 22.8, previously 26.1.  Old healed right humeral fracture without hypermetabolic activity. Thoracic kyphosis. Left total hip prosthesis. Severe degenerative arthropathy of both knees. Considerable degenerative arthropathy of the Lisfranc joints, right greater than left. IMPRESSION: 1. The various hypermetabolic lesions appear generally mildly improved in overall activity. These include the lesions in the left humeral head; the lesion in the left distal latissimus dorsi muscle ; and the bilateral posterior axillary lymph nodes. 2. The faintly hypermetabolic lesion medially in the left breast, however, is slightly more metabolic than prior, currently 3.9, previously 3.1. 3. Other imaging findings of potential clinical significance: Cerebral atrophy. Coronary, aortic arch, and branch vessel atherosclerotic vascular disease. Aortoiliac atherosclerotic vascular disease. The hepatic and renal cysts (a left mid kidney posterior lesion is probably a complex cyst). Sigmoid colon diverticulosis. Old healed right humeral fracture. Left total hip prosthesis. Severe degenerative arthropathy of both knees. Considerable degenerative arthropathy at the Lisfranc joints, right greater than left. Speckled calcifications in the parametrial vasculature. Electronically Signed   By: Van Clines M.D.   On: 05/08/2016 15:35    ASSESSMENT: 82 y.o. [place] woman with concurrent breast cancer and melanoma  BREAST CANCER: (1) left breast upper inner quadrant biopsy 06/06/2015 shows a clinical T2 NX, stage II invasive ductal carcinoma , grade 2, estrogen receptor and progesterone receptor strongly positive, with an MIB-1 of 5%, and no HER-2 amplification, the signals ratio being 1.42 and the number per cell 2.55.   (2) anastrozole started 07/01/2015  (a) bone density February 2017 shows osteoporosis with a T score of -2.5  MALIGNANT MELANOMA: (a) shave biopsy from the mid upper back 04/17/2015 shows invasive malignant melanoma, Breslow depth 6.5  mm plus, Clark's level 4+, with 23 mitoses per square millimeter, extensive ulceration,, with involved edges, and non-brisk host response  (i) biopsy of right and left axillary lymph nodes 06/06/2015 showed metastatic melanoma, BRAF wild type  (ii) PET scan 07/01/2015 shows bilateral axillary adenopathy, a left latissimus mass, a left pararenal mass, and 2 liver lesions  (iii) PET scan 12/06/2017shows continuing improvement in hypermetabolic lesions (except for left breast lesion on  (b) pembrolizumab started 07/16/2015, repeated every 21 days  (i) increased TSH noted June 2018, corrected on Synthroid   PLAN: Fe is doing remarkably well  as far as her metastatic melanoma is concerned. She continues to benefit from the pembrolizumab. She tolerates it well. Today was a decision point and after adding up the pros and cons we decided we are going to continue the pembrolizumab for another year.  The concern of course is the fact that the mass in the left breast, which of course is breast cancer and not melanoma, appears little bit "hot or" on the most recent PET scan. She is in any case due for mammography in January. I am adding a left breast ultrasound. If there has been any significant growth I will refer her back to Dr. [name] for consideration of lumpectomy.  We have been doing PET scans about every 3 months. We are going to move that to every 6 months for the next year  Her thyroid function has normalized on her current dose of Synthroid. This is being repeated today.  Otherwise she knows to call for any problems that may develop before her next visit here.  Chauncey Cruel, MD   05/20/2016 3:18 PM

## 2016-05-20 NOTE — Telephone Encounter (Signed)
sch next two lab/inf appts per pt dtr request. Pt transportation due to start new job and will call back to sch follow up with GM and sch future infusion appts. Pt dtr will call [facility] directly to set up mammo /us ordered.

## 2016-06-10 ENCOUNTER — Ambulatory Visit (HOSPITAL_BASED_OUTPATIENT_CLINIC_OR_DEPARTMENT_OTHER): Payer: Medicare HMO

## 2016-06-10 ENCOUNTER — Other Ambulatory Visit (HOSPITAL_BASED_OUTPATIENT_CLINIC_OR_DEPARTMENT_OTHER): Payer: Medicare HMO

## 2016-06-10 VITALS — BP 148/64 | HR 73 | Temp 98.0°F | Resp 17

## 2016-06-10 DIAGNOSIS — C773 Secondary and unspecified malignant neoplasm of axilla and upper limb lymph nodes: Secondary | ICD-10-CM

## 2016-06-10 DIAGNOSIS — C438 Malignant melanoma of overlapping sites of skin: Secondary | ICD-10-CM

## 2016-06-10 DIAGNOSIS — C439 Malignant melanoma of skin, unspecified: Secondary | ICD-10-CM

## 2016-06-10 DIAGNOSIS — C4359 Malignant melanoma of other part of trunk: Secondary | ICD-10-CM

## 2016-06-10 DIAGNOSIS — C50212 Malignant neoplasm of upper-inner quadrant of left female breast: Secondary | ICD-10-CM | POA: Diagnosis not present

## 2016-06-10 DIAGNOSIS — C787 Secondary malignant neoplasm of liver and intrahepatic bile duct: Secondary | ICD-10-CM

## 2016-06-10 DIAGNOSIS — Z5112 Encounter for antineoplastic immunotherapy: Secondary | ICD-10-CM | POA: Diagnosis not present

## 2016-06-10 LAB — CBC WITH DIFFERENTIAL/PLATELET
BASO%: 0.5 % (ref 0.0–2.0)
Basophils Absolute: 0.1 10*3/uL (ref 0.0–0.1)
EOS%: 0 % (ref 0.0–7.0)
Eosinophils Absolute: 0 10*3/uL (ref 0.0–0.5)
HCT: 36.9 % (ref 34.8–46.6)
HGB: 12.3 g/dL (ref 11.6–15.9)
LYMPH%: 8.3 % — AB (ref 14.0–49.7)
MCH: 27.1 pg (ref 25.1–34.0)
MCHC: 33.3 g/dL (ref 31.5–36.0)
MCV: 81.5 fL (ref 79.5–101.0)
MONO#: 0.7 10*3/uL (ref 0.1–0.9)
MONO%: 6.4 % (ref 0.0–14.0)
NEUT%: 84.8 % — AB (ref 38.4–76.8)
NEUTROS ABS: 9.2 10*3/uL — AB (ref 1.5–6.5)
Platelets: 391 10*3/uL (ref 145–400)
RBC: 4.53 10*6/uL (ref 3.70–5.45)
RDW: 14.2 % (ref 11.2–14.5)
WBC: 10.9 10*3/uL — AB (ref 3.9–10.3)
lymph#: 0.9 10*3/uL (ref 0.9–3.3)

## 2016-06-10 LAB — COMPREHENSIVE METABOLIC PANEL
ALBUMIN: 3.7 g/dL (ref 3.5–5.0)
ALK PHOS: 74 U/L (ref 40–150)
ALT: 17 U/L (ref 0–55)
AST: 20 U/L (ref 5–34)
Anion Gap: 13 mEq/L — ABNORMAL HIGH (ref 3–11)
BILIRUBIN TOTAL: 0.29 mg/dL (ref 0.20–1.20)
BUN: 33.3 mg/dL — AB (ref 7.0–26.0)
CO2: 25 mEq/L (ref 22–29)
CREATININE: 0.8 mg/dL (ref 0.6–1.1)
Calcium: 10.8 mg/dL — ABNORMAL HIGH (ref 8.4–10.4)
Chloride: 103 mEq/L (ref 98–109)
EGFR: 65 mL/min/{1.73_m2} — ABNORMAL LOW (ref >90)
GLUCOSE: 107 mg/dL (ref 70–140)
POTASSIUM: 4.2 meq/L (ref 3.5–5.1)
Sodium: 141 mEq/L (ref 136–145)
TOTAL PROTEIN: 7.5 g/dL (ref 6.4–8.3)

## 2016-06-10 LAB — LACTATE DEHYDROGENASE: LDH: 208 U/L (ref 125–245)

## 2016-06-10 MED ORDER — SODIUM CHLORIDE 0.9 % IV SOLN
Freq: Once | INTRAVENOUS | Status: AC
  Administered 2016-06-10: 15:00:00 via INTRAVENOUS

## 2016-06-10 MED ORDER — SODIUM CHLORIDE 0.9 % IV SOLN
2.0000 mg/kg | Freq: Once | INTRAVENOUS | Status: AC
  Administered 2016-06-10: 100 mg via INTRAVENOUS
  Filled 2016-06-10: qty 4

## 2016-06-10 NOTE — Patient Instructions (Signed)
[facility] Discharge Instructions for Patients Receiving Chemotherapy  Today you received the following chemotherapy agents: Keytruda   To help prevent nausea and vomiting after your treatment, we encourage you to take your nausea medication as directed    If you develop nausea and vomiting that is not controlled by your nausea medication, call the clinic.   BELOW ARE SYMPTOMS THAT SHOULD BE REPORTED IMMEDIATELY:  *FEVER GREATER THAN 100.5 F  *CHILLS WITH OR WITHOUT FEVER  NAUSEA AND VOMITING THAT IS NOT CONTROLLED WITH YOUR NAUSEA MEDICATION  *UNUSUAL SHORTNESS OF BREATH  *UNUSUAL BRUISING OR BLEEDING  TENDERNESS IN MOUTH AND THROAT WITH OR WITHOUT PRESENCE OF ULCERS  *URINARY PROBLEMS  *BOWEL PROBLEMS  UNUSUAL RASH Items with * indicate a potential emergency and should be followed up as soon as possible.  Feel free to call the clinic you have any questions or concerns. The clinic phone number is [phone].  Please show the CHEMO ALERT CARD at check-in to the Emergency Department and triage nurse.   

## 2016-06-17 ENCOUNTER — Other Ambulatory Visit: Payer: Medicare HMO

## 2016-06-23 ENCOUNTER — Ambulatory Visit
Admission: RE | Admit: 2016-06-23 | Discharge: 2016-06-23 | Disposition: A | Discharge status: Home / Self Care | Payer: Medicare HMO | Source: Ambulatory Visit | Attending: Oncology | Admitting: Oncology

## 2016-06-23 DIAGNOSIS — Z170 Estrogen receptor positive status [ER+]: Secondary | ICD-10-CM

## 2016-06-23 DIAGNOSIS — C50212 Malignant neoplasm of upper-inner quadrant of left female breast: Secondary | ICD-10-CM

## 2016-06-23 DIAGNOSIS — C438 Malignant melanoma of overlapping sites of skin: Secondary | ICD-10-CM

## 2016-07-01 ENCOUNTER — Other Ambulatory Visit (HOSPITAL_BASED_OUTPATIENT_CLINIC_OR_DEPARTMENT_OTHER): Payer: Medicare HMO

## 2016-07-01 ENCOUNTER — Ambulatory Visit (HOSPITAL_BASED_OUTPATIENT_CLINIC_OR_DEPARTMENT_OTHER): Payer: Medicare HMO

## 2016-07-01 VITALS — BP 140/49 | HR 63 | Temp 98.0°F | Resp 16

## 2016-07-01 DIAGNOSIS — C4359 Malignant melanoma of other part of trunk: Secondary | ICD-10-CM

## 2016-07-01 DIAGNOSIS — C438 Malignant melanoma of overlapping sites of skin: Secondary | ICD-10-CM

## 2016-07-01 DIAGNOSIS — C50212 Malignant neoplasm of upper-inner quadrant of left female breast: Secondary | ICD-10-CM

## 2016-07-01 DIAGNOSIS — C439 Malignant melanoma of skin, unspecified: Secondary | ICD-10-CM

## 2016-07-01 DIAGNOSIS — C773 Secondary and unspecified malignant neoplasm of axilla and upper limb lymph nodes: Secondary | ICD-10-CM

## 2016-07-01 DIAGNOSIS — Z5112 Encounter for antineoplastic immunotherapy: Secondary | ICD-10-CM | POA: Diagnosis not present

## 2016-07-01 DIAGNOSIS — Z79899 Other long term (current) drug therapy: Secondary | ICD-10-CM

## 2016-07-01 DIAGNOSIS — C787 Secondary malignant neoplasm of liver and intrahepatic bile duct: Secondary | ICD-10-CM

## 2016-07-01 LAB — COMPREHENSIVE METABOLIC PANEL
ALT: 12 U/L (ref 0–55)
ANION GAP: 10 meq/L (ref 3–11)
AST: 21 U/L (ref 5–34)
Albumin: 3.6 g/dL (ref 3.5–5.0)
Alkaline Phosphatase: 65 U/L (ref 40–150)
BUN: 26.9 mg/dL — ABNORMAL HIGH (ref 7.0–26.0)
CHLORIDE: 106 meq/L (ref 98–109)
CO2: 24 meq/L (ref 22–29)
Calcium: 9.8 mg/dL (ref 8.4–10.4)
Creatinine: 0.8 mg/dL (ref 0.6–1.1)
EGFR: 70 mL/min/{1.73_m2} — AB (ref >90)
GLUCOSE: 110 mg/dL (ref 70–140)
Potassium: 4.3 mEq/L (ref 3.5–5.1)
SODIUM: 139 meq/L (ref 136–145)
Total Bilirubin: <0.22 mg/dL (ref 0.20–1.20)
Total Protein: 6.7 g/dL (ref 6.4–8.3)

## 2016-07-01 LAB — CBC WITH DIFFERENTIAL/PLATELET
BASO%: 1 % (ref 0.0–2.0)
Basophils Absolute: 0.1 10*3/uL (ref 0.0–0.1)
EOS%: 0 % (ref 0.0–7.0)
Eosinophils Absolute: 0 10*3/uL (ref 0.0–0.5)
HCT: 34.4 % — ABNORMAL LOW (ref 34.8–46.6)
HGB: 11.4 g/dL — ABNORMAL LOW (ref 11.6–15.9)
LYMPH%: 12.8 % — AB (ref 14.0–49.7)
MCH: 27.6 pg (ref 25.1–34.0)
MCHC: 33.2 g/dL (ref 31.5–36.0)
MCV: 83.3 fL (ref 79.5–101.0)
MONO#: 0.8 10*3/uL (ref 0.1–0.9)
MONO%: 10.6 % (ref 0.0–14.0)
NEUT#: 5.4 10*3/uL (ref 1.5–6.5)
NEUT%: 75.6 % (ref 38.4–76.8)
PLATELETS: 283 10*3/uL (ref 145–400)
RBC: 4.13 10*6/uL (ref 3.70–5.45)
RDW: 14.7 % — ABNORMAL HIGH (ref 11.2–14.5)
WBC: 7.2 10*3/uL (ref 3.9–10.3)
lymph#: 0.9 10*3/uL (ref 0.9–3.3)

## 2016-07-01 LAB — TSH: TSH: 2.122 m(IU)/L (ref 0.308–3.960)

## 2016-07-01 LAB — LACTATE DEHYDROGENASE: LDH: 175 U/L (ref 125–245)

## 2016-07-01 MED ORDER — SODIUM CHLORIDE 0.9 % IV SOLN
2.0000 mg/kg | Freq: Once | INTRAVENOUS | Status: AC
  Administered 2016-07-01: 100 mg via INTRAVENOUS
  Filled 2016-07-01: qty 4

## 2016-07-01 MED ORDER — SODIUM CHLORIDE 0.9 % IV SOLN
Freq: Once | INTRAVENOUS | Status: AC
  Administered 2016-07-01: 16:00:00 via INTRAVENOUS

## 2016-07-01 NOTE — Patient Instructions (Signed)
[facility] Center Discharge Instructions for Patients Receiving Chemotherapy  Today you received the following chemotherapy agents: Keytruda   To help prevent nausea and vomiting after your treatment, we encourage you to take your nausea medication as directed    If you develop nausea and vomiting that is not controlled by your nausea medication, call the clinic.   BELOW ARE SYMPTOMS THAT SHOULD BE REPORTED IMMEDIATELY:  *FEVER GREATER THAN 100.5 F  *CHILLS WITH OR WITHOUT FEVER  NAUSEA AND VOMITING THAT IS NOT CONTROLLED WITH YOUR NAUSEA MEDICATION  *UNUSUAL SHORTNESS OF BREATH  *UNUSUAL BRUISING OR BLEEDING  TENDERNESS IN MOUTH AND THROAT WITH OR WITHOUT PRESENCE OF ULCERS  *URINARY PROBLEMS  *BOWEL PROBLEMS  UNUSUAL RASH Items with * indicate a potential emergency and should be followed up as soon as possible.  Feel free to call the clinic you have any questions or concerns. The clinic phone number is [phone].  Please show the CHEMO ALERT CARD at check-in to the Emergency Department and triage nurse.   

## 2016-07-14 ENCOUNTER — Other Ambulatory Visit: Payer: Self-pay | Admitting: Oncology

## 2016-07-14 NOTE — Telephone Encounter (Signed)
Chart reviewed.

## 2016-07-22 ENCOUNTER — Ambulatory Visit (HOSPITAL_BASED_OUTPATIENT_CLINIC_OR_DEPARTMENT_OTHER): Payer: Medicare HMO

## 2016-07-22 ENCOUNTER — Encounter: Payer: Self-pay | Admitting: General Practice

## 2016-07-22 ENCOUNTER — Other Ambulatory Visit (HOSPITAL_BASED_OUTPATIENT_CLINIC_OR_DEPARTMENT_OTHER): Payer: Medicare HMO

## 2016-07-22 VITALS — BP 113/47 | HR 66 | Temp 98.1°F | Resp 16

## 2016-07-22 DIAGNOSIS — C438 Malignant melanoma of overlapping sites of skin: Secondary | ICD-10-CM

## 2016-07-22 DIAGNOSIS — C50212 Malignant neoplasm of upper-inner quadrant of left female breast: Secondary | ICD-10-CM

## 2016-07-22 DIAGNOSIS — Z5112 Encounter for antineoplastic immunotherapy: Secondary | ICD-10-CM

## 2016-07-22 DIAGNOSIS — C4359 Malignant melanoma of other part of trunk: Secondary | ICD-10-CM

## 2016-07-22 DIAGNOSIS — C439 Malignant melanoma of skin, unspecified: Secondary | ICD-10-CM

## 2016-07-22 DIAGNOSIS — C773 Secondary and unspecified malignant neoplasm of axilla and upper limb lymph nodes: Secondary | ICD-10-CM | POA: Diagnosis not present

## 2016-07-22 DIAGNOSIS — C787 Secondary malignant neoplasm of liver and intrahepatic bile duct: Secondary | ICD-10-CM

## 2016-07-22 LAB — CBC WITH DIFFERENTIAL/PLATELET
BASO%: 0.5 % (ref 0.0–2.0)
BASOS ABS: 0.1 10*3/uL (ref 0.0–0.1)
EOS ABS: 0 10*3/uL (ref 0.0–0.5)
EOS%: 0 % (ref 0.0–7.0)
HCT: 36.1 % (ref 34.8–46.6)
HEMOGLOBIN: 12.2 g/dL (ref 11.6–15.9)
LYMPH%: 6.3 % — AB (ref 14.0–49.7)
MCH: 28 pg (ref 25.1–34.0)
MCHC: 33.8 g/dL (ref 31.5–36.0)
MCV: 82.8 fL (ref 79.5–101.0)
MONO#: 1 10*3/uL — ABNORMAL HIGH (ref 0.1–0.9)
MONO%: 8.9 % (ref 0.0–14.0)
NEUT#: 9.1 10*3/uL — ABNORMAL HIGH (ref 1.5–6.5)
NEUT%: 84.3 % — AB (ref 38.4–76.8)
Platelets: 304 10*3/uL (ref 145–400)
RBC: 4.36 10*6/uL (ref 3.70–5.45)
RDW: 14.1 % (ref 11.2–14.5)
WBC: 10.8 10*3/uL — ABNORMAL HIGH (ref 3.9–10.3)
lymph#: 0.7 10*3/uL — ABNORMAL LOW (ref 0.9–3.3)

## 2016-07-22 LAB — COMPREHENSIVE METABOLIC PANEL
ALBUMIN: 3.7 g/dL (ref 3.5–5.0)
ALK PHOS: 69 U/L (ref 40–150)
ALT: 15 U/L (ref 0–55)
AST: 22 U/L (ref 5–34)
Anion Gap: 11 mEq/L (ref 3–11)
BUN: 21.5 mg/dL (ref 7.0–26.0)
CO2: 23 mEq/L (ref 22–29)
Calcium: 10.1 mg/dL (ref 8.4–10.4)
Chloride: 103 mEq/L (ref 98–109)
Creatinine: 0.7 mg/dL (ref 0.6–1.1)
EGFR: 73 mL/min/{1.73_m2} — AB (ref >90)
GLUCOSE: 101 mg/dL (ref 70–140)
POTASSIUM: 4.4 meq/L (ref 3.5–5.1)
SODIUM: 137 meq/L (ref 136–145)
Total Bilirubin: 0.29 mg/dL (ref 0.20–1.20)
Total Protein: 6.9 g/dL (ref 6.4–8.3)

## 2016-07-22 LAB — LACTATE DEHYDROGENASE: LDH: 196 U/L (ref 125–245)

## 2016-07-22 MED ORDER — SODIUM CHLORIDE 0.9 % IV SOLN
Freq: Once | INTRAVENOUS | Status: AC
  Administered 2016-07-22: 11:00:00 via INTRAVENOUS

## 2016-07-22 MED ORDER — SODIUM CHLORIDE 0.9 % IV SOLN
2.0000 mg/kg | Freq: Once | INTRAVENOUS | Status: AC
  Administered 2016-07-22: 100 mg via INTRAVENOUS
  Filled 2016-07-22: qty 4

## 2016-07-22 NOTE — Progress Notes (Signed)
[facility] Spiritual Care Note  Followed up with Danielle [name] and her daughter Danielle [name] Investment banker, corporate at [employer]) in infusion, providing empathic listening, encouragement, and [place] per request.  Danielle [name] is investigating resources that are available to her as a caregiver and/or for her to take her mother to.  They were very appreciative and are aware of ongoing Support Team availability.  Please also page if immediate needs arise.   [place], [place], [facility] Pager [phone] Voicemail [phone]

## 2016-07-22 NOTE — Patient Instructions (Signed)
[facility] Discharge Instructions for Patients Receiving Chemotherapy  Today you received the following chemotherapy agents: Keytruda   To help prevent nausea and vomiting after your treatment, we encourage you to take your nausea medication as directed    If you develop nausea and vomiting that is not controlled by your nausea medication, call the clinic.   BELOW ARE SYMPTOMS THAT SHOULD BE REPORTED IMMEDIATELY:  *FEVER GREATER THAN 100.5 F  *CHILLS WITH OR WITHOUT FEVER  NAUSEA AND VOMITING THAT IS NOT CONTROLLED WITH YOUR NAUSEA MEDICATION  *UNUSUAL SHORTNESS OF BREATH  *UNUSUAL BRUISING OR BLEEDING  TENDERNESS IN MOUTH AND THROAT WITH OR WITHOUT PRESENCE OF ULCERS  *URINARY PROBLEMS  *BOWEL PROBLEMS  UNUSUAL RASH Items with * indicate a potential emergency and should be followed up as soon as possible.  Feel free to call the clinic you have any questions or concerns. The clinic phone number is [phone].  Please show the CHEMO ALERT CARD at check-in to the Emergency Department and triage nurse.   

## 2016-08-12 ENCOUNTER — Other Ambulatory Visit (HOSPITAL_BASED_OUTPATIENT_CLINIC_OR_DEPARTMENT_OTHER): Payer: Medicare HMO

## 2016-08-12 ENCOUNTER — Ambulatory Visit (HOSPITAL_BASED_OUTPATIENT_CLINIC_OR_DEPARTMENT_OTHER): Payer: Medicare HMO

## 2016-08-12 ENCOUNTER — Ambulatory Visit (HOSPITAL_BASED_OUTPATIENT_CLINIC_OR_DEPARTMENT_OTHER): Payer: Medicare HMO | Admitting: Oncology

## 2016-08-12 VITALS — BP 124/46 | HR 66 | Temp 97.3°F | Resp 17 | Ht 64.0 in | Wt 120.7 lb

## 2016-08-12 DIAGNOSIS — Z170 Estrogen receptor positive status [ER+]: Secondary | ICD-10-CM | POA: Diagnosis not present

## 2016-08-12 DIAGNOSIS — C4359 Malignant melanoma of other part of trunk: Secondary | ICD-10-CM

## 2016-08-12 DIAGNOSIS — Z5112 Encounter for antineoplastic immunotherapy: Secondary | ICD-10-CM | POA: Diagnosis not present

## 2016-08-12 DIAGNOSIS — C438 Malignant melanoma of overlapping sites of skin: Secondary | ICD-10-CM

## 2016-08-12 DIAGNOSIS — C50212 Malignant neoplasm of upper-inner quadrant of left female breast: Secondary | ICD-10-CM

## 2016-08-12 DIAGNOSIS — C787 Secondary malignant neoplasm of liver and intrahepatic bile duct: Secondary | ICD-10-CM

## 2016-08-12 DIAGNOSIS — C439 Malignant melanoma of skin, unspecified: Secondary | ICD-10-CM

## 2016-08-12 LAB — CBC WITH DIFFERENTIAL/PLATELET
BASO%: 0.8 % (ref 0.0–2.0)
BASOS ABS: 0.1 10*3/uL (ref 0.0–0.1)
EOS%: 0 % (ref 0.0–7.0)
Eosinophils Absolute: 0 10*3/uL (ref 0.0–0.5)
HEMATOCRIT: 39.8 % (ref 34.8–46.6)
HEMOGLOBIN: 13.3 g/dL (ref 11.6–15.9)
LYMPH#: 0.9 10*3/uL (ref 0.9–3.3)
LYMPH%: 10.7 % — ABNORMAL LOW (ref 14.0–49.7)
MCH: 27.7 pg (ref 25.1–34.0)
MCHC: 33.4 g/dL (ref 31.5–36.0)
MCV: 83 fL (ref 79.5–101.0)
MONO#: 0.8 10*3/uL (ref 0.1–0.9)
MONO%: 9.4 % (ref 0.0–14.0)
NEUT#: 6.5 10*3/uL (ref 1.5–6.5)
NEUT%: 79.1 % — ABNORMAL HIGH (ref 38.4–76.8)
Platelets: 292 10*3/uL (ref 145–400)
RBC: 4.79 10*6/uL (ref 3.70–5.45)
RDW: 13.9 % (ref 11.2–14.5)
WBC: 8.3 10*3/uL (ref 3.9–10.3)

## 2016-08-12 LAB — COMPREHENSIVE METABOLIC PANEL
ALBUMIN: 3.9 g/dL (ref 3.5–5.0)
ALK PHOS: 69 U/L (ref 40–150)
ALT: 13 U/L (ref 0–55)
ANION GAP: 12 meq/L — AB (ref 3–11)
AST: 21 U/L (ref 5–34)
BUN: 20.4 mg/dL (ref 7.0–26.0)
CHLORIDE: 104 meq/L (ref 98–109)
CO2: 23 mEq/L (ref 22–29)
Calcium: 10.6 mg/dL — ABNORMAL HIGH (ref 8.4–10.4)
Creatinine: 0.8 mg/dL (ref 0.6–1.1)
EGFR: 68 mL/min/{1.73_m2} — AB (ref >90)
Glucose: 100 mg/dl (ref 70–140)
POTASSIUM: 4.3 meq/L (ref 3.5–5.1)
Sodium: 139 mEq/L (ref 136–145)
Total Bilirubin: 0.26 mg/dL (ref 0.20–1.20)
Total Protein: 7.2 g/dL (ref 6.4–8.3)

## 2016-08-12 MED ORDER — SODIUM CHLORIDE 0.9 % IV SOLN
Freq: Once | INTRAVENOUS | Status: AC
  Administered 2016-08-12: 11:00:00 via INTRAVENOUS

## 2016-08-12 MED ORDER — ENALAPRIL MALEATE 10 MG PO TABS: 10.0000 mg | ORAL_TABLET | Freq: Every day | ORAL | 4 refills | Status: DC

## 2016-08-12 MED ORDER — SODIUM CHLORIDE 0.9 % IV SOLN
2.0000 mg/kg | Freq: Once | INTRAVENOUS | Status: AC
  Administered 2016-08-12: 100 mg via INTRAVENOUS
  Filled 2016-08-12: qty 4

## 2016-08-12 NOTE — Progress Notes (Signed)
Danielle [name]  [phone] [phone]) [phone]     ID: Danielle [name] DOB: 1926-05-08  MR#: [id number]  FGH#:[id number]  Patient Care Team: Bartholome Bill, MD as PCP - General (Family Medicine) Armandina Gemma, MD as Consulting Physician (General Surgery) Chauncey Cruel, MD as Consulting Physician (Oncology) Allyn Kenner, MD (Dermatology) Gaynelle Arabian, MD as Consulting Physician (Orthopedic Surgery) PCP: Bartholome Bill, MD GYN: OTHER MD:  CHIEF COMPLAINT: Estrogen receptor positive breast cancer; stage IV malignant melanoma  CURRENT TREATMENT: Anastrozole, pembrolizumab   BREAST CANCER HISTORY: From the original intake note:  Danielle [name] she had an itchy spot in the middle of her back which she scratched sometimes, but this was not evaluated until Danielle [name] fell off a chair while changing a light bulb and broke her left upper extremity. Because she couldn't change while in a sling, her daughter Danielle [name] had 2 give her a hand and in the process noted a black lesion in the middle of Danielle [name]'s back. She took married to Danielle. [name] office where on 04/17/2015 he performed a shave biopsy of a pigmented lesion over the spine in the upper back. The pathology report ([id number]) showed a breast low depth of at least 6.5 mm, with the anatomic level at least 4. There were 23 mitoses per square millimeter and extensive ulceration. There was no satellitosis. There was no evidence of regression. They host response was not brisk. This was read as a pathology stage T4b and the patient was referred to Danielle [name] for wide excision.  However on exam Danielle [name] noted bilateral axillary adenopathy and also a mass in the upper inner quadrant of the left breast. He set Danielle [name] up for bilateral mammography with tomography 06/06/2015, and this showed an irregular hyperdense mass in the left breast upper inner quadrant which was palpable. There was also a firm palpable mass in the left axilla as well as in  the contralateral, right axilla. Ultrasonography showed the left breast mass to measure 2.7 cm and at least 2 abnormally enlarged lymph nodes in the left axilla the larger one measuring 3.9 cm. In the right axilla there were also 2 abnormal appearing lymph nodes, the larger measuring 2.6 cm.  On 06/06/2015 Danielle [name] underwent biopsy of the left breast mass and both axillary adenopathy areas. The left breast mass proved to be an invasive ductal carcinoma , grade 2, estrogen and progesterone receptor positive, both at 100%, with strong staining intensity. HER-2 was not amplified, with a signals ratio of 1.42, the average copy number per cell being 2.55. The proliferation marker was 5%.  More importantly, both axillary lymph nodes showed metastatic melanoma. We were contacted and prior to today's visit we obtained a PET scan, which shows in addition to the left breast mass and the bilateral axillary lymph nodes (described as just deep to the latissimus dorsi muscles bilaterally) 2 hypermetabolic foci in the left lobe of the liver, measuring 1.3 and 0.8 cm, with an SUV max of 6.3. A 1.5 cm mass next to the left [name], clearly separate from the adrenal gland, is also hypermetabolic with an SUV max of 8.2. There were no lung or bone lesions.  Danielle [name] subsequent history is as detailed below   INTERVAL HISTORY: Danielle [name] returns today for follow-up of her metastatic malignant melanoma and breast cancer. Her daughter is with her. The daughter does have a bad cold which actually may be a flu problem. She has not been tested and has not had Tamiflu. We have suggested she  really stay away from her mother unless absolutely necessary until she is completely clinically well.  [name] herself is doing terrific. She tolerates anastrozole with no significant side effects.Hot flashes and vaginal dryness are not a major issue. She never developed the arthralgias or myalgias that many patients can experience on this medication. She obtains  it at a good price.  We had noted some increased activity in the breast mass on PET scan in December, but we obtained a mammogram in January which shows continuing response. This is copy below  As far as the pembrolizumab is concerned she has no side effects that she is aware of. She did become hypothyroid with the treatment. She is on a stable dose of Synthroid and her most recent TSH was in the 2.0 range   REVIEW OF SYSTEMS: Danielle [name] is doing a lot of reading. She is currently reading about the [employer]ish people. She tells me she became displaced with murder mysteries because they had "many other things that have nothing to do with the plot". She is using a time or to get her out of her chair every 15 or 30 minutes. When it rings she takes a walk around the house or goes out against the mail. [facility] however [facility] which is something she did before. Aside from these issues am times she feels a little bit dizzy and her daughter thinks her blood pressure medication is a little bit too high. A detailed review of systems today was otherwise noncontributory   PAST MEDICAL HISTORY: No past medical history on file. Remote history of rectal prolapse with chronic constipation resulting, history of left humeral fracture, osteopenia  PAST SURGICAL HISTORY: Past Surgical History:  Procedure Laterality Date  . HIP SURGERY      FAMILY HISTORY No family history on file. The patient's father died at the age of 32 in the setting of a call abuse. The patient's mother died from "old age" at 15. The patient had 4 brothers, no sisters. One brother died in his 73s from brain cancer. Another died from a stroke, and other from emphysema. The 26, youngest brother is 59 and jogs every day. There is no history of breast or ovarian cancer in the family. There is also no prior history of melanoma.  GYNECOLOGIC HISTORY:  No LMP recorded. Patient is postmenopausal. Menarche age 89, first live birth age  51, the patient is [place] P4. She stopped having periods in her early 60s. She did not use hormone replacement. She never took oral contraceptives.  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 She is widowed. She is now retired, and lives alone, with no pets. Her son Danielle [name] lives in [place], and is a retired Optometrist. Son Danielle [name] lives in [place] and is a Chief of Staff. Daughter Danielle [name] is a Marine scientist at [name] locally. The fourth child, Danielle [name], died in an automobile accident at age 22. The patient has 4 grandchildren, no great grandchildren. She is a Corporate treasurer. She attends services at [employer].    ADVANCED DIRECTIVES: Not yet notarized; Danielle [name] intends to name her daughter Danielle [name] as her healthcare power of attorney. She can be reached at [phone]   HEALTH MAINTENANCE: Social History  Substance Use Topics  . Smoking status: Never Smoker  . Smokeless tobacco: Not on file  . Alcohol use No     Colonoscopy: Remote  PAP:  Bone  density: Remote  Lipid panel:  Allergies  Allergen Reactions  . Vicodin [Hydrocodone-Acetaminophen] Nausea Only    Current Outpatient Prescriptions  Medication Sig Dispense Refill  . anastrozole (ARIMIDEX) 1 MG tablet Take 1 tablet (1 mg total) by mouth daily. 90 tablet 4  . aspirin EC 81 MG tablet Take by mouth.    . enalapril (VASOTEC) 10 MG tablet Take 1 tablet (10 mg total) by mouth daily. 90 tablet 4  . levothyroxine (SYNTHROID, LEVOTHROID) 100 MCG tablet TAKE ONE TABLET BY MOUTH DAILY BEFORE BREAKFAST 30 tablet 11  . Lutein 20 MG CAPS Take by mouth.    . Red Yeast Rice Extract (RED YEAST RICE PO) Take by mouth.    . triamterene-hydrochlorothiazide (DYAZIDE) 37.5-25 MG capsule Take 1 capsule by mouth daily.     . verapamil (CALAN-SR) 240 MG CR tablet   0   No current facility-administered medications for this visit.     OBJECTIVE: Older white woman Will appears  well   Vitals:   08/12/16 1035  BP: (!) 124/46  Pulse: 66  Resp: 17  Temp: 97.3 F (36.3 C)     Body mass index is 20.72 kg/m.    ECOG FS:1 - Symptomatic but completely ambulatory  Sclerae unicteric, EOMs intact Oropharynx clear and moist No cervical or supraclavicular adenopathy Lungs no rales or rhonchi Heart regular rate and rhythm Abd soft, nontender, positive bowel sounds MSK no focal spinal tenderness, no upper extremity lymphedema Neuro: nonfocal, well oriented, appropriate affect Breasts: I do not palpate a well-defined mass in the left breast today. Both axillae are benign.   LAB RESULTS:  CMP     Component Value Date/Time   NA 137 07/22/2016 1056   K 4.4 07/22/2016 1056   CL 100 07/30/2006 1318   CO2 23 07/22/2016 1056   GLUCOSE 101 07/22/2016 1056   BUN 21.5 07/22/2016 1056   CREATININE 0.7 07/22/2016 1056   CALCIUM 10.1 07/22/2016 1056   PROT 6.9 07/22/2016 1056   ALBUMIN 3.7 07/22/2016 1056   AST 22 07/22/2016 1056   ALT 15 07/22/2016 1056   ALKPHOS 69 07/22/2016 1056   BILITOT 0.29 07/22/2016 1056   GFRNONAA 86 07/30/2006 1318   GFRAA 104 07/30/2006 1318    INo results found for: SPEP, UPEP  Lab Results  Component Value Date   WBC 8.3 08/12/2016   NEUTROABS 6.5 08/12/2016   HGB 13.3 08/12/2016   HCT 39.8 08/12/2016   MCV 83.0 08/12/2016   PLT 292 08/12/2016      Chemistry      Component Value Date/Time   NA 137 07/22/2016 1056   K 4.4 07/22/2016 1056   CL 100 07/30/2006 1318   CO2 23 07/22/2016 1056   BUN 21.5 07/22/2016 1056   CREATININE 0.7 07/22/2016 1056      Component Value Date/Time   CALCIUM 10.1 07/22/2016 1056   ALKPHOS 69 07/22/2016 1056   AST 22 07/22/2016 1056   ALT 15 07/22/2016 1056   BILITOT 0.29 07/22/2016 1056       Ref Range & Units 28moago 249mogo 49m30moo 4mo22mo   TSH 0.308 - 3.960 m(IU)/L 2.122         No results found for: LABCA2  No components found for: LABCA125  No results for input(s): INR in  the last 168 hours.  Urinalysis No results found for: COLORURINE, APPEARANCEUR, LABSPEC, PHURINE, GLUCOSEU, HGBUR, BILIRUBINUR, KETONESUR, PROTEINUR, UROBILINOGEN, NITRITE, LEUKOCYTESUR  STUDIES: CLINICAL DATA:  89 y27r old female  presenting for evaluation of interval change in size of the known left breast cancer which was diagnosed in January of 2017. The patient has been treated with Anastrazole for the breast cancer, but has not undergone lumpectomy. The patient is also undergoing treatment for a concurrent stage IV melanoma.  EXAM: 2D DIGITAL DIAGNOSTIC BILATERAL MAMMOGRAM WITH CAD AND ADJUNCT TOMO  LEFT BREAST ULTRASOUND  COMPARISON:  Previous exam(s).  ACR Breast Density Category c: The breast tissue is heterogeneously dense, which may obscure small masses.  FINDINGS: The known cancer in the upper-inner quadrant of the left breast is again identified with a ribbon shaped biopsy marking clip centrally located. Mammographically, the mass appears smaller than on the June 06, 2015 exam. No new suspicious calcifications, masses or areas of distortion are seen in the bilateral breasts.  Mammographic images were processed with CAD.  Ultrasound targeted to the left breast at 11 o'clock, 4 cm from the nipple measures 2.1 x 1.2 x 1.9 cm, previously measuring 2.7 x 1.6 x 2.7 cm.  IMPRESSION: 1. Decrease in size of the known malignancy in the medial aspect of the left breast.  2.  No mammographic evidence of right breast malignancy.  RECOMMENDATION: Continued imaging follow-up as clinically indicated for the known left breast cancer.  I have discussed the findings and recommendations with the patient. Results were also provided in writing at the conclusion of the visit. If applicable, a reminder letter will be sent to the patient regarding the next appointment.  BI-RADS CATEGORY  6: Known biopsy-proven malignancy.   Electronically Signed   By:  Ammie Ferrier M.D.   On: 06/23/2016 12:44  ASSESSMENT: 83 y.o. [place] woman with concurrent breast cancer and melanoma  BREAST CANCER: (1) left breast upper inner quadrant biopsy 06/06/2015 shows a clinical T2 NX, stage II invasive ductal carcinoma , grade 2, estrogen receptor and progesterone receptor strongly positive, with an MIB-1 of 5%, and no HER-2 amplification, the signals ratio being 1.42 and the number per cell 2.55.   (2) anastrozole started 07/01/2015  (a) bone density February 2017 shows osteoporosis with a T score of -2.5  (b) mammography and ultrasonography 06/23/2016 shows continuing response  MALIGNANT MELANOMA: (a) shave biopsy from the mid upper back 04/17/2015 shows invasive malignant melanoma, Breslow depth 6.5 mm plus, Clark's level 4+, with 23 mitoses per square millimeter, extensive ulceration,, with involved edges, and non-brisk host response  (i) biopsy of right and left axillary lymph nodes 06/06/2015 showed metastatic melanoma, BRAF wild type  (ii) PET scan 07/01/2015 shows bilateral axillary adenopathy, a left latissimus mass, a left pararenal mass, and 2 liver lesions  (iii) PET scan 12/06/2017shows continuing improvement in hypermetabolic lesions   (b) pembrolizumab started 07/16/2015, repeated every 21 days  (i) increased TSH noted June 2018, corrected on Synthroid   PLAN: Searra is now a little over one year out from definitive diagnosis of metastatic melanoma, with no clinical evidence of active disease. This is very favorable.  She continues to tolerate the pembrolizumab without any obvious side effects. We have corrected the hypothyroidism and her most recent TSH was in the therapeutic range.  As far as her breast cancer is concerned, there was some increased activity on the PET scan obtained in December, but the January mammography and ultrasonography were very reassuring.  Accordingly the plan is to continue anastrozole, postponing surgery on  until such time as there is evidence of disease progression. We are also continuing the pembrolizumab.  I think the occasional dizzy  spells that she has may be related to her blood pressure. We're going to drop the lisinopril to 10 mg daily. I put in that prescription for her.  She will have her next scan early June and she will see me later that month assuming continuing response the plan is to keep the femoral is about every 3 week schedule through the rest of this year at least  She knows to call for any problems that may develop before her next visit.  Chauncey Cruel, MD   08/12/2016 11:02 AM

## 2016-08-22 ENCOUNTER — Other Ambulatory Visit: Payer: Self-pay | Admitting: Oncology

## 2016-09-02 ENCOUNTER — Ambulatory Visit: Payer: Medicare HMO

## 2016-09-02 ENCOUNTER — Other Ambulatory Visit: Payer: Medicare HMO

## 2016-09-02 ENCOUNTER — Other Ambulatory Visit (HOSPITAL_BASED_OUTPATIENT_CLINIC_OR_DEPARTMENT_OTHER): Payer: Medicare HMO

## 2016-09-02 ENCOUNTER — Ambulatory Visit (HOSPITAL_BASED_OUTPATIENT_CLINIC_OR_DEPARTMENT_OTHER): Payer: Medicare HMO

## 2016-09-02 VITALS — BP 117/51 | HR 67 | Temp 98.6°F | Resp 17

## 2016-09-02 DIAGNOSIS — C439 Malignant melanoma of skin, unspecified: Secondary | ICD-10-CM

## 2016-09-02 DIAGNOSIS — C787 Secondary malignant neoplasm of liver and intrahepatic bile duct: Secondary | ICD-10-CM

## 2016-09-02 DIAGNOSIS — Z5112 Encounter for antineoplastic immunotherapy: Secondary | ICD-10-CM | POA: Diagnosis not present

## 2016-09-02 DIAGNOSIS — Z79899 Other long term (current) drug therapy: Secondary | ICD-10-CM | POA: Diagnosis not present

## 2016-09-02 DIAGNOSIS — C438 Malignant melanoma of overlapping sites of skin: Secondary | ICD-10-CM

## 2016-09-02 DIAGNOSIS — C4359 Malignant melanoma of other part of trunk: Secondary | ICD-10-CM

## 2016-09-02 DIAGNOSIS — C50212 Malignant neoplasm of upper-inner quadrant of left female breast: Secondary | ICD-10-CM

## 2016-09-02 DIAGNOSIS — Z170 Estrogen receptor positive status [ER+]: Secondary | ICD-10-CM

## 2016-09-02 LAB — COMPREHENSIVE METABOLIC PANEL
ALBUMIN: 3.8 g/dL (ref 3.5–5.0)
ALT: 14 U/L (ref 0–55)
AST: 22 U/L (ref 5–34)
Alkaline Phosphatase: 64 U/L (ref 40–150)
Anion Gap: 11 mEq/L (ref 3–11)
BILIRUBIN TOTAL: 0.34 mg/dL (ref 0.20–1.20)
BUN: 24.1 mg/dL (ref 7.0–26.0)
CO2: 24 meq/L (ref 22–29)
CREATININE: 0.8 mg/dL (ref 0.6–1.1)
Calcium: 10.4 mg/dL (ref 8.4–10.4)
Chloride: 104 mEq/L (ref 98–109)
EGFR: 71 mL/min/{1.73_m2} — ABNORMAL LOW (ref >90)
GLUCOSE: 113 mg/dL (ref 70–140)
Potassium: 4.2 mEq/L (ref 3.5–5.1)
SODIUM: 138 meq/L (ref 136–145)
TOTAL PROTEIN: 7.1 g/dL (ref 6.4–8.3)

## 2016-09-02 LAB — CBC WITH DIFFERENTIAL/PLATELET
BASO%: 1 % (ref 0.0–2.0)
Basophils Absolute: 0.1 10*3/uL (ref 0.0–0.1)
EOS%: 0 % (ref 0.0–7.0)
Eosinophils Absolute: 0 10*3/uL (ref 0.0–0.5)
HCT: 38.7 % (ref 34.8–46.6)
HGB: 12.9 g/dL (ref 11.6–15.9)
LYMPH%: 9.4 % — AB (ref 14.0–49.7)
MCH: 27.3 pg (ref 25.1–34.0)
MCHC: 33.3 g/dL (ref 31.5–36.0)
MCV: 82.1 fL (ref 79.5–101.0)
MONO#: 0.6 10*3/uL (ref 0.1–0.9)
MONO%: 9.1 % (ref 0.0–14.0)
NEUT%: 80.5 % — ABNORMAL HIGH (ref 38.4–76.8)
NEUTROS ABS: 5.5 10*3/uL (ref 1.5–6.5)
Platelets: 314 10*3/uL (ref 145–400)
RBC: 4.72 10*6/uL (ref 3.70–5.45)
RDW: 13.5 % (ref 11.2–14.5)
WBC: 6.9 10*3/uL (ref 3.9–10.3)
lymph#: 0.6 10*3/uL — ABNORMAL LOW (ref 0.9–3.3)

## 2016-09-02 LAB — TSH: TSH: 1.6 m(IU)/L (ref 0.308–3.960)

## 2016-09-02 LAB — LACTATE DEHYDROGENASE: LDH: 184 U/L (ref 125–245)

## 2016-09-02 MED ORDER — SODIUM CHLORIDE 0.9 % IV SOLN
Freq: Once | INTRAVENOUS | Status: AC
  Administered 2016-09-02: 12:00:00 via INTRAVENOUS

## 2016-09-02 MED ORDER — PEMBROLIZUMAB CHEMO INJECTION 100 MG/4ML
2.0000 mg/kg | Freq: Once | INTRAVENOUS | Status: AC
  Administered 2016-09-02: 100 mg via INTRAVENOUS
  Filled 2016-09-02: qty 4

## 2016-09-02 NOTE — Patient Instructions (Signed)
[facility] Discharge Instructions for Patients Receiving Chemotherapy  Today you received the following chemotherapy agents:  Keytruda.  To help prevent nausea and vomiting after your treatment, we encourage you to take your nausea medication as prescribed.   If you develop nausea and vomiting that is not controlled by your nausea medication, call the clinic.   BELOW ARE SYMPTOMS THAT SHOULD BE REPORTED IMMEDIATELY:  *FEVER GREATER THAN 100.5 F  *CHILLS WITH OR WITHOUT FEVER  NAUSEA AND VOMITING THAT IS NOT CONTROLLED WITH YOUR NAUSEA MEDICATION  *UNUSUAL SHORTNESS OF BREATH  *UNUSUAL BRUISING OR BLEEDING  TENDERNESS IN MOUTH AND THROAT WITH OR WITHOUT PRESENCE OF ULCERS  *URINARY PROBLEMS  *BOWEL PROBLEMS  UNUSUAL RASH Items with * indicate a potential emergency and should be followed up as soon as possible.  Feel free to call the clinic you have any questions or concerns. The clinic phone number is [phone].  Please show the CHEMO ALERT CARD at check-in to the Emergency Department and triage nurse.   

## 2016-09-23 ENCOUNTER — Other Ambulatory Visit (HOSPITAL_BASED_OUTPATIENT_CLINIC_OR_DEPARTMENT_OTHER): Payer: Medicare HMO

## 2016-09-23 ENCOUNTER — Ambulatory Visit (HOSPITAL_BASED_OUTPATIENT_CLINIC_OR_DEPARTMENT_OTHER): Payer: Medicare HMO

## 2016-09-23 VITALS — BP 106/63 | HR 63 | Temp 98.2°F | Resp 16

## 2016-09-23 DIAGNOSIS — C4359 Malignant melanoma of other part of trunk: Secondary | ICD-10-CM

## 2016-09-23 DIAGNOSIS — C773 Secondary and unspecified malignant neoplasm of axilla and upper limb lymph nodes: Secondary | ICD-10-CM | POA: Diagnosis not present

## 2016-09-23 DIAGNOSIS — C439 Malignant melanoma of skin, unspecified: Secondary | ICD-10-CM

## 2016-09-23 DIAGNOSIS — C438 Malignant melanoma of overlapping sites of skin: Secondary | ICD-10-CM

## 2016-09-23 DIAGNOSIS — Z5112 Encounter for antineoplastic immunotherapy: Secondary | ICD-10-CM

## 2016-09-23 LAB — CBC WITH DIFFERENTIAL/PLATELET
BASO%: 0.7 % (ref 0.0–2.0)
BASOS ABS: 0.1 10*3/uL (ref 0.0–0.1)
EOS ABS: 0 10*3/uL (ref 0.0–0.5)
EOS%: 0 % (ref 0.0–7.0)
HCT: 39.6 % (ref 34.8–46.6)
HEMOGLOBIN: 13 g/dL (ref 11.6–15.9)
LYMPH#: 0.7 10*3/uL — AB (ref 0.9–3.3)
LYMPH%: 10 % — ABNORMAL LOW (ref 14.0–49.7)
MCH: 27.2 pg (ref 25.1–34.0)
MCHC: 32.8 g/dL (ref 31.5–36.0)
MCV: 82.8 fL (ref 79.5–101.0)
MONO#: 0.9 10*3/uL (ref 0.1–0.9)
MONO%: 12 % (ref 0.0–14.0)
NEUT#: 5.6 10*3/uL (ref 1.5–6.5)
NEUT%: 77.3 % — ABNORMAL HIGH (ref 38.4–76.8)
NRBC: 0 % (ref 0–0)
PLATELETS: 296 10*3/uL (ref 145–400)
RBC: 4.78 10*6/uL (ref 3.70–5.45)
RDW: 13.5 % (ref 11.2–14.5)
WBC: 7.3 10*3/uL (ref 3.9–10.3)

## 2016-09-23 LAB — COMPREHENSIVE METABOLIC PANEL
ALT: 14 U/L (ref 0–55)
ANION GAP: 11 meq/L (ref 3–11)
AST: 22 U/L (ref 5–34)
Albumin: 3.9 g/dL (ref 3.5–5.0)
Alkaline Phosphatase: 70 U/L (ref 40–150)
BUN: 20.9 mg/dL (ref 7.0–26.0)
CHLORIDE: 103 meq/L (ref 98–109)
CO2: 24 meq/L (ref 22–29)
CREATININE: 0.8 mg/dL (ref 0.6–1.1)
Calcium: 10.9 mg/dL — ABNORMAL HIGH (ref 8.4–10.4)
EGFR: 70 mL/min/{1.73_m2} — ABNORMAL LOW (ref >90)
Glucose: 100 mg/dl (ref 70–140)
POTASSIUM: 4.4 meq/L (ref 3.5–5.1)
Sodium: 138 mEq/L (ref 136–145)
Total Bilirubin: 0.34 mg/dL (ref 0.20–1.20)
Total Protein: 7.2 g/dL (ref 6.4–8.3)

## 2016-09-23 MED ORDER — SODIUM CHLORIDE 0.9 % IV SOLN
Freq: Once | INTRAVENOUS | Status: AC
  Administered 2016-09-23: 11:00:00 via INTRAVENOUS

## 2016-09-23 MED ORDER — PEMBROLIZUMAB CHEMO INJECTION 100 MG/4ML
2.0000 mg/kg | Freq: Once | INTRAVENOUS | Status: AC
  Administered 2016-09-23: 100 mg via INTRAVENOUS
  Filled 2016-09-23: qty 4

## 2016-10-14 ENCOUNTER — Ambulatory Visit (HOSPITAL_BASED_OUTPATIENT_CLINIC_OR_DEPARTMENT_OTHER): Payer: Medicare HMO

## 2016-10-14 ENCOUNTER — Other Ambulatory Visit (HOSPITAL_BASED_OUTPATIENT_CLINIC_OR_DEPARTMENT_OTHER): Payer: Medicare HMO

## 2016-10-14 VITALS — BP 146/57 | HR 73 | Temp 98.0°F | Resp 17

## 2016-10-14 DIAGNOSIS — C50212 Malignant neoplasm of upper-inner quadrant of left female breast: Secondary | ICD-10-CM

## 2016-10-14 DIAGNOSIS — C438 Malignant melanoma of overlapping sites of skin: Secondary | ICD-10-CM

## 2016-10-14 DIAGNOSIS — C4359 Malignant melanoma of other part of trunk: Secondary | ICD-10-CM

## 2016-10-14 DIAGNOSIS — Z5112 Encounter for antineoplastic immunotherapy: Secondary | ICD-10-CM | POA: Diagnosis not present

## 2016-10-14 DIAGNOSIS — C439 Malignant melanoma of skin, unspecified: Secondary | ICD-10-CM

## 2016-10-14 DIAGNOSIS — Z79899 Other long term (current) drug therapy: Secondary | ICD-10-CM

## 2016-10-14 DIAGNOSIS — Z170 Estrogen receptor positive status [ER+]: Secondary | ICD-10-CM

## 2016-10-14 DIAGNOSIS — C773 Secondary and unspecified malignant neoplasm of axilla and upper limb lymph nodes: Secondary | ICD-10-CM

## 2016-10-14 DIAGNOSIS — C787 Secondary malignant neoplasm of liver and intrahepatic bile duct: Secondary | ICD-10-CM

## 2016-10-14 LAB — CBC WITH DIFFERENTIAL/PLATELET
BASO%: 0.7 % (ref 0.0–2.0)
Basophils Absolute: 0.1 10*3/uL (ref 0.0–0.1)
EOS ABS: 0 10*3/uL (ref 0.0–0.5)
EOS%: 0 % (ref 0.0–7.0)
HCT: 38.9 % (ref 34.8–46.6)
HGB: 12.9 g/dL (ref 11.6–15.9)
LYMPH%: 8.4 % — AB (ref 14.0–49.7)
MCH: 27.2 pg (ref 25.1–34.0)
MCHC: 33.1 g/dL (ref 31.5–36.0)
MCV: 82 fL (ref 79.5–101.0)
MONO#: 0.8 10*3/uL (ref 0.1–0.9)
MONO%: 7.6 % (ref 0.0–14.0)
NEUT%: 83.3 % — ABNORMAL HIGH (ref 38.4–76.8)
NEUTROS ABS: 8.7 10*3/uL — AB (ref 1.5–6.5)
Platelets: 309 10*3/uL (ref 145–400)
RBC: 4.75 10*6/uL (ref 3.70–5.45)
RDW: 14 % (ref 11.2–14.5)
WBC: 10.5 10*3/uL — AB (ref 3.9–10.3)
lymph#: 0.9 10*3/uL (ref 0.9–3.3)

## 2016-10-14 LAB — COMPREHENSIVE METABOLIC PANEL
ALT: 16 U/L (ref 0–55)
ANION GAP: 10 meq/L (ref 3–11)
AST: 28 U/L (ref 5–34)
Albumin: 4 g/dL (ref 3.5–5.0)
Alkaline Phosphatase: 64 U/L (ref 40–150)
BUN: 22.2 mg/dL (ref 7.0–26.0)
CALCIUM: 10.2 mg/dL (ref 8.4–10.4)
CHLORIDE: 104 meq/L (ref 98–109)
CO2: 25 meq/L (ref 22–29)
CREATININE: 0.8 mg/dL (ref 0.6–1.1)
EGFR: 65 mL/min/{1.73_m2} — ABNORMAL LOW (ref >90)
Glucose: 114 mg/dl (ref 70–140)
POTASSIUM: 3.9 meq/L (ref 3.5–5.1)
SODIUM: 140 meq/L (ref 136–145)
Total Bilirubin: 0.42 mg/dL (ref 0.20–1.20)
Total Protein: 7.4 g/dL (ref 6.4–8.3)

## 2016-10-14 MED ORDER — SODIUM CHLORIDE 0.9 % IV SOLN
Freq: Once | INTRAVENOUS | Status: AC
  Administered 2016-10-14: 15:00:00 via INTRAVENOUS

## 2016-10-14 MED ORDER — SODIUM CHLORIDE 0.9 % IV SOLN
2.0000 mg/kg | Freq: Once | INTRAVENOUS | Status: AC
  Administered 2016-10-14: 100 mg via INTRAVENOUS
  Filled 2016-10-14: qty 4

## 2016-10-14 NOTE — Patient Instructions (Signed)
[facility] Discharge Instructions for Patients Receiving Chemotherapy  Today you received the following chemotherapy agents: Keytruda   To help prevent nausea and vomiting after your treatment, we encourage you to take your nausea medication as directed    If you develop nausea and vomiting that is not controlled by your nausea medication, call the clinic.   BELOW ARE SYMPTOMS THAT SHOULD BE REPORTED IMMEDIATELY:  *FEVER GREATER THAN 100.5 F  *CHILLS WITH OR WITHOUT FEVER  NAUSEA AND VOMITING THAT IS NOT CONTROLLED WITH YOUR NAUSEA MEDICATION  *UNUSUAL SHORTNESS OF BREATH  *UNUSUAL BRUISING OR BLEEDING  TENDERNESS IN MOUTH AND THROAT WITH OR WITHOUT PRESENCE OF ULCERS  *URINARY PROBLEMS  *BOWEL PROBLEMS  UNUSUAL RASH Items with * indicate a potential emergency and should be followed up as soon as possible.  Feel free to call the clinic you have any questions or concerns. The clinic phone number is [phone].  Please show the CHEMO ALERT CARD at check-in to the Emergency Department and triage nurse.   

## 2016-10-15 LAB — TSH: TSH: 2.253 m(IU)/L (ref 0.308–3.960)

## 2016-10-15 LAB — LACTATE DEHYDROGENASE: LDH: 266 U/L — ABNORMAL HIGH (ref 125–245)

## 2016-11-04 ENCOUNTER — Ambulatory Visit (HOSPITAL_BASED_OUTPATIENT_CLINIC_OR_DEPARTMENT_OTHER): Payer: Medicare HMO

## 2016-11-04 ENCOUNTER — Other Ambulatory Visit (HOSPITAL_BASED_OUTPATIENT_CLINIC_OR_DEPARTMENT_OTHER): Payer: Medicare HMO

## 2016-11-04 VITALS — BP 132/54 | HR 69 | Temp 97.9°F | Resp 18

## 2016-11-04 DIAGNOSIS — C438 Malignant melanoma of overlapping sites of skin: Secondary | ICD-10-CM

## 2016-11-04 DIAGNOSIS — Z5112 Encounter for antineoplastic immunotherapy: Secondary | ICD-10-CM | POA: Diagnosis not present

## 2016-11-04 DIAGNOSIS — C4359 Malignant melanoma of other part of trunk: Secondary | ICD-10-CM | POA: Diagnosis not present

## 2016-11-04 DIAGNOSIS — C439 Malignant melanoma of skin, unspecified: Secondary | ICD-10-CM

## 2016-11-04 LAB — CBC WITH DIFFERENTIAL/PLATELET
BASO%: 0.7 % (ref 0.0–2.0)
Basophils Absolute: 0.1 10*3/uL (ref 0.0–0.1)
EOS%: 0 % (ref 0.0–7.0)
Eosinophils Absolute: 0 10*3/uL (ref 0.0–0.5)
HCT: 38.2 % (ref 34.8–46.6)
HEMOGLOBIN: 12.7 g/dL (ref 11.6–15.9)
LYMPH%: 8.8 % — ABNORMAL LOW (ref 14.0–49.7)
MCH: 27.5 pg (ref 25.1–34.0)
MCHC: 33.2 g/dL (ref 31.5–36.0)
MCV: 83 fL (ref 79.5–101.0)
MONO#: 0.9 10*3/uL (ref 0.1–0.9)
MONO%: 10.1 % (ref 0.0–14.0)
NEUT#: 6.8 10*3/uL — ABNORMAL HIGH (ref 1.5–6.5)
NEUT%: 80.4 % — ABNORMAL HIGH (ref 38.4–76.8)
Platelets: 281 10*3/uL (ref 145–400)
RBC: 4.59 10*6/uL (ref 3.70–5.45)
RDW: 13.9 % (ref 11.2–14.5)
WBC: 8.5 10*3/uL (ref 3.9–10.3)
lymph#: 0.7 10*3/uL — ABNORMAL LOW (ref 0.9–3.3)

## 2016-11-04 LAB — COMPREHENSIVE METABOLIC PANEL
ALT: 11 U/L (ref 0–55)
ANION GAP: 9 meq/L (ref 3–11)
AST: 22 U/L (ref 5–34)
Albumin: 3.8 g/dL (ref 3.5–5.0)
Alkaline Phosphatase: 60 U/L (ref 40–150)
BILIRUBIN TOTAL: 0.4 mg/dL (ref 0.20–1.20)
BUN: 29.4 mg/dL — ABNORMAL HIGH (ref 7.0–26.0)
CALCIUM: 10 mg/dL (ref 8.4–10.4)
CO2: 25 mEq/L (ref 22–29)
CREATININE: 0.8 mg/dL (ref 0.6–1.1)
Chloride: 104 mEq/L (ref 98–109)
EGFR: 66 mL/min/{1.73_m2} — ABNORMAL LOW (ref >90)
Glucose: 98 mg/dl (ref 70–140)
Potassium: 4.3 mEq/L (ref 3.5–5.1)
Sodium: 138 mEq/L (ref 136–145)
Total Protein: 6.9 g/dL (ref 6.4–8.3)

## 2016-11-04 MED ORDER — SODIUM CHLORIDE 0.9 % IV SOLN
2.0000 mg/kg | Freq: Once | INTRAVENOUS | Status: AC
  Administered 2016-11-04: 100 mg via INTRAVENOUS
  Filled 2016-11-04: qty 4

## 2016-11-04 MED ORDER — SODIUM CHLORIDE 0.9 % IV SOLN
Freq: Once | INTRAVENOUS | Status: AC
  Administered 2016-11-04: 11:00:00 via INTRAVENOUS

## 2016-11-04 NOTE — Patient Instructions (Signed)
[facility] Discharge Instructions for Patients Receiving Chemotherapy  Today you received the following chemotherapy agents:  Keytruda.  To help prevent nausea and vomiting after your treatment, we encourage you to take your nausea medication as prescribed.   If you develop nausea and vomiting that is not controlled by your nausea medication, call the clinic.   BELOW ARE SYMPTOMS THAT SHOULD BE REPORTED IMMEDIATELY:  *FEVER GREATER THAN 100.5 F  *CHILLS WITH OR WITHOUT FEVER  NAUSEA AND VOMITING THAT IS NOT CONTROLLED WITH YOUR NAUSEA MEDICATION  *UNUSUAL SHORTNESS OF BREATH  *UNUSUAL BRUISING OR BLEEDING  TENDERNESS IN MOUTH AND THROAT WITH OR WITHOUT PRESENCE OF ULCERS  *URINARY PROBLEMS  *BOWEL PROBLEMS  UNUSUAL RASH Items with * indicate a potential emergency and should be followed up as soon as possible.  Feel free to call the clinic you have any questions or concerns. The clinic phone number is [phone].  Please show the CHEMO ALERT CARD at check-in to the Emergency Department and triage nurse.   

## 2016-11-13 ENCOUNTER — Telehealth: Payer: Self-pay

## 2016-11-13 DIAGNOSIS — C438 Malignant melanoma of overlapping sites of skin: Secondary | ICD-10-CM

## 2016-11-13 DIAGNOSIS — C50212 Malignant neoplasm of upper-inner quadrant of left female breast: Secondary | ICD-10-CM

## 2016-11-13 DIAGNOSIS — C787 Secondary malignant neoplasm of liver and intrahepatic bile duct: Secondary | ICD-10-CM

## 2016-11-13 NOTE — Telephone Encounter (Signed)
Pt is for PET Wednesday. Radiology is asking the order be changed from PET skull base to thigh to restage whole body PET. OK per Dr Jana Hakim. Done. inbasket to nicole for PA

## 2016-11-15 ENCOUNTER — Other Ambulatory Visit: Payer: Self-pay | Admitting: Oncology

## 2016-11-15 DIAGNOSIS — C438 Malignant melanoma of overlapping sites of skin: Secondary | ICD-10-CM

## 2016-11-18 ENCOUNTER — Encounter (HOSPITAL_COMMUNITY)
Admission: RE | Admit: 2016-11-18 | Discharge: 2016-11-18 | Disposition: A | Discharge status: Home / Self Care | Payer: Medicare HMO | Source: Ambulatory Visit | Attending: Oncology | Admitting: Oncology

## 2016-11-18 ENCOUNTER — Encounter (HOSPITAL_COMMUNITY): Payer: Medicare HMO

## 2016-11-18 DIAGNOSIS — C50212 Malignant neoplasm of upper-inner quadrant of left female breast: Secondary | ICD-10-CM | POA: Insufficient documentation

## 2016-11-18 DIAGNOSIS — C438 Malignant melanoma of overlapping sites of skin: Secondary | ICD-10-CM | POA: Insufficient documentation

## 2016-11-18 DIAGNOSIS — C787 Secondary malignant neoplasm of liver and intrahepatic bile duct: Secondary | ICD-10-CM

## 2016-11-18 LAB — GLUCOSE, CAPILLARY: Glucose-Capillary: 99 mg/dL (ref 65–99)

## 2016-11-18 MED ORDER — FLUDEOXYGLUCOSE F - 18 (FDG) INJECTION
5.8000 | Freq: Once | INTRAVENOUS | Status: AC | PRN
  Administered 2016-11-18: 5.8 via INTRAVENOUS

## 2016-11-25 ENCOUNTER — Ambulatory Visit (HOSPITAL_BASED_OUTPATIENT_CLINIC_OR_DEPARTMENT_OTHER): Payer: Medicare HMO | Admitting: Oncology

## 2016-11-25 ENCOUNTER — Ambulatory Visit (HOSPITAL_BASED_OUTPATIENT_CLINIC_OR_DEPARTMENT_OTHER): Payer: Medicare HMO

## 2016-11-25 ENCOUNTER — Other Ambulatory Visit (HOSPITAL_BASED_OUTPATIENT_CLINIC_OR_DEPARTMENT_OTHER): Payer: Medicare HMO

## 2016-11-25 VITALS — BP 136/50 | HR 75 | Temp 97.5°F | Resp 18 | Ht 64.0 in | Wt 118.1 lb

## 2016-11-25 DIAGNOSIS — Z79811 Long term (current) use of aromatase inhibitors: Secondary | ICD-10-CM

## 2016-11-25 DIAGNOSIS — M810 Age-related osteoporosis without current pathological fracture: Secondary | ICD-10-CM

## 2016-11-25 DIAGNOSIS — C773 Secondary and unspecified malignant neoplasm of axilla and upper limb lymph nodes: Secondary | ICD-10-CM

## 2016-11-25 DIAGNOSIS — C439 Malignant melanoma of skin, unspecified: Secondary | ICD-10-CM

## 2016-11-25 DIAGNOSIS — Z170 Estrogen receptor positive status [ER+]: Secondary | ICD-10-CM

## 2016-11-25 DIAGNOSIS — C438 Malignant melanoma of overlapping sites of skin: Secondary | ICD-10-CM

## 2016-11-25 DIAGNOSIS — Z79899 Other long term (current) drug therapy: Secondary | ICD-10-CM

## 2016-11-25 DIAGNOSIS — C4359 Malignant melanoma of other part of trunk: Secondary | ICD-10-CM

## 2016-11-25 DIAGNOSIS — Z5112 Encounter for antineoplastic immunotherapy: Secondary | ICD-10-CM | POA: Diagnosis not present

## 2016-11-25 DIAGNOSIS — C787 Secondary malignant neoplasm of liver and intrahepatic bile duct: Secondary | ICD-10-CM

## 2016-11-25 DIAGNOSIS — C50212 Malignant neoplasm of upper-inner quadrant of left female breast: Secondary | ICD-10-CM

## 2016-11-25 DIAGNOSIS — M8000XD Age-related osteoporosis with current pathological fracture, unspecified site, subsequent encounter for fracture with routine healing: Secondary | ICD-10-CM

## 2016-11-25 LAB — COMPREHENSIVE METABOLIC PANEL
ALBUMIN: 3.8 g/dL (ref 3.5–5.0)
ALT: 13 U/L (ref 0–55)
AST: 23 U/L (ref 5–34)
Alkaline Phosphatase: 61 U/L (ref 40–150)
Anion Gap: 11 mEq/L (ref 3–11)
BUN: 21.9 mg/dL (ref 7.0–26.0)
CO2: 25 mEq/L (ref 22–29)
CREATININE: 0.8 mg/dL (ref 0.6–1.1)
Calcium: 10.2 mg/dL (ref 8.4–10.4)
Chloride: 103 mEq/L (ref 98–109)
EGFR: 66 mL/min/{1.73_m2} — ABNORMAL LOW (ref >90)
Glucose: 96 mg/dl (ref 70–140)
POTASSIUM: 4.5 meq/L (ref 3.5–5.1)
SODIUM: 140 meq/L (ref 136–145)
TOTAL PROTEIN: 7 g/dL (ref 6.4–8.3)
Total Bilirubin: 0.32 mg/dL (ref 0.20–1.20)

## 2016-11-25 LAB — CBC WITH DIFFERENTIAL/PLATELET
BASO%: 0.4 % (ref 0.0–2.0)
Basophils Absolute: 0 10*3/uL (ref 0.0–0.1)
EOS%: 0 % (ref 0.0–7.0)
Eosinophils Absolute: 0 10*3/uL (ref 0.0–0.5)
HCT: 39.3 % (ref 34.8–46.6)
HEMOGLOBIN: 12.8 g/dL (ref 11.6–15.9)
LYMPH%: 9.2 % — ABNORMAL LOW (ref 14.0–49.7)
MCH: 27.8 pg (ref 25.1–34.0)
MCHC: 32.6 g/dL (ref 31.5–36.0)
MCV: 85.4 fL (ref 79.5–101.0)
MONO#: 0.7 10*3/uL (ref 0.1–0.9)
MONO%: 9.7 % (ref 0.0–14.0)
NEUT%: 80.7 % — ABNORMAL HIGH (ref 38.4–76.8)
NEUTROS ABS: 6.1 10*3/uL (ref 1.5–6.5)
Platelets: 264 10*3/uL (ref 145–400)
RBC: 4.6 10*6/uL (ref 3.70–5.45)
RDW: 13.4 % (ref 11.2–14.5)
WBC: 7.6 10*3/uL (ref 3.9–10.3)
lymph#: 0.7 10*3/uL — ABNORMAL LOW (ref 0.9–3.3)

## 2016-11-25 LAB — LACTATE DEHYDROGENASE: LDH: 224 U/L (ref 125–245)

## 2016-11-25 LAB — TSH: TSH: 1.072 m(IU)/L (ref 0.308–3.960)

## 2016-11-25 MED ORDER — SODIUM CHLORIDE 0.9 % IV SOLN
2.0000 mg/kg | Freq: Once | INTRAVENOUS | Status: AC
  Administered 2016-11-25: 100 mg via INTRAVENOUS
  Filled 2016-11-25: qty 4

## 2016-11-25 MED ORDER — SODIUM CHLORIDE 0.9 % IV SOLN
Freq: Once | INTRAVENOUS | Status: AC
  Administered 2016-11-25: 12:00:00 via INTRAVENOUS

## 2016-11-25 MED ORDER — VITAMIN D 1000 UNITS PO TABS: 1000.0000 [IU] | ORAL_TABLET | Freq: Every day | ORAL | 4 refills | Status: DC

## 2016-11-25 NOTE — Progress Notes (Signed)
Danielle [name]  [phone] [phone]     ID: Danielle [name] DOB: 1926-04-25  MR#: [id number]  GUR#:[id number]  Patient Care Team: Bartholome Bill, MD as PCP - General (Family Medicine) Armandina Gemma, MD as Consulting Physician (General Surgery) Daevion Navarette, Virgie Dad, MD as Consulting Physician (Oncology) Allyn Kenner, MD (Dermatology) Gaynelle Arabian, MD as Consulting Physician (Orthopedic Surgery) PCP: Bartholome Bill, MD GYN: OTHER MD:  CHIEF COMPLAINT: Estrogen receptor positive breast cancer; stage IV malignant melanoma  CURRENT TREATMENT: Anastrozole, pembrolizumab   BREAST CANCER HISTORY: From the original intake note:  [name] she had an itchy spot in the middle of her back which she scratched sometimes, but this was not evaluated until [facility] fell off a chair while changing a light bulb and broke her left upper extremity. Because she couldn't change while in a sling, her daughter Danielle [name] had 2 give her a hand and in the process noted a black lesion in the middle of Danielle [name]'s back. She took married to Dr. [name] office where on 04/17/2015 he performed a shave biopsy of a pigmented lesion over the spine in the upper back. The pathology report ([id number]) showed a breast low depth of at least 6.5 mm, with the anatomic level at least 4. There were 23 mitoses per square millimeter and extensive ulceration. There was no satellitosis. There was no evidence of regression. They host response was not brisk. This was read as a pathology stage T4b and the patient was referred to [name] for wide excision.  However on exam Dr. jerking noted bilateral axillary adenopathy and also a mass in the upper inner quadrant of the left breast. He set [name] up for bilateral mammography with tomography 06/06/2015, and this showed an irregular hyperdense mass in the left breast upper inner quadrant which was palpable. There was also a firm palpable mass in the left axilla as well as  in the contralateral, right axilla. Ultrasonography showed the left breast mass to measure 2.7 cm and at least 2 abnormally enlarged lymph nodes in the left axilla the larger one measuring 3.9 cm. In the right axilla there were also 2 abnormal appearing lymph nodes, the larger measuring 2.6 cm.  On 06/06/2015 [name] underwent biopsy of the left breast mass and both axillary adenopathy areas. The left breast mass proved to be an invasive ductal carcinoma , grade 2, estrogen and progesterone receptor positive, both at 100%, with strong staining intensity. HER-2 was not amplified, with a signals ratio of 1.42, the average copy number per cell being 2.55. The proliferation marker was 5%.  More importantly, both axillary lymph nodes showed metastatic melanoma. We were contacted and prior to today's visit we obtained a PET scan, which shows in addition to the left breast mass and the bilateral axillary lymph nodes (described as just deep to the latissimus dorsi muscles bilaterally) 2 hypermetabolic foci in the left lobe of the liver, measuring 1.3 and 0.8 cm, with an SUV max of 6.3. A 1.5 cm mass next to the left kidney, clearly separate from the adrenal gland, is also hypermetabolic with an SUV max of 8.2. There were no lung or bone lesions.  Bahar subsequent history is as detailed below   INTERVAL HISTORY: Danielle [name] returns today for follow-up of both her metastatic malignant melanoma and her breast cancer, recalling that she never did have a lumpectomy. As far as the melanoma is concerned she continues on pembrolizumab every 3 weeks. She tolerates this well. In fact she reports no  side effects related to the treatments.  As far as the breast cancer is concerned, she continues on anastrozole, with excellent tolerance. She obtains it at a very good price. We obtained an ultrasound of the breast in January of this year which showed evidence of response. We will be repeating that again early in 2019.  We just obtained  a restaging PET scan to assess response of the melanoma in particular and it shows no clear evidence of disease progression. The axillary lymph nodes in particular have a slightly decreased SUV reading each. There is more uptake around the area of the left humeral fracture (which occurred September 2016). The soft tissue component however is decreased.  REVIEW OF SYSTEMS: Danielle [name] mostly stays around the house. She watches the [employer] because "those stories have good endings". She does a lot of reading. There have been no recent falls, unusual headaches, visual changes, nausea, or vomiting. Her dizziness resolved when we dropped her Vasotec dose. According to her daughter the patient has an excellent quality of life and many friends and people who care for her. The patient apparently has been withdrawing slightly recently. She has no complaints however and a detailed review of systems today was entirely stable  PAST MEDICAL HISTORY: No past medical history on file. Remote history of rectal prolapse with chronic constipation resulting, history of left humeral fracture, osteopenia  PAST SURGICAL HISTORY: Past Surgical History:  Procedure Laterality Date  . HIP SURGERY      FAMILY HISTORY No family history on file. The patient's father died at the age of 38 in the setting of a call abuse. The patient's mother died from "old age" at 19. The patient had 4 brothers, no sisters. One brother died in his 97s from brain cancer. Another died from a stroke, and other from emphysema. The 73, youngest brother is 29 and jogs every day. There is no history of breast or ovarian cancer in the family. There is also no prior history of melanoma.  GYNECOLOGIC HISTORY:  No LMP recorded. Patient is postmenopausal. Menarche age 46, first live birth age 41, the patient is [place] P4. She stopped having periods in her early 68s. She did not use hormone replacement. She never took oral contraceptives.  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 She is widowed. She is now retired, and lives alone, with no pets. Her son [name] lives in [place], and is a retired Optometrist. Son [name] lives in [place] and is a Chief of Staff. Daughter Danielle [name] is a Marine scientist at [employer] locally. The fourth child, [name], died in an automobile accident at age 52. The patient has 4 grandchildren, no great grandchildren. She is a Corporate treasurer. She attends services at [employer].    ADVANCED DIRECTIVES: Not yet notarized; Gene intends to name her daughter Danielle [name] as her healthcare power of attorney. She can be reached at [phone]   HEALTH MAINTENANCE: Social History  Substance Use Topics  . Smoking status: Never Smoker  . Smokeless tobacco: Not on file  . Alcohol use No     Colonoscopy: Remote  PAP:  Bone density: Remote  Lipid panel:  Allergies  Allergen Reactions  . Vicodin [Hydrocodone-Acetaminophen] Nausea Only    Current Outpatient Prescriptions  Medication Sig Dispense Refill  . anastrozole (ARIMIDEX) 1 MG tablet TAKE ONE TABLET (1 MG) BY MOUTH DAILY. 90 tablet 3  . aspirin EC 81 MG tablet  Take by mouth.    . cholecalciferol (VITAMIN D) 1000 units tablet Take 1 tablet (1,000 Units total) by mouth daily. 100 tablet 4  . enalapril (VASOTEC) 10 MG tablet Take 1 tablet (10 mg total) by mouth daily. 90 tablet 4  . levothyroxine (SYNTHROID, LEVOTHROID) 100 MCG tablet TAKE ONE TABLET BY MOUTH DAILY BEFORE BREAKFAST 30 tablet 11  . Lutein 20 MG CAPS Take by mouth.    . Red Yeast Rice Extract (RED YEAST RICE PO) Take by mouth.    . triamterene-hydrochlorothiazide (DYAZIDE) 37.5-25 MG capsule Take 1 capsule by mouth daily.     . verapamil (CALAN-SR) 240 MG CR tablet   0   No current facility-administered medications for this visit.     OBJECTIVE: Older white woman Who appears stated age  20:   11/25/16 1129  BP: (!)  136/50  Pulse: 75  Resp: 18  Temp: 97.5 F (36.4 C)     Body mass index is 20.27 kg/m.    ECOG FS:1 - Symptomatic but completely ambulatory  Sclerae unicteric, pupils round and equal Oropharynx clear and moist No cervical or supraclavicular adenopathy Lungs no rales or rhonchi Heart regular rate and rhythm Abd soft, nontender, positive bowel sounds MSK no focal spinal tenderness, no upper extremity lymphedema Neuro: nonfocal, well oriented, Positive affect Breasts: The right breast is benign. I do not palpate a well-defined mass in the left breast. There are no skin or nipple changes of concern. Both axillae are benign.  LAB RESULTS:  CMP     Component Value Date/Time   NA 140 11/25/2016 1107   K 4.5 11/25/2016 1107   CL 100 07/30/2006 1318   CO2 25 11/25/2016 1107   GLUCOSE 96 11/25/2016 1107   BUN 21.9 11/25/2016 1107   CREATININE 0.8 11/25/2016 1107   CALCIUM 10.2 11/25/2016 1107   PROT 7.0 11/25/2016 1107   ALBUMIN 3.8 11/25/2016 1107   AST 23 11/25/2016 1107   ALT 13 11/25/2016 1107   ALKPHOS 61 11/25/2016 1107   BILITOT 0.32 11/25/2016 1107   GFRNONAA 86 07/30/2006 1318   GFRAA 104 07/30/2006 1318    INo results found for: SPEP, UPEP  Lab Results  Component Value Date   WBC 7.6 11/25/2016   NEUTROABS 6.1 11/25/2016   HGB 12.8 11/25/2016   HCT 39.3 11/25/2016   MCV 85.4 11/25/2016   PLT 264 11/25/2016      Chemistry      Component Value Date/Time   NA 140 11/25/2016 1107   K 4.5 11/25/2016 1107   CL 100 07/30/2006 1318   CO2 25 11/25/2016 1107   BUN 21.9 11/25/2016 1107   CREATININE 0.8 11/25/2016 1107      Component Value Date/Time   CALCIUM 10.2 11/25/2016 1107   ALKPHOS 61 11/25/2016 1107   AST 23 11/25/2016 1107   ALT 13 11/25/2016 1107   BILITOT 0.32 11/25/2016 1107       No results found for: LABCA2  No components found for: LABCA125  No results for input(s): INR in the last 168 hours.  Urinalysis No results found for:  COLORURINE, APPEARANCEUR, LABSPEC, PHURINE, GLUCOSEU, HGBUR, BILIRUBINUR, KETONESUR, PROTEINUR, UROBILINOGEN, NITRITE, LEUKOCYTESUR  STUDIES: Nm Pet Image Restage (ps) Whole Body  Result Date: 11/18/2016 CLINICAL DATA:  Subsequent treatment strategy for metastatic melanoma and left breast cancer. EXAM: NUCLEAR MEDICINE PET WHOLE BODY TECHNIQUE: 5.8 mCi F-18 FDG was injected intravenously. Full-ring PET imaging was performed from the vertex to the feet after the radiotracer.  CT data was obtained and used for attenuation correction and anatomic localization. FASTING BLOOD GLUCOSE:  Value: 99 mg/dl COMPARISON:  PET-CT 01/02/2016 and 05/08/2016. FINDINGS: HEAD/NECK No hypermetabolic cervical lymph nodes are identified.There are no lesions of the pharyngeal mucosal space. Bilateral carotid atherosclerosis noted. CHEST Mildly hypermetabolic axillary lymph nodes are again noted bilaterally. 2.0 x 1.3 cm right axillary node on image 92 has an SUV max of 3.9 (previously 4.2). Left axillary node measuring 1.9 x 0.9 cm on image 82 has an SUV max of 5.3 (previously 6.0). There are no hypermetabolic mediastinal or hilar lymph nodes. There is no suspicious pulmonary activity. 9 x 9 mm right lower lobe pulmonary nodule on image 42 is stable without hypermetabolic activity. Activity within the medial left breast lesion is similar to the previous study (SUV max 3.2, previously 3.9). ABDOMEN/PELVIS There is no hypermetabolic activity within the liver, adrenal glands, spleen or pancreas. There is no hypermetabolic nodal activity. There are stable hepatic and bilateral renal cysts, devoid of metabolic activity. Cholelithiasis, aortoiliac atherosclerosis and colonic diverticulosis again noted. SKELETON/EXTREMITIES Hypermetabolic tumor is again noted in the proximal left humerus which demonstrates a stable pathologic fracture. The distribution of activity within the bone has slightly changed with an increasingly hypermetabolic  component near the pathologic fracture (SUV max 15.7). There is also increased hypermetabolic tumor in the proximal diaphysis (SUV max 13.9). The previously demonstrated hypermetabolic soft tissue component posteriorly near the latissimus dorsi insertion has decreased in size. There is hypermetabolic marrow lesion within the distal third of the left femoral shaft (SUV max 4.7). No other suspicious osseous findings. IMPRESSION: 1. The previously demonstrated hypermetabolic activity within the left breast and bilateral axillary nodes is stable to slightly improved. 2. The osseous activity in the proximal left humerus has progressed, and there is new activity in the distal left femur. No new fractures identified. Soft tissue activity posterior to the left proximal humerus has improved. Electronically Signed   By: Richardean Sale M.D.   On: 11/18/2016 13:05     ASSESSMENT: 83 y.o. [place] woman with concurrent breast cancer and melanoma  BREAST CANCER: (1) left breast upper inner quadrant biopsy 06/06/2015 shows a clinical T2 NX, stage II invasive ductal carcinoma , grade 2, estrogen receptor and progesterone receptor strongly positive, with an MIB-1 of 5%, and no HER-2 amplification, the signals ratio being 1.42 and the number per cell 2.55.   (2) anastrozole started 07/01/2015  (a) bone density February 2017 shows osteoporosis with a T score of -2.5  (b) mammography and ultrasonography 06/23/2016 shows continuing response  MALIGNANT MELANOMA: (a) shave biopsy from the mid upper back 04/17/2015 shows invasive malignant melanoma, Breslow depth 6.5 mm plus, Clark's level 4+, with 23 mitoses per square millimeter, extensive ulceration,, with involved edges, and non-brisk host response  (i) biopsy of right and left axillary lymph nodes 06/06/2015 showed metastatic melanoma, BRAF wild type  (ii) PET scan 07/01/2015 shows bilateral axillary adenopathy, a left latissimus mass, a left pararenal mass, and 2  liver lesions  (iii) PET scan 12/06/2017shows continuing improvement in hypermetabolic lesions   (iv) PET scan 11/18/2016 shows continuing slight improvement. The left humeral fracture (initially documented 02/22/2015) shows continuing activity but a decreased soft tissue component.  (b) pembrolizumab started 07/16/2015, repeated every 21 days  (i) increased TSH noted June 2018, corrected on Synthroid   PLAN: Ermina is now a year and a half out from definitive diagnosis of her metastatic malignant melanoma. She continues to tolerate the pembrolizumab remarkably  well. The PET scan just obtained shows essentially stable disease, with  further decrease in the SUV in her bilateral axillary adenopathy. The soft tissue mass associated with her left humeral fracture is also smaller.  It is hard for me to interpret the uptake around the left humeral fracture (which is almost 81 years old now). Certainly could be active tumor. It could be healing. I think we might address her osteoporosis a bit more aggressively. She was on Fosamax before and did not tolerated well. Today we discussed multiple options including bisphosphonates and denosumab.  After much discussion she would like to give the denosumab a try. I will write her for 3 weeks from today. We discussed the possible toxicities side effects and complications of that agent including the very rare case of osteonecrosis of the jaw.  I have asked her to take additional calcium on the day of the denosumab treatment.  Otherwise the plan is to continue the pembrolizumab and the anastrozole as before. She will see me again late October. I am not planning repeated imaging before that visit. We will repeat a PET scan sometime in December  She knows to call for any problems that may develop before the next visit here.  Chauncey Cruel, MD   11/25/2016 12:03 PM

## 2016-11-25 NOTE — Patient Instructions (Signed)
[facility] Discharge Instructions for Patients Receiving Chemotherapy  Today you received the following chemotherapy agents: Keytruda   To help prevent nausea and vomiting after your treatment, we encourage you to take your nausea medication as directed    If you develop nausea and vomiting that is not controlled by your nausea medication, call the clinic.   BELOW ARE SYMPTOMS THAT SHOULD BE REPORTED IMMEDIATELY:  *FEVER GREATER THAN 100.5 F  *CHILLS WITH OR WITHOUT FEVER  NAUSEA AND VOMITING THAT IS NOT CONTROLLED WITH YOUR NAUSEA MEDICATION  *UNUSUAL SHORTNESS OF BREATH  *UNUSUAL BRUISING OR BLEEDING  TENDERNESS IN MOUTH AND THROAT WITH OR WITHOUT PRESENCE OF ULCERS  *URINARY PROBLEMS  *BOWEL PROBLEMS  UNUSUAL RASH Items with * indicate a potential emergency and should be followed up as soon as possible.  Feel free to call the clinic you have any questions or concerns. The clinic phone number is [phone].  Please show the CHEMO ALERT CARD at check-in to the Emergency Department and triage nurse.   

## 2016-12-16 ENCOUNTER — Other Ambulatory Visit (HOSPITAL_BASED_OUTPATIENT_CLINIC_OR_DEPARTMENT_OTHER): Payer: Medicare HMO

## 2016-12-16 ENCOUNTER — Ambulatory Visit (HOSPITAL_BASED_OUTPATIENT_CLINIC_OR_DEPARTMENT_OTHER): Payer: Medicare HMO

## 2016-12-16 VITALS — BP 113/49 | HR 61 | Temp 97.8°F | Resp 18

## 2016-12-16 DIAGNOSIS — C438 Malignant melanoma of overlapping sites of skin: Secondary | ICD-10-CM

## 2016-12-16 DIAGNOSIS — M810 Age-related osteoporosis without current pathological fracture: Secondary | ICD-10-CM | POA: Diagnosis not present

## 2016-12-16 DIAGNOSIS — Z5112 Encounter for antineoplastic immunotherapy: Secondary | ICD-10-CM | POA: Diagnosis not present

## 2016-12-16 DIAGNOSIS — C4359 Malignant melanoma of other part of trunk: Secondary | ICD-10-CM

## 2016-12-16 DIAGNOSIS — C439 Malignant melanoma of skin, unspecified: Secondary | ICD-10-CM

## 2016-12-16 DIAGNOSIS — M8000XG Age-related osteoporosis with current pathological fracture, unspecified site, subsequent encounter for fracture with delayed healing: Secondary | ICD-10-CM

## 2016-12-16 LAB — CBC WITH DIFFERENTIAL/PLATELET
BASO%: 0.6 % (ref 0.0–2.0)
Basophils Absolute: 0 10*3/uL (ref 0.0–0.1)
EOS%: 0 % (ref 0.0–7.0)
Eosinophils Absolute: 0 10*3/uL (ref 0.0–0.5)
HCT: 38.9 % (ref 34.8–46.6)
HGB: 12.6 g/dL (ref 11.6–15.9)
LYMPH%: 11.9 % — AB (ref 14.0–49.7)
MCH: 27.8 pg (ref 25.1–34.0)
MCHC: 32.4 g/dL (ref 31.5–36.0)
MCV: 85.9 fL (ref 79.5–101.0)
MONO#: 0.8 10*3/uL (ref 0.1–0.9)
MONO%: 12.5 % (ref 0.0–14.0)
NEUT#: 5.1 10*3/uL (ref 1.5–6.5)
NEUT%: 75 % (ref 38.4–76.8)
PLATELETS: 267 10*3/uL (ref 145–400)
RBC: 4.53 10*6/uL (ref 3.70–5.45)
RDW: 13.3 % (ref 11.2–14.5)
WBC: 6.7 10*3/uL (ref 3.9–10.3)
lymph#: 0.8 10*3/uL — ABNORMAL LOW (ref 0.9–3.3)

## 2016-12-16 LAB — COMPREHENSIVE METABOLIC PANEL
ALT: 14 U/L (ref 0–55)
ANION GAP: 12 meq/L — AB (ref 3–11)
AST: 23 U/L (ref 5–34)
Albumin: 3.7 g/dL (ref 3.5–5.0)
Alkaline Phosphatase: 54 U/L (ref 40–150)
BUN: 22.5 mg/dL (ref 7.0–26.0)
CHLORIDE: 103 meq/L (ref 98–109)
CO2: 24 meq/L (ref 22–29)
Calcium: 10.2 mg/dL (ref 8.4–10.4)
Creatinine: 0.8 mg/dL (ref 0.6–1.1)
EGFR: 65 mL/min/{1.73_m2} — AB (ref >90)
Glucose: 118 mg/dl (ref 70–140)
Potassium: 3.9 mEq/L (ref 3.5–5.1)
Sodium: 139 mEq/L (ref 136–145)
Total Bilirubin: 0.4 mg/dL (ref 0.20–1.20)
Total Protein: 6.9 g/dL (ref 6.4–8.3)

## 2016-12-16 MED ORDER — SODIUM CHLORIDE 0.9 % IV SOLN
2.0000 mg/kg | Freq: Once | INTRAVENOUS | Status: AC
  Administered 2016-12-16: 100 mg via INTRAVENOUS
  Filled 2016-12-16: qty 4

## 2016-12-16 MED ORDER — DENOSUMAB 60 MG/ML ~~LOC~~ SOLN
60.0000 mg | Freq: Once | SUBCUTANEOUS | Status: AC
  Administered 2016-12-16: 60 mg via SUBCUTANEOUS
  Filled 2016-12-16: qty 1

## 2016-12-16 MED ORDER — SODIUM CHLORIDE 0.9 % IV SOLN
Freq: Once | INTRAVENOUS | Status: AC
  Administered 2016-12-16: 10:00:00 via INTRAVENOUS

## 2016-12-16 NOTE — Patient Instructions (Signed)
[facility] Discharge Instructions for Patients Receiving Chemotherapy  Today you received the following chemotherapy agents:  Keytruda.  To help prevent nausea and vomiting after your treatment, we encourage you to take your nausea medication as prescribed.   If you develop nausea and vomiting that is not controlled by your nausea medication, call the clinic.   BELOW ARE SYMPTOMS THAT SHOULD BE REPORTED IMMEDIATELY:  *FEVER GREATER THAN 100.5 F  *CHILLS WITH OR WITHOUT FEVER  NAUSEA AND VOMITING THAT IS NOT CONTROLLED WITH YOUR NAUSEA MEDICATION  *UNUSUAL SHORTNESS OF BREATH  *UNUSUAL BRUISING OR BLEEDING  TENDERNESS IN MOUTH AND THROAT WITH OR WITHOUT PRESENCE OF ULCERS  *URINARY PROBLEMS  *BOWEL PROBLEMS  UNUSUAL RASH Items with * indicate a potential emergency and should be followed up as soon as possible.  Feel free to call the clinic you have any questions or concerns. The clinic phone number is [phone].  Please show the CHEMO ALERT CARD at check-in to the Emergency Department and triage nurse.   

## 2017-01-06 ENCOUNTER — Ambulatory Visit (HOSPITAL_BASED_OUTPATIENT_CLINIC_OR_DEPARTMENT_OTHER): Payer: Medicare HMO

## 2017-01-06 ENCOUNTER — Other Ambulatory Visit (HOSPITAL_BASED_OUTPATIENT_CLINIC_OR_DEPARTMENT_OTHER): Payer: Medicare HMO

## 2017-01-06 VITALS — BP 128/55 | HR 62 | Temp 98.4°F | Resp 18

## 2017-01-06 DIAGNOSIS — C4359 Malignant melanoma of other part of trunk: Secondary | ICD-10-CM

## 2017-01-06 DIAGNOSIS — C50212 Malignant neoplasm of upper-inner quadrant of left female breast: Secondary | ICD-10-CM

## 2017-01-06 DIAGNOSIS — Z5112 Encounter for antineoplastic immunotherapy: Secondary | ICD-10-CM

## 2017-01-06 DIAGNOSIS — Z170 Estrogen receptor positive status [ER+]: Secondary | ICD-10-CM

## 2017-01-06 DIAGNOSIS — C439 Malignant melanoma of skin, unspecified: Secondary | ICD-10-CM

## 2017-01-06 DIAGNOSIS — C787 Secondary malignant neoplasm of liver and intrahepatic bile duct: Secondary | ICD-10-CM

## 2017-01-06 DIAGNOSIS — Z79899 Other long term (current) drug therapy: Secondary | ICD-10-CM

## 2017-01-06 DIAGNOSIS — C438 Malignant melanoma of overlapping sites of skin: Secondary | ICD-10-CM

## 2017-01-06 LAB — CBC WITH DIFFERENTIAL/PLATELET
BASO%: 0.8 % (ref 0.0–2.0)
Basophils Absolute: 0.1 10*3/uL (ref 0.0–0.1)
EOS%: 0 % (ref 0.0–7.0)
Eosinophils Absolute: 0 10*3/uL (ref 0.0–0.5)
HEMATOCRIT: 37.7 % (ref 34.8–46.6)
HEMOGLOBIN: 12.5 g/dL (ref 11.6–15.9)
LYMPH#: 0.6 10*3/uL — AB (ref 0.9–3.3)
LYMPH%: 8.7 % — ABNORMAL LOW (ref 14.0–49.7)
MCH: 27.8 pg (ref 25.1–34.0)
MCHC: 33.2 g/dL (ref 31.5–36.0)
MCV: 83.5 fL (ref 79.5–101.0)
MONO#: 0.7 10*3/uL (ref 0.1–0.9)
MONO%: 10.5 % (ref 0.0–14.0)
NEUT%: 80 % — AB (ref 38.4–76.8)
NEUTROS ABS: 5.6 10*3/uL (ref 1.5–6.5)
PLATELETS: 252 10*3/uL (ref 145–400)
RBC: 4.51 10*6/uL (ref 3.70–5.45)
RDW: 13.6 % (ref 11.2–14.5)
WBC: 7 10*3/uL (ref 3.9–10.3)

## 2017-01-06 LAB — COMPREHENSIVE METABOLIC PANEL
ALT: 11 U/L (ref 0–55)
ANION GAP: 11 meq/L (ref 3–11)
AST: 21 U/L (ref 5–34)
Albumin: 3.6 g/dL (ref 3.5–5.0)
Alkaline Phosphatase: 52 U/L (ref 40–150)
BILIRUBIN TOTAL: 0.43 mg/dL (ref 0.20–1.20)
BUN: 27.5 mg/dL — ABNORMAL HIGH (ref 7.0–26.0)
CALCIUM: 10 mg/dL (ref 8.4–10.4)
CHLORIDE: 102 meq/L (ref 98–109)
CO2: 23 meq/L (ref 22–29)
CREATININE: 0.7 mg/dL (ref 0.6–1.1)
EGFR: 72 mL/min/{1.73_m2} — ABNORMAL LOW (ref >90)
GLUCOSE: 102 mg/dL (ref 70–140)
Potassium: 4.2 mEq/L (ref 3.5–5.1)
Sodium: 137 mEq/L (ref 136–145)
TOTAL PROTEIN: 6.8 g/dL (ref 6.4–8.3)

## 2017-01-06 LAB — LACTATE DEHYDROGENASE: LDH: 214 U/L (ref 125–245)

## 2017-01-06 LAB — TSH: TSH: 0.506 m[IU]/L (ref 0.308–3.960)

## 2017-01-06 MED ORDER — PEMBROLIZUMAB CHEMO INJECTION 100 MG/4ML
2.0000 mg/kg | Freq: Once | INTRAVENOUS | Status: AC
  Administered 2017-01-06: 100 mg via INTRAVENOUS
  Filled 2017-01-06: qty 4

## 2017-01-06 MED ORDER — SODIUM CHLORIDE 0.9 % IV SOLN
Freq: Once | INTRAVENOUS | Status: AC
  Administered 2017-01-06: 11:00:00 via INTRAVENOUS

## 2017-01-06 NOTE — Patient Instructions (Signed)
[facility] Discharge Instructions for Patients Receiving Chemotherapy  Today you received the following chemotherapy agents:  Keytruda.  To help prevent nausea and vomiting after your treatment, we encourage you to take your nausea medication as prescribed.   If you develop nausea and vomiting that is not controlled by your nausea medication, call the clinic.   BELOW ARE SYMPTOMS THAT SHOULD BE REPORTED IMMEDIATELY:  *FEVER GREATER THAN 100.5 F  *CHILLS WITH OR WITHOUT FEVER  NAUSEA AND VOMITING THAT IS NOT CONTROLLED WITH YOUR NAUSEA MEDICATION  *UNUSUAL SHORTNESS OF BREATH  *UNUSUAL BRUISING OR BLEEDING  TENDERNESS IN MOUTH AND THROAT WITH OR WITHOUT PRESENCE OF ULCERS  *URINARY PROBLEMS  *BOWEL PROBLEMS  UNUSUAL RASH Items with * indicate a potential emergency and should be followed up as soon as possible.  Feel free to call the clinic you have any questions or concerns. The clinic phone number is [phone].  Please show the CHEMO ALERT CARD at check-in to the Emergency Department and triage nurse.   

## 2017-01-26 ENCOUNTER — Other Ambulatory Visit (HOSPITAL_BASED_OUTPATIENT_CLINIC_OR_DEPARTMENT_OTHER): Payer: Medicare HMO

## 2017-01-26 ENCOUNTER — Ambulatory Visit (HOSPITAL_BASED_OUTPATIENT_CLINIC_OR_DEPARTMENT_OTHER): Payer: Medicare HMO

## 2017-01-26 VITALS — BP 129/59 | HR 65 | Temp 98.1°F | Resp 18

## 2017-01-26 DIAGNOSIS — C50212 Malignant neoplasm of upper-inner quadrant of left female breast: Secondary | ICD-10-CM

## 2017-01-26 DIAGNOSIS — C4359 Malignant melanoma of other part of trunk: Secondary | ICD-10-CM

## 2017-01-26 DIAGNOSIS — C438 Malignant melanoma of overlapping sites of skin: Secondary | ICD-10-CM

## 2017-01-26 DIAGNOSIS — C773 Secondary and unspecified malignant neoplasm of axilla and upper limb lymph nodes: Secondary | ICD-10-CM

## 2017-01-26 DIAGNOSIS — Z5112 Encounter for antineoplastic immunotherapy: Secondary | ICD-10-CM | POA: Diagnosis not present

## 2017-01-26 DIAGNOSIS — C439 Malignant melanoma of skin, unspecified: Secondary | ICD-10-CM

## 2017-01-26 LAB — COMPREHENSIVE METABOLIC PANEL
ALK PHOS: 51 U/L (ref 40–150)
ALT: 12 U/L (ref 0–55)
ANION GAP: 9 meq/L (ref 3–11)
AST: 20 U/L (ref 5–34)
Albumin: 3.5 g/dL (ref 3.5–5.0)
BILIRUBIN TOTAL: 0.32 mg/dL (ref 0.20–1.20)
BUN: 31 mg/dL — ABNORMAL HIGH (ref 7.0–26.0)
CO2: 25 meq/L (ref 22–29)
Calcium: 9.8 mg/dL (ref 8.4–10.4)
Chloride: 105 mEq/L (ref 98–109)
Creatinine: 0.8 mg/dL (ref 0.6–1.1)
EGFR: 68 mL/min/{1.73_m2} — AB (ref >90)
Glucose: 107 mg/dl (ref 70–140)
Potassium: 4.3 mEq/L (ref 3.5–5.1)
Sodium: 139 mEq/L (ref 136–145)
TOTAL PROTEIN: 6.9 g/dL (ref 6.4–8.3)

## 2017-01-26 LAB — CBC WITH DIFFERENTIAL/PLATELET
BASO%: 0.6 % (ref 0.0–2.0)
BASOS ABS: 0.1 10*3/uL (ref 0.0–0.1)
EOS%: 0 % (ref 0.0–7.0)
Eosinophils Absolute: 0 10*3/uL (ref 0.0–0.5)
HCT: 37.6 % (ref 34.8–46.6)
HEMOGLOBIN: 12.7 g/dL (ref 11.6–15.9)
LYMPH#: 0.6 10*3/uL — AB (ref 0.9–3.3)
LYMPH%: 7.5 % — ABNORMAL LOW (ref 14.0–49.7)
MCH: 28.2 pg (ref 25.1–34.0)
MCHC: 33.6 g/dL (ref 31.5–36.0)
MCV: 83.9 fL (ref 79.5–101.0)
MONO#: 0.8 10*3/uL (ref 0.1–0.9)
MONO%: 10 % (ref 0.0–14.0)
NEUT%: 81.9 % — ABNORMAL HIGH (ref 38.4–76.8)
NEUTROS ABS: 6.7 10*3/uL — AB (ref 1.5–6.5)
Platelets: 270 10*3/uL (ref 145–400)
RBC: 4.48 10*6/uL (ref 3.70–5.45)
RDW: 13.2 % (ref 11.2–14.5)
WBC: 8.1 10*3/uL (ref 3.9–10.3)

## 2017-01-26 MED ORDER — SODIUM CHLORIDE 0.9 % IV SOLN
Freq: Once | INTRAVENOUS | Status: AC
  Administered 2017-01-26: 12:00:00 via INTRAVENOUS

## 2017-01-26 MED ORDER — SODIUM CHLORIDE 0.9 % IV SOLN
2.0000 mg/kg | Freq: Once | INTRAVENOUS | Status: AC
  Administered 2017-01-26: 100 mg via INTRAVENOUS
  Filled 2017-01-26: qty 4

## 2017-01-26 NOTE — Patient Instructions (Signed)
[facility] Discharge Instructions for Patients Receiving Chemotherapy  Today you received the following chemotherapy agents Ketruda.   To help prevent nausea and vomiting after your treatment, we encourage you to take your nausea medication as prescribed.    If you develop nausea and vomiting that is not controlled by your nausea medication, call the clinic.   BELOW ARE SYMPTOMS THAT SHOULD BE REPORTED IMMEDIATELY:  *FEVER GREATER THAN 100.5 F  *CHILLS WITH OR WITHOUT FEVER  NAUSEA AND VOMITING THAT IS NOT CONTROLLED WITH YOUR NAUSEA MEDICATION  *UNUSUAL SHORTNESS OF BREATH  *UNUSUAL BRUISING OR BLEEDING  TENDERNESS IN MOUTH AND THROAT WITH OR WITHOUT PRESENCE OF ULCERS  *URINARY PROBLEMS  *BOWEL PROBLEMS  UNUSUAL RASH Items with * indicate a potential emergency and should be followed up as soon as possible.  Feel free to call the clinic you have any questions or concerns. The clinic phone number is [phone].  Please show the CHEMO ALERT CARD at check-in to the Emergency Department and triage nurse.   

## 2017-01-27 ENCOUNTER — Ambulatory Visit: Payer: Medicare HMO

## 2017-01-27 ENCOUNTER — Other Ambulatory Visit: Payer: Medicare HMO

## 2017-02-17 ENCOUNTER — Ambulatory Visit: Payer: Medicare HMO

## 2017-02-17 ENCOUNTER — Other Ambulatory Visit: Payer: Medicare HMO

## 2017-02-23 ENCOUNTER — Ambulatory Visit (HOSPITAL_BASED_OUTPATIENT_CLINIC_OR_DEPARTMENT_OTHER): Payer: Medicare HMO

## 2017-02-23 ENCOUNTER — Other Ambulatory Visit (HOSPITAL_BASED_OUTPATIENT_CLINIC_OR_DEPARTMENT_OTHER): Payer: Medicare HMO

## 2017-02-23 VITALS — BP 135/53 | HR 64 | Temp 97.8°F | Resp 17

## 2017-02-23 DIAGNOSIS — Z23 Encounter for immunization: Secondary | ICD-10-CM | POA: Diagnosis not present

## 2017-02-23 DIAGNOSIS — C439 Malignant melanoma of skin, unspecified: Secondary | ICD-10-CM

## 2017-02-23 DIAGNOSIS — C773 Secondary and unspecified malignant neoplasm of axilla and upper limb lymph nodes: Secondary | ICD-10-CM

## 2017-02-23 DIAGNOSIS — C50212 Malignant neoplasm of upper-inner quadrant of left female breast: Secondary | ICD-10-CM

## 2017-02-23 DIAGNOSIS — Z79899 Other long term (current) drug therapy: Secondary | ICD-10-CM

## 2017-02-23 DIAGNOSIS — C4359 Malignant melanoma of other part of trunk: Secondary | ICD-10-CM

## 2017-02-23 DIAGNOSIS — Z5112 Encounter for antineoplastic immunotherapy: Secondary | ICD-10-CM | POA: Diagnosis not present

## 2017-02-23 DIAGNOSIS — C787 Secondary malignant neoplasm of liver and intrahepatic bile duct: Secondary | ICD-10-CM

## 2017-02-23 DIAGNOSIS — Z170 Estrogen receptor positive status [ER+]: Secondary | ICD-10-CM

## 2017-02-23 DIAGNOSIS — C438 Malignant melanoma of overlapping sites of skin: Secondary | ICD-10-CM

## 2017-02-23 LAB — CBC WITH DIFFERENTIAL/PLATELET
BASO%: 0.5 % (ref 0.0–2.0)
Basophils Absolute: 0 10*3/uL (ref 0.0–0.1)
EOS%: 0 % (ref 0.0–7.0)
Eosinophils Absolute: 0 10*3/uL (ref 0.0–0.5)
HCT: 39.2 % (ref 34.8–46.6)
HGB: 13.1 g/dL (ref 11.6–15.9)
LYMPH%: 6.7 % — AB (ref 14.0–49.7)
MCH: 28.1 pg (ref 25.1–34.0)
MCHC: 33.5 g/dL (ref 31.5–36.0)
MCV: 83.7 fL (ref 79.5–101.0)
MONO#: 0.5 10*3/uL (ref 0.1–0.9)
MONO%: 6.6 % (ref 0.0–14.0)
NEUT#: 6.6 10*3/uL — ABNORMAL HIGH (ref 1.5–6.5)
NEUT%: 86.2 % — AB (ref 38.4–76.8)
Platelets: 298 10*3/uL (ref 145–400)
RBC: 4.69 10*6/uL (ref 3.70–5.45)
RDW: 13.2 % (ref 11.2–14.5)
WBC: 7.7 10*3/uL (ref 3.9–10.3)
lymph#: 0.5 10*3/uL — ABNORMAL LOW (ref 0.9–3.3)

## 2017-02-23 LAB — COMPREHENSIVE METABOLIC PANEL
ALBUMIN: 3.9 g/dL (ref 3.5–5.0)
ALK PHOS: 60 U/L (ref 40–150)
ALT: 9 U/L (ref 0–55)
AST: 20 U/L (ref 5–34)
Anion Gap: 11 mEq/L (ref 3–11)
BUN: 29.1 mg/dL — AB (ref 7.0–26.0)
CALCIUM: 10.2 mg/dL (ref 8.4–10.4)
CO2: 24 mEq/L (ref 22–29)
CREATININE: 0.8 mg/dL (ref 0.6–1.1)
Chloride: 104 mEq/L (ref 98–109)
EGFR: 66 mL/min/{1.73_m2} — ABNORMAL LOW (ref >90)
GLUCOSE: 119 mg/dL (ref 70–140)
Potassium: 4.1 mEq/L (ref 3.5–5.1)
Sodium: 138 mEq/L (ref 136–145)
TOTAL PROTEIN: 7.8 g/dL (ref 6.4–8.3)
Total Bilirubin: 0.35 mg/dL (ref 0.20–1.20)

## 2017-02-23 LAB — LACTATE DEHYDROGENASE: LDH: 226 U/L (ref 125–245)

## 2017-02-23 LAB — TSH: TSH: 0.615 m(IU)/L (ref 0.308–3.960)

## 2017-02-23 MED ORDER — INFLUENZA VAC SPLIT QUAD 0.5 ML IM SUSY
0.5000 mL | PREFILLED_SYRINGE | Freq: Once | INTRAMUSCULAR | Status: AC
  Administered 2017-02-23: 0.5 mL via INTRAMUSCULAR
  Filled 2017-02-23: qty 0.5

## 2017-02-23 MED ORDER — SODIUM CHLORIDE 0.9 % IV SOLN
Freq: Once | INTRAVENOUS | Status: AC
  Administered 2017-02-23: 11:00:00 via INTRAVENOUS

## 2017-02-23 MED ORDER — SODIUM CHLORIDE 0.9 % IV SOLN
2.0000 mg/kg | Freq: Once | INTRAVENOUS | Status: AC
  Administered 2017-02-23: 100 mg via INTRAVENOUS
  Filled 2017-02-23: qty 4

## 2017-02-23 NOTE — Patient Instructions (Signed)
[facility] Discharge Instructions for Patients Receiving Chemotherapy  Today you received the following chemotherapy agents Keytruda  To help prevent nausea and vomiting after your treatment, we encourage you to take your nausea medication as needed   If you develop nausea and vomiting that is not controlled by your nausea medication, call the clinic.   BELOW ARE SYMPTOMS THAT SHOULD BE REPORTED IMMEDIATELY:  *FEVER GREATER THAN 100.5 F  *CHILLS WITH OR WITHOUT FEVER  NAUSEA AND VOMITING THAT IS NOT CONTROLLED WITH YOUR NAUSEA MEDICATION  *UNUSUAL SHORTNESS OF BREATH  *UNUSUAL BRUISING OR BLEEDING  TENDERNESS IN MOUTH AND THROAT WITH OR WITHOUT PRESENCE OF ULCERS  *URINARY PROBLEMS  *BOWEL PROBLEMS  UNUSUAL RASH Items with * indicate a potential emergency and should be followed up as soon as possible.  Feel free to call the clinic should you have any questions or concerns. The clinic phone number is [phone].  Please show the [facility] at check-in to the Emergency Department and triage nurse.

## 2017-03-10 ENCOUNTER — Ambulatory Visit: Payer: Medicare HMO | Admitting: Oncology

## 2017-03-10 ENCOUNTER — Other Ambulatory Visit: Payer: Medicare HMO

## 2017-03-10 ENCOUNTER — Ambulatory Visit: Payer: Medicare HMO

## 2017-03-12 NOTE — Progress Notes (Signed)
[place]  Telephone:([phone]) [phone] Fax:(336) [phone]     ID: Danielle [name] DOB: Apr 11, 1926  MR#: [id number]  ZHG#:[id number]  Patient Care Team: Danielle Bill, MD as PCP - General (Family Medicine) Danielle Gemma, MD as Consulting Physician (General Surgery) Danielle [name], Danielle Dad, MD as Consulting Physician (Oncology) Danielle Kenner, MD (Dermatology) Danielle Arabian, MD as Consulting Physician (Orthopedic Surgery) PCP: Danielle Bill, MD GYN: OTHER MD:  CHIEF COMPLAINT: Estrogen receptor positive breast cancer; stage IV malignant melanoma  CURRENT TREATMENT: Anastrozole, pembrolizumab   BREAST CANCER HISTORY: From the original intake note:  Danielle [name] had an itchy spot in the middle of her back which [name] scratched sometimes, but this was not evaluated until Danielle [name] fell off a chair while changing a light bulb and broke her left upper extremity. Because [name] couldn't change while in a sling, her daughter Danielle [name] had 2 give her a hand and in the process noted a black lesion in the middle of Danielle [name] back. [name] took married to Danielle. Danielle [name] office where on 04/17/2015 he performed a shave biopsy of a pigmented lesion over the spine in the upper back. The pathology report ([id number]) showed a breast low depth of at least 6.5 mm, with the anatomic level at least 4. There were 23 mitoses per square millimeter and extensive ulceration. There was no satellitosis. There was no evidence of regression. They host response was not brisk. This was read as a pathology stage T4b and the patient was referred to Danielle [name] for wide excision.  However on exam Danielle. jerking noted bilateral axillary adenopathy and also a mass in the upper inner quadrant of the left breast. He set Danielle [name] up for bilateral mammography with tomography 06/06/2015, and this showed an irregular hyperdense mass in the left breast upper inner quadrant which was palpable. There was also a firm palpable mass in the left axilla as well as  in the contralateral, right axilla. Ultrasonography showed the left breast mass to measure 2.7 cm and at least 2 abnormally enlarged lymph nodes in the left axilla the larger one measuring 3.9 cm. In the right axilla there were also 2 abnormal appearing lymph nodes, the larger measuring 2.6 cm.  On 06/06/2015 Danielle [name] underwent biopsy of the left breast mass and both axillary adenopathy areas. The left breast mass proved to be an invasive ductal carcinoma , grade 2, estrogen and progesterone receptor positive, both at 100%, with strong staining intensity. HER-2 was not amplified, with a signals ratio of 1.42, the average copy number per cell being 2.55. The proliferation marker was 5%.  More importantly, both axillary lymph nodes showed metastatic melanoma. We were contacted and prior to today's visit we obtained a PET scan, which shows in addition to the left breast mass and the bilateral axillary lymph nodes (described as just deep to the latissimus dorsi muscles bilaterally) 2 hypermetabolic foci in the left lobe of the liver, measuring 1.3 and 0.8 cm, with an SUV max of 6.3. A 1.5 cm mass next to the left [name], clearly separate from the adrenal gland, is also hypermetabolic with an SUV max of 8.2. There were no lung or bone lesions.  Danielle [name] subsequent history is as detailed below   INTERVAL HISTORY: Danielle [name] returns today for follow-up and treatment of her malignant melanoma. We also follow her for her breast cancer. [name] is accompanied by her daughter. For the melanoma, [name] continues on pembrolizumab. [name] tolerates this well and is no longer becoming dizzy after standing. Pt's daughter  reports this stopped after reducing the dose on her blood pressure medication.   As far as the breast cancer is concerned, [name] continues on anastrozole. [name] doesn't have any side affects.    [name] also receives denosumab/Prolia, with her most recent dose 12/16/2016, to be repeated every 6 months.   REVIEW OF SYSTEMS: Danielle [name]  reports [name] is doing very well. [name] is currently doing a lot of reading. Pt was going to silver sneakers at the [facility], but notes [name] hasn't been going as often since [name] is no longer is driving. [name] recently became a great-grandmother to her Danielle [name], Danielle [name]. [name] denies unusual headaches, visual changes, nausea, vomiting, or dizziness. There has been no unusual cough, phlegm production, or pleurisy. This been no change in bowel or bladder habits. [name] denies unexplained fatigue or unexplained weight loss, bleeding, rash, or fever. A detailed review of systems was otherwise entirely negative.    PAST MEDICAL HISTORY: No past medical history on file. Remote history of rectal prolapse with chronic constipation resulting, history of left humeral fracture, osteopenia  PAST SURGICAL HISTORY: Past Surgical History:  Procedure Laterality Date  . HIP SURGERY      FAMILY HISTORY No family history on file.  The patient's father died at the age of 67 in the setting of a call abuse. The patient's mother died from "old age" at 38. The patient had 4 brothers, no sisters. One brother died in his 50s from brain cancer. Another died from a stroke, and other from emphysema. The 34, youngest brother is 44 and jogs every day. There is no history of breast or ovarian cancer in the family. There is also no prior history of melanoma.  GYNECOLOGIC HISTORY:  No LMP recorded. Patient is postmenopausal. Menarche age 74, first live birth age 81, the patient is [place] P4. [name] stopped having periods in her early 63s. [name] did not use hormone replacement. [name] never took oral contraceptives.  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 [name] is widowed. [name] is now retired, and lives alone, with no pets. Her son, Danielle [name] lives in [place], and is a retired Optometrist. Son, Danielle [name] lives in [place], [place] and is a Armed forces logistics/support/administrative officer. Daughter, Danielle [name] is a Marine scientist at [employer] locally. The fourth child, Danielle [name], died in an automobile accident at age 30. The patient has 4 grandchildren. [name] has 1 great-grandchild, named Danielle [name], from her daughters, child. 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 [name] attends services at [employer].    ADVANCED DIRECTIVES: Not yet notarized; Danielle [name] intends to name her daughter Danielle [name] as her healthcare power of attorney. [name] can be reached at [phone]   HEALTH MAINTENANCE: Social History  Substance Use Topics  . Smoking status: Never Smoker  . Smokeless tobacco: Not on file  . Alcohol use No     Colonoscopy: Remote  PAP:  Bone density: Remote  Lipid panel:  Allergies  Allergen Reactions  . Vicodin [Hydrocodone-Acetaminophen] Nausea Only    Current Outpatient Prescriptions  Medication Sig Dispense Refill  . anastrozole (ARIMIDEX) 1 MG tablet TAKE ONE TABLET (1 MG) BY MOUTH [name]. 90 tablet 3  . aspirin EC 81 MG tablet Take by mouth.    . cholecalciferol (VITAMIN D) 1000 units tablet Take 1 tablet (1,000 Units total) by mouth [name]. 100 tablet 4  . enalapril (VASOTEC) 10 MG tablet Take 1 tablet (10 mg total) by mouth [name].  90 tablet 4  . levothyroxine (SYNTHROID, LEVOTHROID) 100 MCG tablet TAKE ONE TABLET BY MOUTH [name] BEFORE BREAKFAST 30 tablet 11  . Lutein 20 MG CAPS Take by mouth.    . Red Yeast Rice Extract (RED YEAST RICE PO) Take by mouth.    . triamterene-hydrochlorothiazide (DYAZIDE) 37.5-25 MG capsule Take 1 capsule by mouth [name].     . verapamil (CALAN-SR) 240 MG CR tablet   0   No current facility-administered medications for this visit.     OBJECTIVE: Older white woman In no acute distress  Vitals:   03/16/17 1019  BP: (!) 115/54  Pulse: 71  Resp: 20  Temp: 97.8 F (36.6 C)  SpO2: 99%     Body mass index is 20.8 kg/m.    ECOG FS:1 - Symptomatic but completely ambulatory  Sclerae unicteric, EOMs intact Oropharynx clear and moist No  cervical or supraclavicular adenopathy Lungs no rales or rhonchi Heart regular rate and rhythm Abd soft, nontender, positive bowel sounds MSK kyphosis but no focal spinal tenderness, no upper extremity lymphedema Neuro: nonfocal, well oriented, appropriate affect Breasts: The right breast is unremarkable. The left breast is also unremarkable to inspection and palpation. Both axillae are benign. Skin: There is an excoriated lesion in the right nasal bridge suggestive of basal cell carcinoma  LAB RESULTS:  CMP     Component Value Date/Time   NA 140 03/16/2017 1002   K 4.0 03/16/2017 1002   CL 100 07/30/2006 1318   CO2 23 03/16/2017 1002   GLUCOSE 106 03/16/2017 1002   BUN 30.3 (H) 03/16/2017 1002   CREATININE 0.8 03/16/2017 1002   CALCIUM 9.3 03/16/2017 1002   PROT 6.8 03/16/2017 1002   ALBUMIN 3.6 03/16/2017 1002   AST 18 03/16/2017 1002   ALT 10 03/16/2017 1002   ALKPHOS 49 03/16/2017 1002   BILITOT 0.37 03/16/2017 1002   GFRNONAA 86 07/30/2006 1318   GFRAA 104 07/30/2006 1318    INo results found for: SPEP, UPEP  Lab Results  Component Value Date   WBC 6.1 03/16/2017   NEUTROABS 4.7 03/16/2017   HGB 12.2 03/16/2017   HCT 36.2 03/16/2017   MCV 84.0 03/16/2017   PLT 271 03/16/2017      Chemistry      Component Value Date/Time   NA 140 03/16/2017 1002   K 4.0 03/16/2017 1002   CL 100 07/30/2006 1318   CO2 23 03/16/2017 1002   BUN 30.3 (H) 03/16/2017 1002   CREATININE 0.8 03/16/2017 1002      Component Value Date/Time   CALCIUM 9.3 03/16/2017 1002   ALKPHOS 49 03/16/2017 1002   AST 18 03/16/2017 1002   ALT 10 03/16/2017 1002   BILITOT 0.37 03/16/2017 1002       No results found for: LABCA2  No components found for: LABCA125  No results for input(s): INR in the last 168 hours.  Urinalysis No results found for: COLORURINE, APPEARANCEUR, LABSPEC, PHURINE, GLUCOSEU, HGBUR, BILIRUBINUR, KETONESUR, PROTEINUR, UROBILINOGEN, NITRITE,  LEUKOCYTESUR  STUDIES: Whole Body PET, 11/18/2016, showed slight improvement to left breast and bilateral axillary nodes.   ASSESSMENT: 83 y.o. [place] woman with concurrent breast cancer and melanoma  BREAST CANCER: (1) left breast upper inner quadrant biopsy 06/06/2015 shows a clinical T2 NX, stage II invasive ductal carcinoma , grade 2, estrogen receptor and progesterone receptor strongly positive, with an MIB-1 of 5%, and no HER-2 amplification, the signals ratio being 1.42 and the number per cell 2.55.   (2) anastrozole  started 07/01/2015  (a) bone density February 2017 shows osteoporosis with a T score of -2.5  (b) mammography and ultrasonography 06/23/2016 shows continuing response  MALIGNANT MELANOMA: (a) shave biopsy from the mid upper back 04/17/2015 shows invasive malignant melanoma, Breslow depth 6.5 mm plus, Clark's level 4+, with 23 mitoses per square millimeter, extensive ulceration,, with involved edges, and non-brisk host response  (i) biopsy of right and left axillary lymph nodes 06/06/2015 showed metastatic melanoma, BRAF wild type  (ii) PET scan 07/01/2015 shows bilateral axillary adenopathy, a left latissimus mass, a left pararenal mass, and 2 liver lesions  (iii) PET scan 12/06/2017shows continuing improvement in hypermetabolic lesions   (iv) PET scan 11/18/2016 shows continuing slight improvement. The left humeral fracture (initially documented 02/22/2015) shows continuing activity but a decreased soft tissue component.  (b) pembrolizumab started 07/16/2015, repeated every 21 days  (i) increased TSH noted June 2018, corrected on Synthroid   PLAN: Danielle [name] is now just about 2 years out from initial diagnosis of her malignant melanoma. Her disease is well-controlled on pembrolizumab and [name] is tolerating the pembrolizumab remarkably well.  [name] is reluctant to continue beyond 2 years. We are going to obtain a PET scan in December and [name] will see me again early January.  At that point very likely [name] will choose to go on to observation alone  As far as the breast cancer is concerned [name] continues on anastrozole. [name] will be due for repeat mammography later in January, but at this point there is no evidence of active disease  [name] will receive her next Prolia dose also in January.  I have encouraged her to increase her exercise program  [name] does have a Silver sneakers membership but is not going. I have suggested twice a week as optimal  I offered her dermatologic referral to her nasal bridge lesion but [name] wishes to wait for now  [name] knows to call for any problems that may develop before her next visit here.  Greydon Betke, Danielle Dad, MD  03/16/17 10:48 AM Medical Oncology and Hematology [facility] [address] [place], [place] [address] Tel. [phone]    Fax. [phone]  This document serves as a record of services personally performed by Chauncey Cruel, MD. It was created on her behalf by Margit Banda, a trained medical scribe. The creation of this record is based on the scribe's personal observations and the provider's statements to them. This document has been checked and approved by the attending provider.

## 2017-03-16 ENCOUNTER — Ambulatory Visit (HOSPITAL_BASED_OUTPATIENT_CLINIC_OR_DEPARTMENT_OTHER): Payer: Medicare HMO | Admitting: Oncology

## 2017-03-16 ENCOUNTER — Telehealth: Payer: Self-pay | Admitting: Oncology

## 2017-03-16 ENCOUNTER — Ambulatory Visit (HOSPITAL_BASED_OUTPATIENT_CLINIC_OR_DEPARTMENT_OTHER): Payer: Medicare HMO

## 2017-03-16 ENCOUNTER — Other Ambulatory Visit (HOSPITAL_BASED_OUTPATIENT_CLINIC_OR_DEPARTMENT_OTHER): Payer: Medicare HMO

## 2017-03-16 VITALS — BP 115/54 | HR 71 | Temp 97.8°F | Resp 20 | Ht 64.0 in | Wt 121.2 lb

## 2017-03-16 DIAGNOSIS — C773 Secondary and unspecified malignant neoplasm of axilla and upper limb lymph nodes: Secondary | ICD-10-CM | POA: Diagnosis not present

## 2017-03-16 DIAGNOSIS — Z5112 Encounter for antineoplastic immunotherapy: Secondary | ICD-10-CM | POA: Diagnosis not present

## 2017-03-16 DIAGNOSIS — C50212 Malignant neoplasm of upper-inner quadrant of left female breast: Secondary | ICD-10-CM | POA: Diagnosis not present

## 2017-03-16 DIAGNOSIS — Z170 Estrogen receptor positive status [ER+]: Secondary | ICD-10-CM

## 2017-03-16 DIAGNOSIS — J349 Unspecified disorder of nose and nasal sinuses: Secondary | ICD-10-CM

## 2017-03-16 DIAGNOSIS — C787 Secondary malignant neoplasm of liver and intrahepatic bile duct: Secondary | ICD-10-CM

## 2017-03-16 DIAGNOSIS — C438 Malignant melanoma of overlapping sites of skin: Secondary | ICD-10-CM

## 2017-03-16 DIAGNOSIS — C4359 Malignant melanoma of other part of trunk: Secondary | ICD-10-CM

## 2017-03-16 DIAGNOSIS — C439 Malignant melanoma of skin, unspecified: Secondary | ICD-10-CM

## 2017-03-16 LAB — CBC WITH DIFFERENTIAL/PLATELET
BASO%: 1 % (ref 0.0–2.0)
Basophils Absolute: 0.1 10*3/uL (ref 0.0–0.1)
EOS ABS: 0 10*3/uL (ref 0.0–0.5)
EOS%: 0 % (ref 0.0–7.0)
HCT: 36.2 % (ref 34.8–46.6)
HEMOGLOBIN: 12.2 g/dL (ref 11.6–15.9)
LYMPH#: 0.7 10*3/uL — AB (ref 0.9–3.3)
LYMPH%: 11.4 % — ABNORMAL LOW (ref 14.0–49.7)
MCH: 28.2 pg (ref 25.1–34.0)
MCHC: 33.6 g/dL (ref 31.5–36.0)
MCV: 84 fL (ref 79.5–101.0)
MONO#: 0.7 10*3/uL (ref 0.1–0.9)
MONO%: 11.8 % (ref 0.0–14.0)
NEUT%: 75.8 % (ref 38.4–76.8)
NEUTROS ABS: 4.7 10*3/uL (ref 1.5–6.5)
PLATELETS: 271 10*3/uL (ref 145–400)
RBC: 4.31 10*6/uL (ref 3.70–5.45)
RDW: 13.8 % (ref 11.2–14.5)
WBC: 6.1 10*3/uL (ref 3.9–10.3)

## 2017-03-16 LAB — COMPREHENSIVE METABOLIC PANEL
ALBUMIN: 3.6 g/dL (ref 3.5–5.0)
ALK PHOS: 49 U/L (ref 40–150)
ALT: 10 U/L (ref 0–55)
ANION GAP: 10 meq/L (ref 3–11)
AST: 18 U/L (ref 5–34)
BILIRUBIN TOTAL: 0.37 mg/dL (ref 0.20–1.20)
BUN: 30.3 mg/dL — ABNORMAL HIGH (ref 7.0–26.0)
CO2: 23 mEq/L (ref 22–29)
CREATININE: 0.8 mg/dL (ref 0.6–1.1)
Calcium: 9.3 mg/dL (ref 8.4–10.4)
Chloride: 107 mEq/L (ref 98–109)
GLUCOSE: 106 mg/dL (ref 70–140)
Potassium: 4 mEq/L (ref 3.5–5.1)
Sodium: 140 mEq/L (ref 136–145)
TOTAL PROTEIN: 6.8 g/dL (ref 6.4–8.3)

## 2017-03-16 MED ORDER — PEMBROLIZUMAB CHEMO INJECTION 100 MG/4ML
2.0000 mg/kg | Freq: Once | INTRAVENOUS | Status: AC
  Administered 2017-03-16: 100 mg via INTRAVENOUS
  Filled 2017-03-16: qty 4

## 2017-03-16 MED ORDER — SODIUM CHLORIDE 0.9 % IV SOLN
Freq: Once | INTRAVENOUS | Status: AC
  Administered 2017-03-16: 11:00:00 via INTRAVENOUS

## 2017-03-16 NOTE — Addendum Note (Signed)
Addended by: Lurline Del C on: 03/16/2017 11:12 AM   Modules accepted: Orders

## 2017-03-16 NOTE — Telephone Encounter (Signed)
Gave patients daughter AVS and calendar of upcoming November, December and January appointments.

## 2017-03-16 NOTE — Patient Instructions (Signed)
[facility] Discharge Instructions for Patients Receiving Chemotherapy  Today you received the following chemotherapy agents:  Keytruda.  To help prevent nausea and vomiting after your treatment, we encourage you to take your nausea medication as directed.   If you develop nausea and vomiting that is not controlled by your nausea medication, call the clinic.   BELOW ARE SYMPTOMS THAT SHOULD BE REPORTED IMMEDIATELY:  *FEVER GREATER THAN 100.5 F  *CHILLS WITH OR WITHOUT FEVER  NAUSEA AND VOMITING THAT IS NOT CONTROLLED WITH YOUR NAUSEA MEDICATION  *UNUSUAL SHORTNESS OF BREATH  *UNUSUAL BRUISING OR BLEEDING  TENDERNESS IN MOUTH AND THROAT WITH OR WITHOUT PRESENCE OF ULCERS  *URINARY PROBLEMS  *BOWEL PROBLEMS  UNUSUAL RASH Items with * indicate a potential emergency and should be followed up as soon as possible.  Feel free to call the clinic should you have any questions or concerns. The clinic phone number is [phone].  Please show the CHEMO ALERT CARD at check-in to the Emergency Department and triage nurse.    

## 2017-03-19 ENCOUNTER — Telehealth: Payer: Self-pay | Admitting: Oncology

## 2017-03-19 NOTE — Telephone Encounter (Signed)
Patient scheduled on 1/8 per 10/15 sch message.

## 2017-04-02 ENCOUNTER — Other Ambulatory Visit: Payer: Self-pay | Admitting: Oncology

## 2017-04-02 DIAGNOSIS — C787 Secondary malignant neoplasm of liver and intrahepatic bile duct: Secondary | ICD-10-CM

## 2017-04-02 DIAGNOSIS — C50212 Malignant neoplasm of upper-inner quadrant of left female breast: Secondary | ICD-10-CM

## 2017-04-02 DIAGNOSIS — Z170 Estrogen receptor positive status [ER+]: Secondary | ICD-10-CM

## 2017-04-02 DIAGNOSIS — C438 Malignant melanoma of overlapping sites of skin: Secondary | ICD-10-CM

## 2017-04-02 NOTE — Progress Notes (Unsigned)
Insurance will not approve further pembrolizumab treatments unless we have data and so we are moving her PET scan from December to April 20, 2017; discussed with the patient's daughter Cristela Blue

## 2017-04-06 ENCOUNTER — Other Ambulatory Visit (HOSPITAL_BASED_OUTPATIENT_CLINIC_OR_DEPARTMENT_OTHER): Payer: Medicare HMO

## 2017-04-06 ENCOUNTER — Ambulatory Visit (HOSPITAL_BASED_OUTPATIENT_CLINIC_OR_DEPARTMENT_OTHER): Payer: Medicare HMO

## 2017-04-06 VITALS — BP 157/78 | HR 75 | Temp 98.5°F | Resp 18

## 2017-04-06 DIAGNOSIS — C787 Secondary malignant neoplasm of liver and intrahepatic bile duct: Secondary | ICD-10-CM

## 2017-04-06 DIAGNOSIS — Z170 Estrogen receptor positive status [ER+]: Secondary | ICD-10-CM

## 2017-04-06 DIAGNOSIS — Z5112 Encounter for antineoplastic immunotherapy: Secondary | ICD-10-CM | POA: Diagnosis not present

## 2017-04-06 DIAGNOSIS — C439 Malignant melanoma of skin, unspecified: Secondary | ICD-10-CM

## 2017-04-06 DIAGNOSIS — Z79899 Other long term (current) drug therapy: Secondary | ICD-10-CM | POA: Diagnosis not present

## 2017-04-06 DIAGNOSIS — C773 Secondary and unspecified malignant neoplasm of axilla and upper limb lymph nodes: Secondary | ICD-10-CM | POA: Diagnosis not present

## 2017-04-06 DIAGNOSIS — C50212 Malignant neoplasm of upper-inner quadrant of left female breast: Secondary | ICD-10-CM

## 2017-04-06 DIAGNOSIS — C438 Malignant melanoma of overlapping sites of skin: Secondary | ICD-10-CM

## 2017-04-06 DIAGNOSIS — C4359 Malignant melanoma of other part of trunk: Secondary | ICD-10-CM

## 2017-04-06 LAB — COMPREHENSIVE METABOLIC PANEL WITH GFR
ALT: 10 U/L (ref 0–55)
AST: 21 U/L (ref 5–34)
Albumin: 3.8 g/dL (ref 3.5–5.0)
Alkaline Phosphatase: 54 U/L (ref 40–150)
Anion Gap: 10 meq/L (ref 3–11)
BUN: 20.3 mg/dL (ref 7.0–26.0)
CO2: 25 meq/L (ref 22–29)
Calcium: 9.7 mg/dL (ref 8.4–10.4)
Chloride: 104 meq/L (ref 98–109)
Creatinine: 0.8 mg/dL (ref 0.6–1.1)
EGFR: >60 ml/min/1.73 m2
Glucose: 97 mg/dL (ref 70–140)
Potassium: 4.1 meq/L (ref 3.5–5.1)
Sodium: 139 meq/L (ref 136–145)
Total Bilirubin: 0.33 mg/dL (ref 0.20–1.20)
Total Protein: 7.2 g/dL (ref 6.4–8.3)

## 2017-04-06 LAB — CBC WITH DIFFERENTIAL/PLATELET
BASO%: 0.7 % (ref 0.0–2.0)
Basophils Absolute: 0.1 10e3/uL (ref 0.0–0.1)
EOS%: 0 % (ref 0.0–7.0)
Eosinophils Absolute: 0 10e3/uL (ref 0.0–0.5)
HCT: 37.7 % (ref 34.8–46.6)
HGB: 12.5 g/dL (ref 11.6–15.9)
LYMPH%: 10.2 % — ABNORMAL LOW (ref 14.0–49.7)
MCH: 27.9 pg (ref 25.1–34.0)
MCHC: 33.2 g/dL (ref 31.5–36.0)
MCV: 84.2 fL (ref 79.5–101.0)
MONO#: 0.8 10e3/uL (ref 0.1–0.9)
MONO%: 10.3 % (ref 0.0–14.0)
NEUT#: 5.9 10e3/uL (ref 1.5–6.5)
NEUT%: 78.8 % — ABNORMAL HIGH (ref 38.4–76.8)
Platelets: 278 10e3/uL (ref 145–400)
RBC: 4.47 10e6/uL (ref 3.70–5.45)
RDW: 13.1 % (ref 11.2–14.5)
WBC: 7.5 10e3/uL (ref 3.9–10.3)
lymph#: 0.8 10e3/uL — ABNORMAL LOW (ref 0.9–3.3)

## 2017-04-06 LAB — TSH: TSH: 0.294 m[IU]/L — ABNORMAL LOW (ref 0.308–3.960)

## 2017-04-06 LAB — LACTATE DEHYDROGENASE: LDH: 209 U/L (ref 125–245)

## 2017-04-06 MED ORDER — PEMBROLIZUMAB CHEMO INJECTION 100 MG/4ML
2.0000 mg/kg | Freq: Once | INTRAVENOUS | Status: AC
  Administered 2017-04-06: 100 mg via INTRAVENOUS
  Filled 2017-04-06: qty 4

## 2017-04-06 MED ORDER — SODIUM CHLORIDE 0.9 % IV SOLN
Freq: Once | INTRAVENOUS | Status: AC
  Administered 2017-04-06: 15:00:00 via INTRAVENOUS

## 2017-04-06 NOTE — Patient Instructions (Signed)
[facility] Discharge Instructions for Patients Receiving Chemotherapy  Today you received the following chemotherapy agents Keytruda  To help prevent nausea and vomiting after your treatment, we encourage you to take your nausea medication as needed   If you develop nausea and vomiting that is not controlled by your nausea medication, call the clinic.   BELOW ARE SYMPTOMS THAT SHOULD BE REPORTED IMMEDIATELY:  *FEVER GREATER THAN 100.5 F  *CHILLS WITH OR WITHOUT FEVER  NAUSEA AND VOMITING THAT IS NOT CONTROLLED WITH YOUR NAUSEA MEDICATION  *UNUSUAL SHORTNESS OF BREATH  *UNUSUAL BRUISING OR BLEEDING  TENDERNESS IN MOUTH AND THROAT WITH OR WITHOUT PRESENCE OF ULCERS  *URINARY PROBLEMS  *BOWEL PROBLEMS  UNUSUAL RASH Items with * indicate a potential emergency and should be followed up as soon as possible.  Feel free to call the clinic should you have any questions or concerns. The clinic phone number is [phone].  Please show the [place] at check-in to the Emergency Department and triage nurse.

## 2017-04-26 ENCOUNTER — Encounter (HOSPITAL_COMMUNITY)
Admission: RE | Admit: 2017-04-26 | Discharge: 2017-04-26 | Disposition: A | Discharge status: Home / Self Care | Payer: Medicare HMO | Source: Ambulatory Visit | Attending: Oncology | Admitting: Oncology

## 2017-04-26 ENCOUNTER — Other Ambulatory Visit: Payer: Self-pay | Admitting: Oncology

## 2017-04-26 DIAGNOSIS — C50212 Malignant neoplasm of upper-inner quadrant of left female breast: Secondary | ICD-10-CM | POA: Insufficient documentation

## 2017-04-26 DIAGNOSIS — Z170 Estrogen receptor positive status [ER+]: Secondary | ICD-10-CM | POA: Diagnosis present

## 2017-04-26 DIAGNOSIS — C787 Secondary malignant neoplasm of liver and intrahepatic bile duct: Secondary | ICD-10-CM | POA: Diagnosis not present

## 2017-04-26 DIAGNOSIS — C438 Malignant melanoma of overlapping sites of skin: Secondary | ICD-10-CM | POA: Diagnosis present

## 2017-04-26 LAB — GLUCOSE, CAPILLARY: GLUCOSE-CAPILLARY: 100 mg/dL — AB (ref 65–99)

## 2017-04-26 MED ORDER — FLUDEOXYGLUCOSE F - 18 (FDG) INJECTION
6.0400 | Freq: Once | INTRAVENOUS | Status: AC | PRN
  Administered 2017-04-26: 6.04 via INTRAVENOUS

## 2017-04-27 ENCOUNTER — Other Ambulatory Visit (HOSPITAL_BASED_OUTPATIENT_CLINIC_OR_DEPARTMENT_OTHER): Payer: Medicare HMO

## 2017-04-27 ENCOUNTER — Ambulatory Visit (HOSPITAL_BASED_OUTPATIENT_CLINIC_OR_DEPARTMENT_OTHER): Payer: Medicare HMO

## 2017-04-27 VITALS — BP 127/58 | HR 65 | Temp 98.3°F | Resp 17 | Wt 120.5 lb

## 2017-04-27 DIAGNOSIS — Z5112 Encounter for antineoplastic immunotherapy: Secondary | ICD-10-CM

## 2017-04-27 DIAGNOSIS — C438 Malignant melanoma of overlapping sites of skin: Secondary | ICD-10-CM

## 2017-04-27 DIAGNOSIS — C439 Malignant melanoma of skin, unspecified: Secondary | ICD-10-CM

## 2017-04-27 DIAGNOSIS — C773 Secondary and unspecified malignant neoplasm of axilla and upper limb lymph nodes: Secondary | ICD-10-CM | POA: Diagnosis not present

## 2017-04-27 DIAGNOSIS — C4359 Malignant melanoma of other part of trunk: Secondary | ICD-10-CM | POA: Diagnosis not present

## 2017-04-27 LAB — COMPREHENSIVE METABOLIC PANEL
ALBUMIN: 3.7 g/dL (ref 3.5–5.0)
ALK PHOS: 55 U/L (ref 40–150)
ALT: 14 U/L (ref 0–55)
AST: 21 U/L (ref 5–34)
Anion Gap: 12 mEq/L — ABNORMAL HIGH (ref 3–11)
BUN: 27.7 mg/dL — AB (ref 7.0–26.0)
CO2: 24 mEq/L (ref 22–29)
Calcium: 9.6 mg/dL (ref 8.4–10.4)
Chloride: 104 mEq/L (ref 98–109)
Creatinine: 0.9 mg/dL (ref 0.6–1.1)
EGFR: 55 mL/min/{1.73_m2} — ABNORMAL LOW (ref >60)
GLUCOSE: 97 mg/dL (ref 70–140)
POTASSIUM: 3.8 meq/L (ref 3.5–5.1)
SODIUM: 140 meq/L (ref 136–145)
Total Bilirubin: 0.3 mg/dL (ref 0.20–1.20)
Total Protein: 7 g/dL (ref 6.4–8.3)

## 2017-04-27 LAB — CBC WITH DIFFERENTIAL/PLATELET
BASO%: 1 % (ref 0.0–2.0)
BASOS ABS: 0.1 10*3/uL (ref 0.0–0.1)
EOS%: 0 % (ref 0.0–7.0)
Eosinophils Absolute: 0 10*3/uL (ref 0.0–0.5)
HCT: 37.7 % (ref 34.8–46.6)
HEMOGLOBIN: 12.3 g/dL (ref 11.6–15.9)
LYMPH%: 6.8 % — ABNORMAL LOW (ref 14.0–49.7)
MCH: 27.8 pg (ref 25.1–34.0)
MCHC: 32.6 g/dL (ref 31.5–36.0)
MCV: 85.3 fL (ref 79.5–101.0)
MONO#: 0.8 10*3/uL (ref 0.1–0.9)
MONO%: 8.8 % (ref 0.0–14.0)
NEUT%: 83.4 % — ABNORMAL HIGH (ref 38.4–76.8)
NEUTROS ABS: 7.4 10*3/uL — AB (ref 1.5–6.5)
Platelets: 295 10*3/uL (ref 145–400)
RBC: 4.42 10*6/uL (ref 3.70–5.45)
RDW: 13.3 % (ref 11.2–14.5)
WBC: 8.9 10*3/uL (ref 3.9–10.3)
lymph#: 0.6 10*3/uL — ABNORMAL LOW (ref 0.9–3.3)

## 2017-04-27 MED ORDER — SODIUM CHLORIDE 0.9 % IV SOLN
Freq: Once | INTRAVENOUS | Status: AC
  Administered 2017-04-27: 14:00:00 via INTRAVENOUS

## 2017-04-27 MED ORDER — SODIUM CHLORIDE 0.9 % IV SOLN
2.0000 mg/kg | Freq: Once | INTRAVENOUS | Status: AC
  Administered 2017-04-27: 100 mg via INTRAVENOUS
  Filled 2017-04-27: qty 4

## 2017-04-27 NOTE — Patient Instructions (Signed)
[facility] Discharge Instructions for Patients Receiving Chemotherapy  Today you received the following chemotherapy agents:  Keytruda.  To help prevent nausea and vomiting after your treatment, we encourage you to take your nausea medication as directed.   If you develop nausea and vomiting that is not controlled by your nausea medication, call the clinic.   BELOW ARE SYMPTOMS THAT SHOULD BE REPORTED IMMEDIATELY:  *FEVER GREATER THAN 100.5 F  *CHILLS WITH OR WITHOUT FEVER  NAUSEA AND VOMITING THAT IS NOT CONTROLLED WITH YOUR NAUSEA MEDICATION  *UNUSUAL SHORTNESS OF BREATH  *UNUSUAL BRUISING OR BLEEDING  TENDERNESS IN MOUTH AND THROAT WITH OR WITHOUT PRESENCE OF ULCERS  *URINARY PROBLEMS  *BOWEL PROBLEMS  UNUSUAL RASH Items with * indicate a potential emergency and should be followed up as soon as possible.  Feel free to call the clinic should you have any questions or concerns. The clinic phone number is [phone].  Please show the CHEMO ALERT CARD at check-in to the Emergency Department and triage nurse.    

## 2017-05-18 ENCOUNTER — Other Ambulatory Visit (HOSPITAL_BASED_OUTPATIENT_CLINIC_OR_DEPARTMENT_OTHER): Payer: Medicare HMO

## 2017-05-18 ENCOUNTER — Ambulatory Visit (HOSPITAL_BASED_OUTPATIENT_CLINIC_OR_DEPARTMENT_OTHER): Payer: Medicare HMO

## 2017-05-18 ENCOUNTER — Telehealth: Payer: Self-pay

## 2017-05-18 VITALS — BP 126/54 | HR 72 | Temp 98.1°F | Resp 16 | Wt 117.4 lb

## 2017-05-18 DIAGNOSIS — C50212 Malignant neoplasm of upper-inner quadrant of left female breast: Secondary | ICD-10-CM

## 2017-05-18 DIAGNOSIS — C4359 Malignant melanoma of other part of trunk: Secondary | ICD-10-CM

## 2017-05-18 DIAGNOSIS — Z170 Estrogen receptor positive status [ER+]: Secondary | ICD-10-CM

## 2017-05-18 DIAGNOSIS — C787 Secondary malignant neoplasm of liver and intrahepatic bile duct: Secondary | ICD-10-CM

## 2017-05-18 DIAGNOSIS — C773 Secondary and unspecified malignant neoplasm of axilla and upper limb lymph nodes: Secondary | ICD-10-CM

## 2017-05-18 DIAGNOSIS — Z5112 Encounter for antineoplastic immunotherapy: Secondary | ICD-10-CM | POA: Diagnosis not present

## 2017-05-18 DIAGNOSIS — C438 Malignant melanoma of overlapping sites of skin: Secondary | ICD-10-CM

## 2017-05-18 DIAGNOSIS — Z79899 Other long term (current) drug therapy: Secondary | ICD-10-CM

## 2017-05-18 DIAGNOSIS — C439 Malignant melanoma of skin, unspecified: Secondary | ICD-10-CM

## 2017-05-18 LAB — COMPREHENSIVE METABOLIC PANEL
ALBUMIN: 3.8 g/dL (ref 3.5–5.0)
ALK PHOS: 55 U/L (ref 40–150)
ALT: 11 U/L (ref 0–55)
ANION GAP: 12 meq/L — AB (ref 3–11)
AST: 22 U/L (ref 5–34)
BILIRUBIN TOTAL: 0.4 mg/dL (ref 0.20–1.20)
BUN: 24.8 mg/dL (ref 7.0–26.0)
CO2: 23 meq/L (ref 22–29)
CREATININE: 0.8 mg/dL (ref 0.6–1.1)
Calcium: 9.7 mg/dL (ref 8.4–10.4)
Chloride: 105 mEq/L (ref 98–109)
EGFR: >60 mL/min/{1.73_m2} (ref >60)
Glucose: 90 mg/dl (ref 70–140)
Potassium: 3.9 mEq/L (ref 3.5–5.1)
Sodium: 139 mEq/L (ref 136–145)
TOTAL PROTEIN: 7 g/dL (ref 6.4–8.3)

## 2017-05-18 LAB — CBC WITH DIFFERENTIAL/PLATELET
BASO%: 0.5 % (ref 0.0–2.0)
Basophils Absolute: 0 10*3/uL (ref 0.0–0.1)
EOS ABS: 0 10*3/uL (ref 0.0–0.5)
EOS%: 0 % (ref 0.0–7.0)
HEMATOCRIT: 35.7 % (ref 34.8–46.6)
HEMOGLOBIN: 11.6 g/dL (ref 11.6–15.9)
LYMPH#: 0.7 10*3/uL — AB (ref 0.9–3.3)
LYMPH%: 9.7 % — AB (ref 14.0–49.7)
MCH: 28 pg (ref 25.1–34.0)
MCHC: 32.5 g/dL (ref 31.5–36.0)
MCV: 86 fL (ref 79.5–101.0)
MONO#: 0.8 10*3/uL (ref 0.1–0.9)
MONO%: 11 % (ref 0.0–14.0)
NEUT%: 78.8 % — ABNORMAL HIGH (ref 38.4–76.8)
NEUTROS ABS: 6 10*3/uL (ref 1.5–6.5)
PLATELETS: 261 10*3/uL (ref 145–400)
RBC: 4.15 10*6/uL (ref 3.70–5.45)
RDW: 13.4 % (ref 11.2–14.5)
WBC: 7.6 10*3/uL (ref 3.9–10.3)

## 2017-05-18 LAB — LACTATE DEHYDROGENASE: LDH: 172 U/L (ref 125–245)

## 2017-05-18 LAB — TSH: TSH: 0.645 m[IU]/L (ref 0.308–3.960)

## 2017-05-18 MED ORDER — SODIUM CHLORIDE 0.9 % IV SOLN
Freq: Once | INTRAVENOUS | Status: AC
  Administered 2017-05-18: 15:00:00 via INTRAVENOUS

## 2017-05-18 MED ORDER — SODIUM CHLORIDE 0.9 % IV SOLN
2.0000 mg/kg | Freq: Once | INTRAVENOUS | Status: AC
  Administered 2017-05-18: 100 mg via INTRAVENOUS
  Filled 2017-05-18: qty 4

## 2017-05-18 NOTE — Patient Instructions (Signed)
[facility] Discharge Instructions for Patients Receiving Chemotherapy  Today you received the following chemotherapy agents keytruda   To help prevent nausea and vomiting after your treatment, we encourage you to take your nausea medication as directed  If you develop nausea and vomiting that is not controlled by your nausea medication, call the clinic.   BELOW ARE SYMPTOMS THAT SHOULD BE REPORTED IMMEDIATELY:  *FEVER GREATER THAN 100.5 F  *CHILLS WITH OR WITHOUT FEVER  NAUSEA AND VOMITING THAT IS NOT CONTROLLED WITH YOUR NAUSEA MEDICATION  *UNUSUAL SHORTNESS OF BREATH  *UNUSUAL BRUISING OR BLEEDING  TENDERNESS IN MOUTH AND THROAT WITH OR WITHOUT PRESENCE OF ULCERS  *URINARY PROBLEMS  *BOWEL PROBLEMS  UNUSUAL RASH Items with * indicate a potential emergency and should be followed up as soon as possible.  Feel free to call the clinic you have any questions or concerns. The clinic phone number is [phone].  

## 2017-05-18 NOTE — Telephone Encounter (Signed)
Received in basket msg from infusion room nurse updating on Danielle [name]'s condition today.  Pt reports periodically spitting up "clear bubbly phlegm with some shortness of breath" and dull intermittent "discomfort" in LUQ.  No fever, chills.  This RN spoke with pt's dtr by phone who confirms above.  Dtr feels like pt may be deconditioning d/t lack of exercise recently as she seems more [facility] with activity but not at rest.  Dtr does not feel like pt needs to be seen at this time and plans on discussing symptoms with Dr Jana Hakim at 06/08/17 appt.  Dtr instructed to please make [place] aware if symptoms worsen.  Dtr agreeable.

## 2017-06-07 NOTE — Progress Notes (Signed)
[name]  [phone] [phone]     ID: Danielle [name] DOB: Mar 28, 1926  MR#: [id number]  HEN#:[id number]  Patient Care Team: Bartholome Bill, MD as PCP - General (Family Medicine) Armandina Gemma, MD as Consulting Physician (General Surgery) Magrinat, Virgie Dad, MD as Consulting Physician (Oncology) Allyn Kenner, MD (Dermatology) Gaynelle Arabian, MD as Consulting Physician (Orthopedic Surgery) PCP: Bartholome Bill, MD GYN: OTHER MD:  CHIEF COMPLAINT: Estrogen receptor positive breast cancer; stage IV malignant melanoma  CURRENT TREATMENT: Anastrozole, pembrolizumab   BREAST CANCER HISTORY: From the original intake note:  Tajuanna she had an itchy spot in the middle of her back which she scratched sometimes, but this was not evaluated until [facility], The fell off a chair while changing a light bulb and broke her left upper extremity. Because she couldn't change while in a sling, her daughter [name] had 2 give her a hand and in the process noted a black lesion in the middle of Danielle [name]'s back. She took married to Dr. [name] office where on 04/17/2015 he performed a shave biopsy of a pigmented lesion over the spine in the upper back. The pathology report ([id number]) showed a breast low depth of at least 6.5 mm, with the anatomic level at least 4. There were 23 mitoses per square millimeter and extensive ulceration. There was no satellitosis. There was no evidence of regression. They host response was not brisk. This was read as a pathology stage T4b and the patient was referred to [name] for wide excision.  However on exam Dr. jerking noted bilateral axillary adenopathy and also a mass in the upper inner quadrant of the left breast. He set [name] up for bilateral mammography with tomography 06/06/2015, and this showed an irregular hyperdense mass in the left breast upper inner quadrant which was palpable. There was also a firm palpable mass in the left axilla as well as  in the contralateral, right axilla. Ultrasonography showed the left breast mass to measure 2.7 cm and at least 2 abnormally enlarged lymph nodes in the left axilla the larger one measuring 3.9 cm. In the right axilla there were also 2 abnormal appearing lymph nodes, the larger measuring 2.6 cm.  On 06/06/2015 Jolanta underwent biopsy of the left breast mass and both axillary adenopathy areas. The left breast mass proved to be an invasive ductal carcinoma , grade 2, estrogen and progesterone receptor positive, both at 100%, with strong staining intensity. HER-2 was not amplified, with a signals ratio of 1.42, the average copy number per cell being 2.55. The proliferation marker was 5%.  More importantly, both axillary lymph nodes showed metastatic melanoma. We were contacted and prior to today's visit we obtained a PET scan, which shows in addition to the left breast mass and the bilateral axillary lymph nodes (described as just deep to the latissimus dorsi muscles bilaterally) 2 hypermetabolic foci in the left lobe of the liver, measuring 1.3 and 0.8 cm, with an SUV max of 6.3. A 1.5 cm mass next to the left kidney, clearly separate from the adrenal gland, is also hypermetabolic with an SUV max of 8.2. There were no lung or bone lesions.  [place] subsequent history is as detailed below   INTERVAL HISTORY: Danielle [name] returns today for follow-up and treatment of her malignant melanoma accompanied by her daughter. We also follow her for her breast cancer. She continues on pembrolizumab for the melanoma. She reports that she doesn't get dizzy like she used to anymore. She pays $5000 per  year out of her insurance deductible.  As for her breast cancer, she continues on anastrozole, with good tolerance. She denies any issues hot flashes and vaginal dryness.   She completed a PET scan on 04/26/2017 showing: Generally mild to moderate improvement in the prior osseous and nodal metastatic lesions. Branching nodular density  in the right lower lobe is stable in size, maximum SUV 1.4 (formerly 1.9). Other imaging findings of potential clinical significance: Dense mitral calcification. Aortic Atherosclerosis (ICD10-I70.0). Coronary atherosclerosis. Diffuse colonic diverticulosis. Cholelithiasis. Renal cysts including a complex left renal cyst.   REVIEW OF SYSTEMS: Danielle [name] reports that she is well overall. She enjoys spending time with her family. She reports that she enjoys face timing her great grandson, who is now 82 months old. She denies unusual headaches, visual changes, nausea, vomiting, or dizziness. There has been no unusual cough, phlegm production, or pleurisy. This been no change in bowel or bladder habits. She denies unexplained fatigue or unexplained weight loss, bleeding, rash, or fever. A detailed review of systems was otherwise stable.   PAST MEDICAL HISTORY: No past medical history on file. Remote history of rectal prolapse with chronic constipation resulting, history of left humeral fracture, osteopenia  PAST SURGICAL HISTORY: Past Surgical History:  Procedure Laterality Date  . HIP SURGERY      FAMILY HISTORY No family history on file.  The patient's father died at the age of 82 in the setting of a call abuse. The patient's mother died from "old age" at 82. The patient had 4 brothers, no sisters. One brother died in his 57s from brain cancer. Another died from a stroke, and other from emphysema. The 78, youngest brother is 67 and jogs every day. There is no history of breast or ovarian cancer in the family. There is also no prior history of melanoma.  GYNECOLOGIC HISTORY:  No LMP recorded. Patient is postmenopausal. Menarche age 84, first live birth age 13, the patient is [place] P4. She stopped having periods in her early 15s. She did not use hormone replacement. She never took oral contraceptives.  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 She is widowed. She is now retired, and lives alone, with no pets. Her son, [name] lives in [place], and is a retired Optometrist. Son, [name] lives in [place], [place] and is a Chief of Staff. Daughter, [name] is a Marine scientist at [employer] locally. The fourth child, [name], died in an automobile accident at age 53. The patient has 4 grandchildren. She has 1 great-grandchild, named Norva Pavlov Daily, from her daughters, child. She is a Corporate treasurer. She attends services at [employer].    ADVANCED DIRECTIVES: Not yet notarized; Ceanna intends to name her daughter [name] as her healthcare power of attorney. She can be reached at [phone]   HEALTH MAINTENANCE: Social History   Tobacco Use  . Smoking status: Never Smoker  Substance Use Topics  . Alcohol use: No  . Drug use: No     Colonoscopy: Remote  PAP:  Bone density: Remote  Lipid panel:  Allergies  Allergen Reactions  . Vicodin [Hydrocodone-Acetaminophen] Nausea Only    Current Outpatient Medications  Medication Sig Dispense Refill  . anastrozole (ARIMIDEX) 1 MG tablet TAKE ONE TABLET (1 MG) BY MOUTH DAILY. 90 tablet 3  . aspirin EC 81 MG tablet Take by mouth.    . cholecalciferol (VITAMIN D) 1000 units tablet Take 1 tablet (1,000  Units total) by mouth daily. 100 tablet 4  . enalapril (VASOTEC) 10 MG tablet Take 1 tablet (10 mg total) by mouth daily. 90 tablet 4  . levothyroxine (SYNTHROID, LEVOTHROID) 100 MCG tablet TAKE ONE TABLET BY MOUTH DAILY BEFORE BREAKFAST 30 tablet 11  . Lutein 20 MG CAPS Take by mouth.    . Red Yeast Rice Extract (RED YEAST RICE PO) Take by mouth.    . triamterene-hydrochlorothiazide (DYAZIDE) 37.5-25 MG capsule Take 1 capsule by mouth daily.     . verapamil (CALAN-SR) 240 MG CR tablet   0   No current facility-administered medications for this visit.     OBJECTIVE: Older white woman who appears stated age  89:   06/08/17 1352  BP: (!) 106/55    Pulse: 72  Resp: 16  Temp: (!) 97.5 F (36.4 C)  SpO2: 99%     Body mass index is 20.29 kg/m.    ECOG FS:1 - Symptomatic but completely ambulatory  Sclerae unicteric, pupils round and equal Oropharynx clear and moist No cervical or supraclavicular adenopathy Lungs no rales or rhonchi Heart regular rate and rhythm Abd soft, nontender, positive bowel sounds MSK no focal spinal tenderness, no upper extremity lymphedema Neuro: nonfocal, well oriented, appropriate affect Breasts: The right breast is benign.  The left breast shows no palpable mass.  Both axillae are benign.  LAB RESULTS:  CMP     Component Value Date/Time   NA 140 06/08/2017 1316   NA 139 05/18/2017 1325   K 3.8 06/08/2017 1316   K 3.9 05/18/2017 1325   CL 106 06/08/2017 1316   CO2 23 06/08/2017 1316   CO2 23 05/18/2017 1325   GLUCOSE 108 06/08/2017 1316   GLUCOSE 90 05/18/2017 1325   BUN 28 (H) 06/08/2017 1316   BUN 24.8 05/18/2017 1325   CREATININE 0.94 06/08/2017 1316   CREATININE 0.8 05/18/2017 1325   CALCIUM 9.5 06/08/2017 1316   CALCIUM 9.7 05/18/2017 1325   PROT 7.0 06/08/2017 1316   PROT 7.0 05/18/2017 1325   ALBUMIN 3.7 06/08/2017 1316   ALBUMIN 3.8 05/18/2017 1325   AST 21 06/08/2017 1316   AST 22 05/18/2017 1325   ALT 13 06/08/2017 1316   ALT 11 05/18/2017 1325   ALKPHOS 56 06/08/2017 1316   ALKPHOS 55 05/18/2017 1325   BILITOT 0.3 06/08/2017 1316   BILITOT 0.40 05/18/2017 1325   GFRNONAA 52 (L) 06/08/2017 1316   GFRAA >60 06/08/2017 1316    INo results found for: SPEP, UPEP  Lab Results  Component Value Date   WBC 7.5 06/08/2017   NEUTROABS 5.8 06/08/2017   HGB 11.9 06/08/2017   HCT 36.3 06/08/2017   MCV 84.1 06/08/2017   PLT 309 06/08/2017      Chemistry      Component Value Date/Time   NA 140 06/08/2017 1316   NA 139 05/18/2017 1325   K 3.8 06/08/2017 1316   K 3.9 05/18/2017 1325   CL 106 06/08/2017 1316   CO2 23 06/08/2017 1316   CO2 23 05/18/2017 1325   BUN 28  (H) 06/08/2017 1316   BUN 24.8 05/18/2017 1325   CREATININE 0.94 06/08/2017 1316   CREATININE 0.8 05/18/2017 1325      Component Value Date/Time   CALCIUM 9.5 06/08/2017 1316   CALCIUM 9.7 05/18/2017 1325   ALKPHOS 56 06/08/2017 1316   ALKPHOS 55 05/18/2017 1325   AST 21 06/08/2017 1316   AST 22 05/18/2017 1325   ALT 13 06/08/2017 1316  ALT 11 05/18/2017 1325   BILITOT 0.3 06/08/2017 1316   BILITOT 0.40 05/18/2017 1325       No results found for: LABCA2  No components found for: LABCA125  No results for input(s): INR in the last 168 hours.  Urinalysis No results found for: COLORURINE, APPEARANCEUR, LABSPEC, PHURINE, GLUCOSEU, HGBUR, BILIRUBINUR, KETONESUR, PROTEINUR, UROBILINOGEN, NITRITE, LEUKOCYTESUR  STUDIES: She completed a PET scan on 04/26/2017 showing: Generally mild to moderate improvement in the prior osseous and nodal metastatic lesions. Branching nodular density in the right lower lobe is stable in size, maximum SUV 1.4 (formerly 1.9). Other imaging findings of potential clinical significance: Dense mitral calcification. Aortic Atherosclerosis (ICD10-I70.0). Coronary atherosclerosis. Diffuse colonic diverticulosis. Cholelithiasis. Renal cysts including a complex left renal cyst.  ASSESSMENT: 84 y.o. [place] woman with concurrent breast cancer and melanoma  BREAST CANCER: (1) left breast upper inner quadrant biopsy 06/06/2015 shows a clinical T2 NX, stage II invasive ductal carcinoma , grade 2, estrogen receptor and progesterone receptor strongly positive, with an MIB-1 of 5%, and no HER-2 amplification, the signals ratio being 1.42 and the number per cell 2.55.   (2) anastrozole started 07/01/2015  (a) bone density February 2017 shows osteoporosis with a T score of -2.5  (b) mammography and ultrasonography 06/23/2016 shows continuing response  MALIGNANT MELANOMA: (a) shave biopsy from the mid upper back 04/17/2015 shows invasive malignant melanoma, B[place]low  depth 6.5 mm plus, Clark's level 4+, with 23 mitoses per square millimeter, extensive ulceration,, with involved edges, and non-brisk host response  (i) biopsy of right and left axillary lymph nodes 06/06/2015 showed metastatic melanoma, BRAF wild type  (ii) PET scan 07/01/2015 shows bilateral axillary adenopathy, a left latissimus mass, a left pararenal mass, and 2 liver lesions  (iii) PET scan 12/06/2017shows continuing improvement in hypermetabolic lesions   (iv) PET scan 11/18/2016 shows continuing slight improvement. The left humeral fracture (initially documented 02/22/2015) shows continuing activity but a decreased soft tissue component.  (v) PET scan 04/26/2017 shows continuing improvement of the measurable nodular and bony disease  (b) pembrolizumab started 07/16/2015, repeated every 21 days  (i) increased TSH noted June 2018, corrected on Synthroid   PLAN: Adelisa is now more than 2 years out from definitive diagnosis of metastatic malignant melanoma.  She continues to benefit from pembrolizumab, with the most recent PET scan showing continuing response.  She is willing to continue the pembrolizumab and I think it is certainly in her interest to do so.  She is tolerating it well.  It is expensive and that is the main concern.  She has well controlled hypothyroidism with the most recent TSH pending.  While all this is going on, she is due for mammography and I have put in the orders for bilateral mammography with tomography and left breast ultrasonography.  If there is evidence of disease progression on anastrozole we will switch to a different antiestrogen  The plan then is to continue pembrolizumab every 3 weeks for another year.  Her daughter tells me Mikailah has been a little bit on the downside.  I suggested low-dose Remeron.  At this point they prefer to demur.  She will see me again in 3 months.  She knows to call for any problems that may develop before that visit.   Magrinat,  Virgie Dad, MD  06/08/17 2:26 PM Medical Oncology and Hematology [facility] [address] [place], [place] [address] Tel. [phone]    Fax. [phone]  This document serves as a record  of services personally performed by Lurline Del, MD. It was created on his behalf by Sheron Nightingale, a trained medical scribe. The creation of this record is based on the scribe's personal observations and the provider's statements to them.

## 2017-06-08 ENCOUNTER — Ambulatory Visit: Payer: Medicare HMO

## 2017-06-08 ENCOUNTER — Inpatient Hospital Stay: Payer: Medicare HMO | Attending: Oncology

## 2017-06-08 ENCOUNTER — Telehealth: Payer: Self-pay | Admitting: Oncology

## 2017-06-08 ENCOUNTER — Inpatient Hospital Stay (HOSPITAL_BASED_OUTPATIENT_CLINIC_OR_DEPARTMENT_OTHER): Payer: Medicare HMO | Admitting: Oncology

## 2017-06-08 ENCOUNTER — Inpatient Hospital Stay: Payer: Medicare HMO

## 2017-06-08 VITALS — BP 106/55 | HR 72 | Temp 97.5°F | Resp 16 | Ht 64.0 in | Wt 118.2 lb

## 2017-06-08 DIAGNOSIS — Z5112 Encounter for antineoplastic immunotherapy: Secondary | ICD-10-CM | POA: Diagnosis not present

## 2017-06-08 DIAGNOSIS — C50212 Malignant neoplasm of upper-inner quadrant of left female breast: Secondary | ICD-10-CM

## 2017-06-08 DIAGNOSIS — C773 Secondary and unspecified malignant neoplasm of axilla and upper limb lymph nodes: Secondary | ICD-10-CM

## 2017-06-08 DIAGNOSIS — C787 Secondary malignant neoplasm of liver and intrahepatic bile duct: Secondary | ICD-10-CM

## 2017-06-08 DIAGNOSIS — C439 Malignant melanoma of skin, unspecified: Secondary | ICD-10-CM

## 2017-06-08 DIAGNOSIS — C438 Malignant melanoma of overlapping sites of skin: Secondary | ICD-10-CM

## 2017-06-08 DIAGNOSIS — M810 Age-related osteoporosis without current pathological fracture: Secondary | ICD-10-CM | POA: Insufficient documentation

## 2017-06-08 DIAGNOSIS — E039 Hypothyroidism, unspecified: Secondary | ICD-10-CM

## 2017-06-08 DIAGNOSIS — Z170 Estrogen receptor positive status [ER+]: Secondary | ICD-10-CM | POA: Diagnosis not present

## 2017-06-08 DIAGNOSIS — C4359 Malignant melanoma of other part of trunk: Secondary | ICD-10-CM

## 2017-06-08 DIAGNOSIS — Z79899 Other long term (current) drug therapy: Secondary | ICD-10-CM | POA: Diagnosis not present

## 2017-06-08 DIAGNOSIS — C50919 Malignant neoplasm of unspecified site of unspecified female breast: Secondary | ICD-10-CM

## 2017-06-08 LAB — CBC WITH DIFFERENTIAL/PLATELET
Abs Granulocyte: 5.8 10*3/uL (ref 1.5–6.5)
Basophils Absolute: 0.1 10*3/uL (ref 0.0–0.1)
Basophils Relative: 1 %
Eosinophils Absolute: 0 10*3/uL (ref 0.0–0.5)
Eosinophils Relative: 0 %
HCT: 36.3 % (ref 34.8–46.6)
HEMOGLOBIN: 11.9 g/dL (ref 11.6–15.9)
LYMPHS ABS: 0.9 10*3/uL (ref 0.9–3.3)
Lymphocytes Relative: 12 %
MCH: 27.7 pg (ref 25.1–34.0)
MCHC: 32.9 g/dL (ref 31.5–36.0)
MCV: 84.1 fL (ref 79.5–101.0)
MONO ABS: 0.7 10*3/uL (ref 0.1–0.9)
MONOS PCT: 9 %
Neutro Abs: 5.8 10*3/uL (ref 1.5–6.5)
Neutrophils Relative %: 78 %
Platelets: 309 10*3/uL (ref 145–400)
RBC: 4.31 MIL/uL (ref 3.70–5.45)
RDW: 13.5 % (ref 11.2–16.1)
WBC: 7.5 10*3/uL (ref 3.9–10.3)

## 2017-06-08 LAB — COMPREHENSIVE METABOLIC PANEL
ALT: 13 U/L (ref 0–55)
AST: 21 U/L (ref 5–34)
Albumin: 3.7 g/dL (ref 3.5–5.0)
Alkaline Phosphatase: 56 U/L (ref 40–150)
Anion gap: 11 (ref 3–11)
BILIRUBIN TOTAL: 0.3 mg/dL (ref 0.2–1.2)
BUN: 28 mg/dL — ABNORMAL HIGH (ref 7–26)
CALCIUM: 9.5 mg/dL (ref 8.4–10.4)
CO2: 23 mmol/L (ref 22–29)
Chloride: 106 mmol/L (ref 98–109)
Creatinine, Ser: 0.94 mg/dL (ref 0.60–1.10)
GFR, EST NON AFRICAN AMERICAN: 52 mL/min — AB (ref >60)
GLUCOSE: 108 mg/dL (ref 70–140)
POTASSIUM: 3.8 mmol/L (ref 3.3–4.7)
Sodium: 140 mmol/L (ref 136–145)
TOTAL PROTEIN: 7 g/dL (ref 6.4–8.3)

## 2017-06-08 MED ORDER — SODIUM CHLORIDE 0.9 % IV SOLN
2.0000 mg/kg | Freq: Once | INTRAVENOUS | Status: AC
  Administered 2017-06-08: 100 mg via INTRAVENOUS
  Filled 2017-06-08: qty 4

## 2017-06-08 MED ORDER — SODIUM CHLORIDE 0.9 % IV SOLN
Freq: Once | INTRAVENOUS | Status: AC
  Administered 2017-06-08: 15:00:00 via INTRAVENOUS

## 2017-06-08 NOTE — Telephone Encounter (Signed)
Gave patient AVs and calendar of upcoming January through April appointments.

## 2017-06-08 NOTE — Patient Instructions (Signed)
[facility] Discharge Instructions for Patients Receiving Chemotherapy  Today you received the following chemotherapy agents: Keytruda   To help prevent nausea and vomiting after your treatment, we encourage you to take your nausea medication as directed  If you develop nausea and vomiting that is not controlled by your nausea medication, call the clinic.   BELOW ARE SYMPTOMS THAT SHOULD BE REPORTED IMMEDIATELY:  *FEVER GREATER THAN 100.5 F  *CHILLS WITH OR WITHOUT FEVER  NAUSEA AND VOMITING THAT IS NOT CONTROLLED WITH YOUR NAUSEA MEDICATION  *UNUSUAL SHORTNESS OF BREATH  *UNUSUAL BRUISING OR BLEEDING  TENDERNESS IN MOUTH AND THROAT WITH OR WITHOUT PRESENCE OF ULCERS  *URINARY PROBLEMS  *BOWEL PROBLEMS  UNUSUAL RASH Items with * indicate a potential emergency and should be followed up as soon as possible.  Feel free to call the clinic you have any questions or concerns. The clinic phone number is [phone].  

## 2017-06-29 ENCOUNTER — Inpatient Hospital Stay: Payer: Medicare HMO

## 2017-06-29 VITALS — BP 142/56 | HR 75 | Temp 98.0°F | Resp 17 | Wt 116.5 lb

## 2017-06-29 DIAGNOSIS — C438 Malignant melanoma of overlapping sites of skin: Secondary | ICD-10-CM

## 2017-06-29 DIAGNOSIS — C50212 Malignant neoplasm of upper-inner quadrant of left female breast: Secondary | ICD-10-CM

## 2017-06-29 DIAGNOSIS — C439 Malignant melanoma of skin, unspecified: Secondary | ICD-10-CM

## 2017-06-29 DIAGNOSIS — C787 Secondary malignant neoplasm of liver and intrahepatic bile duct: Secondary | ICD-10-CM

## 2017-06-29 DIAGNOSIS — Z170 Estrogen receptor positive status [ER+]: Secondary | ICD-10-CM

## 2017-06-29 LAB — COMPREHENSIVE METABOLIC PANEL
ALBUMIN: 3.8 g/dL (ref 3.5–5.0)
ALK PHOS: 60 U/L (ref 40–150)
ALT: 12 U/L (ref 0–55)
AST: 27 U/L (ref 5–34)
Anion gap: 12 — ABNORMAL HIGH (ref 3–11)
BUN: 22 mg/dL (ref 7–26)
CALCIUM: 9.6 mg/dL (ref 8.4–10.4)
CHLORIDE: 103 mmol/L (ref 98–109)
CO2: 22 mmol/L (ref 22–29)
CREATININE: 0.86 mg/dL (ref 0.60–1.10)
GFR calc non Af Amer: 58 mL/min — ABNORMAL LOW (ref >60)
GLUCOSE: 97 mg/dL (ref 70–140)
Potassium: 4.6 mmol/L (ref 3.3–4.7)
Sodium: 137 mmol/L (ref 136–145)
Total Bilirubin: 0.4 mg/dL (ref 0.2–1.2)
Total Protein: 7.1 g/dL (ref 6.4–8.3)

## 2017-06-29 LAB — CBC WITH DIFFERENTIAL/PLATELET
BASOS PCT: 1 %
Basophils Absolute: 0.1 10*3/uL (ref 0.0–0.1)
EOS ABS: 0 10*3/uL (ref 0.0–0.5)
Eosinophils Relative: 0 %
HEMATOCRIT: 37.2 % (ref 34.8–46.6)
Hemoglobin: 12.2 g/dL (ref 11.6–15.9)
LYMPHS ABS: 0.7 10*3/uL — AB (ref 0.9–3.3)
Lymphocytes Relative: 8 %
MCH: 27.7 pg (ref 25.1–34.0)
MCHC: 32.9 g/dL (ref 31.5–36.0)
MCV: 84.1 fL (ref 79.5–101.0)
MONO ABS: 0.6 10*3/uL (ref 0.1–0.9)
MONOS PCT: 7 %
NEUTROS PCT: 84 %
Neutro Abs: 7 10*3/uL — ABNORMAL HIGH (ref 1.5–6.5)
Platelets: 275 10*3/uL (ref 145–400)
RBC: 4.43 MIL/uL (ref 3.70–5.45)
RDW: 13.4 % (ref 11.2–16.1)
WBC: 8.4 10*3/uL (ref 3.9–10.3)

## 2017-06-29 LAB — TSH: TSH: 0.515 u[IU]/mL (ref 0.308–3.960)

## 2017-06-29 LAB — LACTATE DEHYDROGENASE: LDH: 179 U/L (ref 125–245)

## 2017-06-29 MED ORDER — SODIUM CHLORIDE 0.9 % IV SOLN
2.0000 mg/kg | Freq: Once | INTRAVENOUS | Status: AC
  Administered 2017-06-29: 100 mg via INTRAVENOUS
  Filled 2017-06-29: qty 4

## 2017-06-29 NOTE — Patient Instructions (Signed)
[facility] Discharge Instructions for Patients Receiving Chemotherapy  Today you received the following chemotherapy agents:  Keytruda.  To help prevent nausea and vomiting after your treatment, we encourage you to take your nausea medication as directed.   If you develop nausea and vomiting that is not controlled by your nausea medication, call the clinic.   BELOW ARE SYMPTOMS THAT SHOULD BE REPORTED IMMEDIATELY:  *FEVER GREATER THAN 100.5 F  *CHILLS WITH OR WITHOUT FEVER  NAUSEA AND VOMITING THAT IS NOT CONTROLLED WITH YOUR NAUSEA MEDICATION  *UNUSUAL SHORTNESS OF BREATH  *UNUSUAL BRUISING OR BLEEDING  TENDERNESS IN MOUTH AND THROAT WITH OR WITHOUT PRESENCE OF ULCERS  *URINARY PROBLEMS  *BOWEL PROBLEMS  UNUSUAL RASH Items with * indicate a potential emergency and should be followed up as soon as possible.  Feel free to call the clinic should you have any questions or concerns. The clinic phone number is [phone].  Please show the CHEMO ALERT CARD at check-in to the Emergency Department and triage nurse.    

## 2017-07-05 ENCOUNTER — Other Ambulatory Visit: Payer: Self-pay | Admitting: Oncology

## 2017-07-20 ENCOUNTER — Inpatient Hospital Stay: Payer: Medicare HMO | Attending: Oncology

## 2017-07-20 ENCOUNTER — Inpatient Hospital Stay: Payer: Medicare HMO

## 2017-07-20 VITALS — BP 168/68 | HR 73 | Temp 97.6°F | Resp 18

## 2017-07-20 DIAGNOSIS — Z5112 Encounter for antineoplastic immunotherapy: Secondary | ICD-10-CM | POA: Insufficient documentation

## 2017-07-20 DIAGNOSIS — M8000XG Age-related osteoporosis with current pathological fracture, unspecified site, subsequent encounter for fracture with delayed healing: Secondary | ICD-10-CM

## 2017-07-20 DIAGNOSIS — C4359 Malignant melanoma of other part of trunk: Secondary | ICD-10-CM | POA: Diagnosis present

## 2017-07-20 DIAGNOSIS — C438 Malignant melanoma of overlapping sites of skin: Secondary | ICD-10-CM

## 2017-07-20 DIAGNOSIS — M810 Age-related osteoporosis without current pathological fracture: Secondary | ICD-10-CM | POA: Diagnosis present

## 2017-07-20 DIAGNOSIS — C439 Malignant melanoma of skin, unspecified: Secondary | ICD-10-CM

## 2017-07-20 LAB — CBC WITH DIFFERENTIAL/PLATELET
BASOS ABS: 0 10*3/uL (ref 0.0–0.1)
BASOS PCT: 1 %
Eosinophils Absolute: 0 10*3/uL (ref 0.0–0.5)
Eosinophils Relative: 0 %
HEMATOCRIT: 36.3 % (ref 34.8–46.6)
HEMOGLOBIN: 11.9 g/dL (ref 11.6–15.9)
Lymphocytes Relative: 13 %
Lymphs Abs: 0.9 10*3/uL (ref 0.9–3.3)
MCH: 27.8 pg (ref 25.1–34.0)
MCHC: 32.8 g/dL (ref 31.5–36.0)
MCV: 84.8 fL (ref 79.5–101.0)
Monocytes Absolute: 0.7 10*3/uL (ref 0.1–0.9)
Monocytes Relative: 10 %
NEUTROS ABS: 5.1 10*3/uL (ref 1.5–6.5)
NEUTROS PCT: 76 %
Platelets: 270 10*3/uL (ref 145–400)
RBC: 4.28 MIL/uL (ref 3.70–5.45)
RDW: 13.1 % (ref 11.2–14.5)
WBC: 6.7 10*3/uL (ref 3.9–10.3)

## 2017-07-20 LAB — COMPREHENSIVE METABOLIC PANEL
ALK PHOS: 62 U/L (ref 40–150)
ALT: 10 U/L (ref 0–55)
ANION GAP: 11 (ref 3–11)
AST: 22 U/L (ref 5–34)
Albumin: 3.6 g/dL (ref 3.5–5.0)
BUN: 27 mg/dL — ABNORMAL HIGH (ref 7–26)
CALCIUM: 9.8 mg/dL (ref 8.4–10.4)
CHLORIDE: 103 mmol/L (ref 98–109)
CO2: 25 mmol/L (ref 22–29)
CREATININE: 0.86 mg/dL (ref 0.60–1.10)
GFR, EST NON AFRICAN AMERICAN: 58 mL/min — AB (ref >60)
Glucose, Bld: 95 mg/dL (ref 70–140)
Potassium: 3.6 mmol/L (ref 3.5–5.1)
Sodium: 139 mmol/L (ref 136–145)
Total Bilirubin: 0.3 mg/dL (ref 0.2–1.2)
Total Protein: 7 g/dL (ref 6.4–8.3)

## 2017-07-20 MED ORDER — DENOSUMAB 60 MG/ML ~~LOC~~ SOLN
60.0000 mg | Freq: Once | SUBCUTANEOUS | Status: AC
  Administered 2017-07-20: 60 mg via SUBCUTANEOUS

## 2017-07-20 MED ORDER — DENOSUMAB 60 MG/ML ~~LOC~~ SOLN
SUBCUTANEOUS | Status: AC
  Filled 2017-07-20: qty 1

## 2017-07-20 MED ORDER — SODIUM CHLORIDE 0.9 % IV SOLN: Freq: Once | INTRAVENOUS | Status: DC

## 2017-07-20 MED ORDER — SODIUM CHLORIDE 0.9 % IV SOLN
2.0000 mg/kg | Freq: Once | INTRAVENOUS | Status: AC
  Administered 2017-07-20: 100 mg via INTRAVENOUS
  Filled 2017-07-20: qty 4

## 2017-08-03 ENCOUNTER — Telehealth: Payer: Self-pay

## 2017-08-03 NOTE — Telephone Encounter (Signed)
Pt's daughter left a voice mail regarding needing appt for patient due to loss of brother, passed away recently and not coping well and having GI issues- she would like doctor appt as her future appts scheduled are for lab/infusion only.  Notified  Wilber Bihari NP and she can see pt tomorrow.  Attempted to reach daughter ( her number disconnected) but was able to leave a VM at pt's number.

## 2017-08-10 ENCOUNTER — Inpatient Hospital Stay: Payer: Medicare HMO | Attending: Oncology

## 2017-08-10 ENCOUNTER — Inpatient Hospital Stay: Payer: Medicare HMO

## 2017-08-10 VITALS — BP 101/40 | HR 68 | Temp 97.8°F | Resp 18

## 2017-08-10 DIAGNOSIS — C439 Malignant melanoma of skin, unspecified: Secondary | ICD-10-CM

## 2017-08-10 DIAGNOSIS — Z170 Estrogen receptor positive status [ER+]: Secondary | ICD-10-CM

## 2017-08-10 DIAGNOSIS — C4359 Malignant melanoma of other part of trunk: Secondary | ICD-10-CM | POA: Insufficient documentation

## 2017-08-10 DIAGNOSIS — Z79899 Other long term (current) drug therapy: Secondary | ICD-10-CM | POA: Insufficient documentation

## 2017-08-10 DIAGNOSIS — Z5112 Encounter for antineoplastic immunotherapy: Secondary | ICD-10-CM | POA: Insufficient documentation

## 2017-08-10 DIAGNOSIS — C50212 Malignant neoplasm of upper-inner quadrant of left female breast: Secondary | ICD-10-CM

## 2017-08-10 DIAGNOSIS — C787 Secondary malignant neoplasm of liver and intrahepatic bile duct: Secondary | ICD-10-CM

## 2017-08-10 DIAGNOSIS — C438 Malignant melanoma of overlapping sites of skin: Secondary | ICD-10-CM

## 2017-08-10 LAB — COMPREHENSIVE METABOLIC PANEL
ALBUMIN: 3.6 g/dL (ref 3.5–5.0)
ALK PHOS: 60 U/L (ref 40–150)
ALT: 13 U/L (ref 0–55)
AST: 22 U/L (ref 5–34)
Anion gap: 9 (ref 3–11)
BILIRUBIN TOTAL: 0.3 mg/dL (ref 0.2–1.2)
BUN: 26 mg/dL (ref 7–26)
CALCIUM: 9.2 mg/dL (ref 8.4–10.4)
CO2: 25 mmol/L (ref 22–29)
CREATININE: 0.83 mg/dL (ref 0.60–1.10)
Chloride: 106 mmol/L (ref 98–109)
GFR calc non Af Amer: >60 mL/min (ref >60)
GLUCOSE: 95 mg/dL (ref 70–140)
Potassium: 4 mmol/L (ref 3.5–5.1)
SODIUM: 140 mmol/L (ref 136–145)
TOTAL PROTEIN: 6.9 g/dL (ref 6.4–8.3)

## 2017-08-10 LAB — CBC WITH DIFFERENTIAL/PLATELET
BASOS ABS: 0.1 10*3/uL (ref 0.0–0.1)
BASOS PCT: 1 %
EOS ABS: 0 10*3/uL (ref 0.0–0.5)
Eosinophils Relative: 0 %
HCT: 36.4 % (ref 34.8–46.6)
Hemoglobin: 12.1 g/dL (ref 11.6–15.9)
Lymphocytes Relative: 9 %
Lymphs Abs: 0.8 10*3/uL — ABNORMAL LOW (ref 0.9–3.3)
MCH: 27.4 pg (ref 25.1–34.0)
MCHC: 33.2 g/dL (ref 31.5–36.0)
MCV: 82.5 fL (ref 79.5–101.0)
MONO ABS: 0.6 10*3/uL (ref 0.1–0.9)
MONOS PCT: 7 %
Neutro Abs: 7 10*3/uL — ABNORMAL HIGH (ref 1.5–6.5)
Neutrophils Relative %: 83 %
Platelets: 325 10*3/uL (ref 145–400)
RBC: 4.41 MIL/uL (ref 3.70–5.45)
RDW: 13.6 % (ref 11.2–14.5)
WBC: 8.5 10*3/uL (ref 3.9–10.3)

## 2017-08-10 LAB — TSH: TSH: 0.692 u[IU]/mL (ref 0.308–3.960)

## 2017-08-10 LAB — LACTATE DEHYDROGENASE: LDH: 177 U/L (ref 125–245)

## 2017-08-10 MED ORDER — SODIUM CHLORIDE 0.9 % IV SOLN
2.0000 mg/kg | Freq: Once | INTRAVENOUS | Status: AC
  Administered 2017-08-10: 100 mg via INTRAVENOUS
  Filled 2017-08-10: qty 4

## 2017-08-10 MED ORDER — SODIUM CHLORIDE 0.9 % IV SOLN
Freq: Once | INTRAVENOUS | Status: AC
  Administered 2017-08-10: 15:00:00 via INTRAVENOUS

## 2017-08-10 NOTE — Patient Instructions (Signed)
[facility] Discharge Instructions for Patients Receiving Chemotherapy  Today you received the following chemotherapy agents:  Keytruda.  To help prevent nausea and vomiting after your treatment, we encourage you to take your nausea medication as directed.   If you develop nausea and vomiting that is not controlled by your nausea medication, call the clinic.   BELOW ARE SYMPTOMS THAT SHOULD BE REPORTED IMMEDIATELY:  *FEVER GREATER THAN 100.5 F  *CHILLS WITH OR WITHOUT FEVER  NAUSEA AND VOMITING THAT IS NOT CONTROLLED WITH YOUR NAUSEA MEDICATION  *UNUSUAL SHORTNESS OF BREATH  *UNUSUAL BRUISING OR BLEEDING  TENDERNESS IN MOUTH AND THROAT WITH OR WITHOUT PRESENCE OF ULCERS  *URINARY PROBLEMS  *BOWEL PROBLEMS  UNUSUAL RASH Items with * indicate a potential emergency and should be followed up as soon as possible.  Feel free to call the clinic should you have any questions or concerns. The clinic phone number is [phone].  Please show the CHEMO ALERT CARD at check-in to the Emergency Department and triage nurse.    

## 2017-08-15 ENCOUNTER — Other Ambulatory Visit: Payer: Self-pay | Admitting: Oncology

## 2017-08-31 ENCOUNTER — Inpatient Hospital Stay (HOSPITAL_BASED_OUTPATIENT_CLINIC_OR_DEPARTMENT_OTHER): Payer: Medicare HMO | Admitting: Adult Health

## 2017-08-31 ENCOUNTER — Encounter: Payer: Self-pay | Admitting: Adult Health

## 2017-08-31 ENCOUNTER — Inpatient Hospital Stay: Payer: Medicare HMO | Attending: Oncology

## 2017-08-31 ENCOUNTER — Inpatient Hospital Stay: Payer: Medicare HMO

## 2017-08-31 VITALS — BP 133/61 | HR 71 | Temp 97.4°F | Resp 18 | Ht 64.0 in | Wt 116.2 lb

## 2017-08-31 DIAGNOSIS — C787 Secondary malignant neoplasm of liver and intrahepatic bile duct: Secondary | ICD-10-CM

## 2017-08-31 DIAGNOSIS — Z170 Estrogen receptor positive status [ER+]: Secondary | ICD-10-CM

## 2017-08-31 DIAGNOSIS — C438 Malignant melanoma of overlapping sites of skin: Secondary | ICD-10-CM

## 2017-08-31 DIAGNOSIS — C4359 Malignant melanoma of other part of trunk: Secondary | ICD-10-CM | POA: Insufficient documentation

## 2017-08-31 DIAGNOSIS — C773 Secondary and unspecified malignant neoplasm of axilla and upper limb lymph nodes: Secondary | ICD-10-CM

## 2017-08-31 DIAGNOSIS — C50212 Malignant neoplasm of upper-inner quadrant of left female breast: Secondary | ICD-10-CM | POA: Insufficient documentation

## 2017-08-31 DIAGNOSIS — Z5112 Encounter for antineoplastic immunotherapy: Secondary | ICD-10-CM | POA: Diagnosis not present

## 2017-08-31 DIAGNOSIS — Z79899 Other long term (current) drug therapy: Secondary | ICD-10-CM | POA: Diagnosis not present

## 2017-08-31 DIAGNOSIS — Z79811 Long term (current) use of aromatase inhibitors: Secondary | ICD-10-CM | POA: Diagnosis not present

## 2017-08-31 DIAGNOSIS — C439 Malignant melanoma of skin, unspecified: Secondary | ICD-10-CM

## 2017-08-31 LAB — COMPREHENSIVE METABOLIC PANEL
ALBUMIN: 3.7 g/dL (ref 3.5–5.0)
ALT: 15 U/L (ref 0–55)
AST: 22 U/L (ref 5–34)
Alkaline Phosphatase: 58 U/L (ref 40–150)
Anion gap: 10 (ref 3–11)
BILIRUBIN TOTAL: 0.3 mg/dL (ref 0.2–1.2)
BUN: 26 mg/dL (ref 7–26)
CO2: 26 mmol/L (ref 22–29)
Calcium: 10.3 mg/dL (ref 8.4–10.4)
Chloride: 103 mmol/L (ref 98–109)
Creatinine, Ser: 0.86 mg/dL (ref 0.60–1.10)
GFR calc Af Amer: >60 mL/min (ref >60)
GFR calc non Af Amer: 58 mL/min — ABNORMAL LOW (ref >60)
Glucose, Bld: 113 mg/dL (ref 70–140)
POTASSIUM: 4.1 mmol/L (ref 3.5–5.1)
Sodium: 139 mmol/L (ref 136–145)
TOTAL PROTEIN: 7.1 g/dL (ref 6.4–8.3)

## 2017-08-31 LAB — CBC WITH DIFFERENTIAL/PLATELET
BASOS ABS: 0.1 10*3/uL (ref 0.0–0.1)
Basophils Relative: 1 %
Eosinophils Absolute: 0 10*3/uL (ref 0.0–0.5)
Eosinophils Relative: 0 %
HCT: 36.4 % (ref 34.8–46.6)
Hemoglobin: 11.8 g/dL (ref 11.6–15.9)
LYMPHS PCT: 10 %
Lymphs Abs: 0.8 10*3/uL — ABNORMAL LOW (ref 0.9–3.3)
MCH: 27 pg (ref 25.1–34.0)
MCHC: 32.5 g/dL (ref 31.5–36.0)
MCV: 83.2 fL (ref 79.5–101.0)
Monocytes Absolute: 0.7 10*3/uL (ref 0.1–0.9)
Monocytes Relative: 9 %
NEUTROS ABS: 6.2 10*3/uL (ref 1.5–6.5)
Neutrophils Relative %: 80 %
PLATELETS: 340 10*3/uL (ref 145–400)
RBC: 4.38 MIL/uL (ref 3.70–5.45)
RDW: 13.8 % (ref 11.2–14.5)
WBC: 7.7 10*3/uL (ref 3.9–10.3)

## 2017-08-31 MED ORDER — SODIUM CHLORIDE 0.9 % IV SOLN
Freq: Once | INTRAVENOUS | Status: AC
  Administered 2017-08-31: 16:00:00 via INTRAVENOUS

## 2017-08-31 MED ORDER — SODIUM CHLORIDE 0.9 % IV SOLN
2.0000 mg/kg | Freq: Once | INTRAVENOUS | Status: AC
  Administered 2017-08-31: 100 mg via INTRAVENOUS
  Filled 2017-08-31: qty 4

## 2017-08-31 NOTE — Patient Instructions (Signed)
[facility] Discharge Instructions for Patients Receiving Chemotherapy  Today you received the following chemotherapy agents:  Keytruda.  To help prevent nausea and vomiting after your treatment, we encourage you to take your nausea medication as directed.   If you develop nausea and vomiting that is not controlled by your nausea medication, call the clinic.   BELOW ARE SYMPTOMS THAT SHOULD BE REPORTED IMMEDIATELY:  *FEVER GREATER THAN 100.5 F  *CHILLS WITH OR WITHOUT FEVER  NAUSEA AND VOMITING THAT IS NOT CONTROLLED WITH YOUR NAUSEA MEDICATION  *UNUSUAL SHORTNESS OF BREATH  *UNUSUAL BRUISING OR BLEEDING  TENDERNESS IN MOUTH AND THROAT WITH OR WITHOUT PRESENCE OF ULCERS  *URINARY PROBLEMS  *BOWEL PROBLEMS  UNUSUAL RASH Items with * indicate a potential emergency and should be followed up as soon as possible.  Feel free to call the clinic should you have any questions or concerns. The clinic phone number is [phone].  Please show the CHEMO ALERT CARD at check-in to the Emergency Department and triage nurse.    

## 2017-08-31 NOTE — Progress Notes (Signed)
[place]  [phone] [phone]     ID: Danielle [name] DOB: 03-14-1926  MR#: [id number]  IHW#:[id number]  Patient Care Team: Danielle Bill, MD as PCP - General (Family Medicine) Danielle Gemma, MD as Consulting Physician (General Surgery) [name], Danielle Dad, MD as Consulting Physician (Oncology) Danielle Kenner, MD (Dermatology) Danielle Arabian, MD as Consulting Physician (Orthopedic Surgery) PCP: Danielle Bill, MD GYN: OTHER MD:  CHIEF COMPLAINT: Estrogen receptor positive breast cancer; stage IV malignant melanoma  CURRENT TREATMENT: Anastrozole, pembrolizumab   BREAST CANCER HISTORY: From the original intake note:  Danielle [name] she had an itchy spot in the middle of her back which she scratched sometimes, but this was not evaluated until [facility] fell off a chair while changing a light bulb and broke her left upper extremity. Because she couldn't change while in a sling, her daughter Danielle [name] had 2 give her a hand and in the process noted a black lesion in the middle of Danielle [name]'s back. She took married to Danielle. Juel [name] office where on 04/17/2015 he performed a shave biopsy of a pigmented lesion over the spine in the upper back. The pathology report ([id number]) showed a breast low depth of at least 6.5 mm, with the anatomic level at least 4. There were 23 mitoses per square millimeter and extensive ulceration. There was no satellitosis. There was no evidence of regression. They host response was not brisk. This was read as a pathology stage T4b and the patient was referred to Danielle [name] for wide excision.  However on exam Danielle. jerking noted bilateral axillary adenopathy and also a mass in the upper inner quadrant of the left breast. He set Danielle [name] up for bilateral mammography with tomography 06/06/2015, and this showed an irregular hyperdense mass in the left breast upper inner quadrant which was palpable. There was also a firm palpable mass in the left axilla as well as  in the contralateral, right axilla. Ultrasonography showed the left breast mass to measure 2.7 cm and at least 2 abnormally enlarged lymph nodes in the left axilla the larger one measuring 3.9 cm. In the right axilla there were also 2 abnormal appearing lymph nodes, the larger measuring 2.6 cm.  On 06/06/2015 Danielle [name] underwent biopsy of the left breast mass and both axillary adenopathy areas. The left breast mass proved to be an invasive ductal carcinoma , grade 2, estrogen and progesterone receptor positive, both at 100%, with strong staining intensity. HER-2 was not amplified, with a signals ratio of 1.42, the average copy number per cell being 2.55. The proliferation marker was 5%.  More importantly, both axillary lymph nodes showed metastatic melanoma. We were contacted and prior to today's visit we obtained a PET scan, which shows in addition to the left breast mass and the bilateral axillary lymph nodes (described as just deep to the latissimus dorsi muscles bilaterally) 2 hypermetabolic foci in the left lobe of the liver, measuring 1.3 and 0.8 cm, with an SUV max of 6.3. A 1.5 cm mass next to the left [name], clearly separate from the adrenal gland, is also hypermetabolic with an SUV max of 8.2. There were no lung or bone lesions.  Danielle [name] subsequent history is as detailed below   INTERVAL HISTORY: Danielle [name] returns today for follow-up and treatment of her malignant melanoma accompanied by her daughter. We also follow her for her breast cancer. She continues on pembrolizumab for the melanoma.   As for her breast cancer, she continues on anastrozole and tolerates this well.  REVIEW OF SYSTEMS: Danielle [name] is doing well today.  She tells me that she just celebrated her 84th birthday.  Her family threw her a huge party that was catered and family from all over traveled to celebrate her birthday.  She had an enjoyable time.  She tells me that she feels well.  She will sit in her recliner, and she has a timer that  will go off to signal her to walk around the house.  She used to get dizzy with Pembrolizumab, however that has resolved.  She denies any other issues or new pain.  A detailed ROS is otherwise non contributory today.  PAST MEDICAL HISTORY: No past medical history on file. Remote history of rectal prolapse with chronic constipation resulting, history of left humeral fracture, osteopenia  PAST SURGICAL HISTORY: Past Surgical History:  Procedure Laterality Date  . HIP SURGERY      FAMILY HISTORY No family history on file.  The patient's father died at the age of 59 in the setting of a call abuse. The patient's mother died from "old age" at 82. The patient had 4 brothers, no sisters. One brother died in his 44s from brain cancer. Another died from a stroke, and other from emphysema. The 32, youngest brother is 66 and jogs every day. There is no history of breast or ovarian cancer in the family. There is also no prior history of melanoma.  GYNECOLOGIC HISTORY:  No LMP recorded. Patient is postmenopausal. Menarche age 52, first live birth age 63, the patient is Danielle [name]. She stopped having periods in her early 72s. She did not use hormone replacement. She never took oral contraceptives.  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 She is widowed. She is now retired, and lives alone, with no pets. Her son, Danielle [name] lives in [place], and is a retired Optometrist. Son, Danielle [name] lives in [place], [place] and is a Chief of Staff. Daughter, Danielle [name] is a Marine scientist at [name] locally. The fourth child, Danielle [name], died in an automobile accident at age 72. The patient has 4 grandchildren. She has 1 great-grandchild, named Danielle [name], from her daughters, child. She is a Corporate treasurer. She attends services at [name].   ADVANCED DIRECTIVES: Not yet notarized; Danielle [name] intends to name her daughter Danielle [name] as her  healthcare power of attorney. She can be reached at [phone]   HEALTH MAINTENANCE: Social History   Tobacco Use  . Smoking status: Never Smoker  Substance Use Topics  . Alcohol use: No  . Drug use: No     Colonoscopy: Remote  PAP:  Bone density: Remote  Lipid panel:  Allergies  Allergen Reactions  . Vicodin [Hydrocodone-Acetaminophen] Nausea Only    Current Outpatient Medications  Medication Sig Dispense Refill  . anastrozole (ARIMIDEX) 1 MG tablet TAKE ONE TABLET (1 MG) BY MOUTH [name]. 90 tablet 2  . aspirin EC 81 MG tablet Take by mouth.    . cholecalciferol (VITAMIN D) 1000 units tablet Take 1 tablet (1,000 Units total) by mouth [name]. 100 tablet 4  . enalapril (VASOTEC) 10 MG tablet Take 1 tablet (10 mg total) by mouth [name]. 90 tablet 4  . levothyroxine (SYNTHROID, LEVOTHROID) 100 MCG tablet TAKE ONE TABLET BY MOUTH [name] BEFORE BREAKFAST 30 tablet 10  . Lutein 20 MG CAPS Take by mouth.    . Red Yeast Rice Extract (RED YEAST RICE PO) Take by mouth.    Marland Kitchen  triamterene-hydrochlorothiazide (DYAZIDE) 37.5-25 MG capsule Take 1 capsule by mouth [name].     . verapamil (CALAN-SR) 240 MG CR tablet   0   No current facility-administered medications for this visit.     OBJECTIVE:   Vitals:   08/31/17 1420  BP: 133/61  Pulse: 71  Resp: 18  Temp: (!) 97.4 F (36.3 C)  SpO2: 100%     Body mass index is 19.95 kg/m.    ECOG FS:1 - Symptomatic but completely ambulatory GENERAL: Patient is a well appearing older female in no acute distress HEENT:  Sclerae anicteric.  Oropharynx clear and moist. No ulcerations or evidence of oropharyngeal candidiasis. Neck is supple.  NODES:  No cervical, supraclavicular, or axillary lymphadenopathy palpated.  BREAST EXAM:  Deferred. LUNGS:  Clear to auscultation bilaterally.  No wheezes or rhonchi. HEART:  Regular rate and rhythm. No murmur appreciated. ABDOMEN:  Soft, nontender.  Positive, normoactive bowel sounds. No organomegaly  palpated. MSK:  No focal spinal tenderness to palpation. Full range of motion bilaterally in the upper extremities. EXTREMITIES:  No peripheral edema.   SKIN:  Clear with no obvious rashes or skin changes. No nail dyscrasia. NEURO:  Nonfocal. Well oriented.  Appropriate affect.   LAB RESULTS:  CMP     Component Value Date/Time   NA 139 08/31/2017 1357   NA 139 05/18/2017 1325   K 4.1 08/31/2017 1357   K 3.9 05/18/2017 1325   CL 103 08/31/2017 1357   CO2 26 08/31/2017 1357   CO2 23 05/18/2017 1325   GLUCOSE 113 08/31/2017 1357   GLUCOSE 90 05/18/2017 1325   BUN 26 08/31/2017 1357   BUN 24.8 05/18/2017 1325   CREATININE 0.86 08/31/2017 1357   CREATININE 0.8 05/18/2017 1325   CALCIUM 10.3 08/31/2017 1357   CALCIUM 9.7 05/18/2017 1325   PROT 7.1 08/31/2017 1357   PROT 7.0 05/18/2017 1325   ALBUMIN 3.7 08/31/2017 1357   ALBUMIN 3.8 05/18/2017 1325   AST 22 08/31/2017 1357   AST 22 05/18/2017 1325   ALT 15 08/31/2017 1357   ALT 11 05/18/2017 1325   ALKPHOS 58 08/31/2017 1357   ALKPHOS 55 05/18/2017 1325   BILITOT 0.3 08/31/2017 1357   BILITOT 0.40 05/18/2017 1325   GFRNONAA 58 (L) 08/31/2017 1357   GFRAA >60 08/31/2017 1357    INo results found for: SPEP, UPEP  Lab Results  Component Value Date   WBC 7.7 08/31/2017   NEUTROABS 6.2 08/31/2017   HGB 11.8 08/31/2017   HCT 36.4 08/31/2017   MCV 83.2 08/31/2017   PLT 340 08/31/2017      Chemistry      Component Value Date/Time   NA 139 08/31/2017 1357   NA 139 05/18/2017 1325   K 4.1 08/31/2017 1357   K 3.9 05/18/2017 1325   CL 103 08/31/2017 1357   CO2 26 08/31/2017 1357   CO2 23 05/18/2017 1325   BUN 26 08/31/2017 1357   BUN 24.8 05/18/2017 1325   CREATININE 0.86 08/31/2017 1357   CREATININE 0.8 05/18/2017 1325      Component Value Date/Time   CALCIUM 10.3 08/31/2017 1357   CALCIUM 9.7 05/18/2017 1325   ALKPHOS 58 08/31/2017 1357   ALKPHOS 55 05/18/2017 1325   AST 22 08/31/2017 1357   AST 22  05/18/2017 1325   ALT 15 08/31/2017 1357   ALT 11 05/18/2017 1325   BILITOT 0.3 08/31/2017 1357   BILITOT 0.40 05/18/2017 1325       No results  found for: LABCA2  No components found for: [id number]  No results for input(s): INR in the last 168 hours.  Urinalysis No results found for: COLORURINE, APPEARANCEUR, LABSPEC, PHURINE, GLUCOSEU, HGBUR, BILIRUBINUR, KETONESUR, PROTEINUR, UROBILINOGEN, NITRITE, LEUKOCYTESUR  STUDIES: She completed a PET scan on 04/26/2017 showing: Generally mild to moderate improvement in the prior osseous and nodal metastatic lesions. Branching nodular density in the right lower lobe is stable in size, maximum SUV 1.4 (formerly 1.9). Other imaging findings of potential clinical significance: Dense mitral calcification. Aortic Atherosclerosis (ICD10-I70.0). Coronary atherosclerosis. Diffuse colonic diverticulosis. Cholelithiasis. Renal cysts including a complex left renal cyst.  ASSESSMENT: 84 y.o. [place] woman with concurrent breast cancer and melanoma  BREAST CANCER: (1) left breast upper inner quadrant biopsy 06/06/2015 shows a clinical T2 NX, stage II invasive ductal carcinoma , grade 2, estrogen receptor and progesterone receptor strongly positive, with an MIB-1 of 5%, and no HER-2 amplification, the signals ratio being 1.42 and the number per cell 2.55.   (2) anastrozole started 07/01/2015  (a) bone density February 2017 shows osteoporosis with a T score of -2.5  (b) mammography and ultrasonography 06/23/2016 shows continuing response  MALIGNANT MELANOMA: (a) shave biopsy from the mid upper back 04/17/2015 shows invasive malignant melanoma, Breslow depth 6.5 mm plus, Clark's level 4+, with 23 mitoses per square millimeter, extensive ulceration,, with involved edges, and non-brisk host response  (i) biopsy of right and left axillary lymph nodes 06/06/2015 showed metastatic melanoma, BRAF wild type  (ii) PET scan 07/01/2015 shows bilateral axillary  adenopathy, a left latissimus mass, a left pararenal mass, and 2 liver lesions  (iii) PET scan 12/06/2017shows continuing improvement in hypermetabolic lesions   (iv) PET scan 11/18/2016 shows continuing slight improvement. The left humeral fracture (initially documented 02/22/2015) shows continuing activity but a decreased soft tissue component.  (v) PET scan 04/26/2017 shows continuing improvement of the measurable nodular and bony disease  (b) pembrolizumab started 07/16/2015, repeated every 21 days  (i) increased TSH noted June 2018, corrected on Synthroid   PLAN:  Savanna is doing well today.  I reviewed her lab work with her today in detail which is normal.  She is tolerating treatment well.  She will proceed with Pembrolizumab.  She will continue on Anastrozole [name].  We called the breast center for them to schedule her repeat mammogram and ultrasound.  She is due for PET in June.  I requested this be prior to her appointment with Danielle. Jana Hakim.  She will return every 3 weeks for labs and Pembrolizmab.  Danielle. Jana Hakim will see her back in June.    She knows to call for any problems that may develop before that visit.  A total of (20) minutes of face-to-face time was spent with this patient with greater than 50% of that time in counseling and care-coordination.  Wilber Bihari, NP  08/31/17 2:49 PM Medical Oncology and Hematology [facility] [address] [place], [place] [address] Tel. [phone]    Fax. [phone]

## 2017-09-20 ENCOUNTER — Telehealth: Payer: Self-pay | Admitting: Oncology

## 2017-09-20 NOTE — Telephone Encounter (Signed)
R/s appt from 4/23 to 4/24 per patient  request - unable to come in 4/23 - patient daughter is aware of time and date.

## 2017-09-21 ENCOUNTER — Other Ambulatory Visit: Payer: Medicare HMO

## 2017-09-21 ENCOUNTER — Ambulatory Visit: Payer: Medicare HMO

## 2017-09-22 ENCOUNTER — Inpatient Hospital Stay: Payer: Medicare HMO

## 2017-09-22 VITALS — BP 138/52 | HR 59 | Temp 97.7°F | Resp 18 | Wt 115.5 lb

## 2017-09-22 DIAGNOSIS — C50212 Malignant neoplasm of upper-inner quadrant of left female breast: Secondary | ICD-10-CM

## 2017-09-22 DIAGNOSIS — Z170 Estrogen receptor positive status [ER+]: Secondary | ICD-10-CM

## 2017-09-22 DIAGNOSIS — C438 Malignant melanoma of overlapping sites of skin: Secondary | ICD-10-CM

## 2017-09-22 DIAGNOSIS — C787 Secondary malignant neoplasm of liver and intrahepatic bile duct: Secondary | ICD-10-CM

## 2017-09-22 DIAGNOSIS — Z5112 Encounter for antineoplastic immunotherapy: Secondary | ICD-10-CM | POA: Diagnosis not present

## 2017-09-22 DIAGNOSIS — C439 Malignant melanoma of skin, unspecified: Secondary | ICD-10-CM

## 2017-09-22 LAB — COMPREHENSIVE METABOLIC PANEL
ALT: 12 U/L (ref 0–55)
ANION GAP: 12 — AB (ref 3–11)
AST: 18 U/L (ref 5–34)
Albumin: 3.7 g/dL (ref 3.5–5.0)
Alkaline Phosphatase: 53 U/L (ref 40–150)
BUN: 23 mg/dL (ref 7–26)
CHLORIDE: 103 mmol/L (ref 98–109)
CO2: 23 mmol/L (ref 22–29)
Calcium: 11 mg/dL — ABNORMAL HIGH (ref 8.4–10.4)
Creatinine, Ser: 0.95 mg/dL (ref 0.60–1.10)
GFR calc Af Amer: 59 mL/min — ABNORMAL LOW (ref >60)
GFR calc non Af Amer: 51 mL/min — ABNORMAL LOW (ref >60)
Glucose, Bld: 111 mg/dL (ref 70–140)
POTASSIUM: 4.4 mmol/L (ref 3.5–5.1)
SODIUM: 138 mmol/L (ref 136–145)
Total Bilirubin: 0.3 mg/dL (ref 0.2–1.2)
Total Protein: 6.7 g/dL (ref 6.4–8.3)

## 2017-09-22 LAB — CBC WITH DIFFERENTIAL/PLATELET
Basophils Absolute: 0.1 10*3/uL (ref 0.0–0.1)
Basophils Relative: 1 %
Eosinophils Absolute: 0 10*3/uL (ref 0.0–0.5)
Eosinophils Relative: 0 %
HCT: 35.6 % (ref 34.8–46.6)
HEMOGLOBIN: 11.8 g/dL (ref 11.6–15.9)
LYMPHS ABS: 0.7 10*3/uL — AB (ref 0.9–3.3)
LYMPHS PCT: 10 %
MCH: 27.4 pg (ref 25.1–34.0)
MCHC: 33.2 g/dL (ref 31.5–36.0)
MCV: 82.6 fL (ref 79.5–101.0)
Monocytes Absolute: 0.6 10*3/uL (ref 0.1–0.9)
Monocytes Relative: 9 %
NEUTROS PCT: 80 %
Neutro Abs: 5.7 10*3/uL (ref 1.5–6.5)
Platelets: 266 10*3/uL (ref 145–400)
RBC: 4.32 MIL/uL (ref 3.70–5.45)
RDW: 13.5 % (ref 11.2–14.5)
WBC: 7.1 10*3/uL (ref 3.9–10.3)

## 2017-09-22 LAB — TSH: TSH: 1.05 u[IU]/mL (ref 0.308–3.960)

## 2017-09-22 LAB — LACTATE DEHYDROGENASE: LDH: 150 U/L (ref 125–245)

## 2017-09-22 MED ORDER — SODIUM CHLORIDE 0.9 % IV SOLN
2.0000 mg/kg | Freq: Once | INTRAVENOUS | Status: AC
  Administered 2017-09-22: 100 mg via INTRAVENOUS
  Filled 2017-09-22: qty 4

## 2017-09-22 MED ORDER — SODIUM CHLORIDE 0.9 % IV SOLN
Freq: Once | INTRAVENOUS | Status: AC
  Administered 2017-09-22: 09:00:00 via INTRAVENOUS

## 2017-09-22 NOTE — Patient Instructions (Signed)
[facility] Discharge Instructions for Patients Receiving Chemotherapy  Today you received the following chemotherapy agents:  Keytruda.  To help prevent nausea and vomiting after your treatment, we encourage you to take your nausea medication as directed.   If you develop nausea and vomiting that is not controlled by your nausea medication, call the clinic.   BELOW ARE SYMPTOMS THAT SHOULD BE REPORTED IMMEDIATELY:  *FEVER GREATER THAN 100.5 F  *CHILLS WITH OR WITHOUT FEVER  NAUSEA AND VOMITING THAT IS NOT CONTROLLED WITH YOUR NAUSEA MEDICATION  *UNUSUAL SHORTNESS OF BREATH  *UNUSUAL BRUISING OR BLEEDING  TENDERNESS IN MOUTH AND THROAT WITH OR WITHOUT PRESENCE OF ULCERS  *URINARY PROBLEMS  *BOWEL PROBLEMS  UNUSUAL RASH Items with * indicate a potential emergency and should be followed up as soon as possible.  Feel free to call the clinic should you have any questions or concerns. The clinic phone number is [phone].  Please show the CHEMO ALERT CARD at check-in to the Emergency Department and triage nurse.    

## 2017-10-12 ENCOUNTER — Inpatient Hospital Stay: Payer: Medicare HMO

## 2017-10-12 ENCOUNTER — Inpatient Hospital Stay: Payer: Medicare HMO | Attending: Oncology

## 2017-10-12 VITALS — BP 107/52 | HR 60 | Temp 97.9°F | Resp 16

## 2017-10-12 DIAGNOSIS — C439 Malignant melanoma of skin, unspecified: Secondary | ICD-10-CM

## 2017-10-12 DIAGNOSIS — C4359 Malignant melanoma of other part of trunk: Secondary | ICD-10-CM | POA: Insufficient documentation

## 2017-10-12 DIAGNOSIS — C773 Secondary and unspecified malignant neoplasm of axilla and upper limb lymph nodes: Secondary | ICD-10-CM | POA: Insufficient documentation

## 2017-10-12 DIAGNOSIS — C438 Malignant melanoma of overlapping sites of skin: Secondary | ICD-10-CM

## 2017-10-12 DIAGNOSIS — Z5112 Encounter for antineoplastic immunotherapy: Secondary | ICD-10-CM | POA: Diagnosis present

## 2017-10-12 LAB — CBC WITH DIFFERENTIAL/PLATELET
Basophils Absolute: 0.1 10*3/uL (ref 0.0–0.1)
Basophils Relative: 1 %
EOS PCT: 0 %
Eosinophils Absolute: 0 10*3/uL (ref 0.0–0.5)
HCT: 35.8 % (ref 34.8–46.6)
HEMOGLOBIN: 11.9 g/dL (ref 11.6–15.9)
LYMPHS ABS: 0.9 10*3/uL (ref 0.9–3.3)
LYMPHS PCT: 12 %
MCH: 27.5 pg (ref 25.1–34.0)
MCHC: 33.3 g/dL (ref 31.5–36.0)
MCV: 82.7 fL (ref 79.5–101.0)
Monocytes Absolute: 0.8 10*3/uL (ref 0.1–0.9)
Monocytes Relative: 11 %
NEUTROS ABS: 6 10*3/uL (ref 1.5–6.5)
NEUTROS PCT: 76 %
Platelets: 268 10*3/uL (ref 145–400)
RBC: 4.33 MIL/uL (ref 3.70–5.45)
RDW: 13.6 % (ref 11.2–14.5)
WBC: 7.9 10*3/uL (ref 3.9–10.3)

## 2017-10-12 LAB — COMPREHENSIVE METABOLIC PANEL
ALBUMIN: 3.7 g/dL (ref 3.5–5.0)
ALT: 12 U/L (ref 0–55)
AST: 19 U/L (ref 5–34)
Alkaline Phosphatase: 56 U/L (ref 40–150)
Anion gap: 8 (ref 3–11)
BUN: 31 mg/dL — AB (ref 7–26)
CHLORIDE: 102 mmol/L (ref 98–109)
CO2: 27 mmol/L (ref 22–29)
Calcium: 10.9 mg/dL — ABNORMAL HIGH (ref 8.4–10.4)
Creatinine, Ser: 0.81 mg/dL (ref 0.60–1.10)
GFR calc Af Amer: >60 mL/min (ref >60)
Glucose, Bld: 95 mg/dL (ref 70–140)
POTASSIUM: 3.9 mmol/L (ref 3.5–5.1)
Sodium: 137 mmol/L (ref 136–145)
Total Bilirubin: 0.3 mg/dL (ref 0.2–1.2)
Total Protein: 6.8 g/dL (ref 6.4–8.3)

## 2017-10-12 MED ORDER — SODIUM CHLORIDE 0.9 % IV SOLN
Freq: Once | INTRAVENOUS | Status: AC
  Administered 2017-10-12: 16:00:00 via INTRAVENOUS

## 2017-10-12 MED ORDER — SODIUM CHLORIDE 0.9 % IV SOLN
2.0000 mg/kg | Freq: Once | INTRAVENOUS | Status: AC
  Administered 2017-10-12: 100 mg via INTRAVENOUS
  Filled 2017-10-12: qty 4

## 2017-11-02 ENCOUNTER — Ambulatory Visit: Payer: Medicare HMO

## 2017-11-02 ENCOUNTER — Telehealth: Payer: Self-pay

## 2017-11-02 ENCOUNTER — Other Ambulatory Visit: Payer: Medicare HMO

## 2017-11-02 ENCOUNTER — Ambulatory Visit: Payer: Medicare HMO | Admitting: Adult Health

## 2017-11-02 NOTE — Telephone Encounter (Signed)
Attempted to contact pt regarding need for scans to be completed prior to next infusion.  LVM on number provided for pt to return call.  Called number for her dtr, [name], but that number is "no longer in service."

## 2017-11-05 ENCOUNTER — Other Ambulatory Visit: Payer: Self-pay | Admitting: Adult Health

## 2017-11-05 DIAGNOSIS — C438 Malignant melanoma of overlapping sites of skin: Secondary | ICD-10-CM

## 2017-11-05 DIAGNOSIS — C787 Secondary malignant neoplasm of liver and intrahepatic bile duct: Secondary | ICD-10-CM

## 2017-11-08 ENCOUNTER — Ambulatory Visit (HOSPITAL_COMMUNITY)
Admission: RE | Admit: 2017-11-08 | Discharge: 2017-11-08 | Disposition: A | Discharge status: Home / Self Care | Payer: Medicare HMO | Source: Ambulatory Visit | Attending: Adult Health | Admitting: Adult Health

## 2017-11-08 ENCOUNTER — Other Ambulatory Visit: Payer: Self-pay | Admitting: Adult Health

## 2017-11-08 DIAGNOSIS — I700 Atherosclerosis of aorta: Secondary | ICD-10-CM | POA: Diagnosis not present

## 2017-11-08 DIAGNOSIS — M899 Disorder of bone, unspecified: Secondary | ICD-10-CM | POA: Insufficient documentation

## 2017-11-08 DIAGNOSIS — C787 Secondary malignant neoplasm of liver and intrahepatic bile duct: Secondary | ICD-10-CM

## 2017-11-08 DIAGNOSIS — C50212 Malignant neoplasm of upper-inner quadrant of left female breast: Secondary | ICD-10-CM

## 2017-11-08 DIAGNOSIS — Z79899 Other long term (current) drug therapy: Secondary | ICD-10-CM | POA: Diagnosis not present

## 2017-11-08 DIAGNOSIS — C438 Malignant melanoma of overlapping sites of skin: Secondary | ICD-10-CM

## 2017-11-08 DIAGNOSIS — K8020 Calculus of gallbladder without cholecystitis without obstruction: Secondary | ICD-10-CM | POA: Diagnosis not present

## 2017-11-08 DIAGNOSIS — Z170 Estrogen receptor positive status [ER+]: Secondary | ICD-10-CM

## 2017-11-08 LAB — GLUCOSE, CAPILLARY: GLUCOSE-CAPILLARY: 107 mg/dL — AB (ref 65–99)

## 2017-11-08 MED ORDER — FLUDEOXYGLUCOSE F - 18 (FDG) INJECTION
5.8000 | Freq: Once | INTRAVENOUS | Status: AC | PRN
  Administered 2017-11-08: 5.8 via INTRAVENOUS

## 2017-11-10 ENCOUNTER — Telehealth: Payer: Self-pay | Admitting: Adult Health

## 2017-11-10 ENCOUNTER — Inpatient Hospital Stay (HOSPITAL_BASED_OUTPATIENT_CLINIC_OR_DEPARTMENT_OTHER): Payer: Medicare HMO | Admitting: Adult Health

## 2017-11-10 ENCOUNTER — Inpatient Hospital Stay: Payer: Medicare HMO | Attending: Oncology

## 2017-11-10 ENCOUNTER — Inpatient Hospital Stay: Payer: Medicare HMO

## 2017-11-10 ENCOUNTER — Encounter: Payer: Self-pay | Admitting: Adult Health

## 2017-11-10 VITALS — BP 119/54 | HR 70 | Temp 97.5°F | Resp 18 | Ht 64.0 in | Wt 114.4 lb

## 2017-11-10 DIAGNOSIS — C438 Malignant melanoma of overlapping sites of skin: Secondary | ICD-10-CM

## 2017-11-10 DIAGNOSIS — C787 Secondary malignant neoplasm of liver and intrahepatic bile duct: Secondary | ICD-10-CM

## 2017-11-10 DIAGNOSIS — Z5112 Encounter for antineoplastic immunotherapy: Secondary | ICD-10-CM | POA: Insufficient documentation

## 2017-11-10 DIAGNOSIS — Z170 Estrogen receptor positive status [ER+]: Secondary | ICD-10-CM

## 2017-11-10 DIAGNOSIS — C439 Malignant melanoma of skin, unspecified: Secondary | ICD-10-CM

## 2017-11-10 DIAGNOSIS — C4359 Malignant melanoma of other part of trunk: Secondary | ICD-10-CM

## 2017-11-10 DIAGNOSIS — K922 Gastrointestinal hemorrhage, unspecified: Secondary | ICD-10-CM | POA: Diagnosis not present

## 2017-11-10 DIAGNOSIS — Z79811 Long term (current) use of aromatase inhibitors: Secondary | ICD-10-CM | POA: Insufficient documentation

## 2017-11-10 DIAGNOSIS — D500 Iron deficiency anemia secondary to blood loss (chronic): Secondary | ICD-10-CM | POA: Insufficient documentation

## 2017-11-10 DIAGNOSIS — C50212 Malignant neoplasm of upper-inner quadrant of left female breast: Secondary | ICD-10-CM | POA: Diagnosis not present

## 2017-11-10 DIAGNOSIS — C773 Secondary and unspecified malignant neoplasm of axilla and upper limb lymph nodes: Secondary | ICD-10-CM | POA: Insufficient documentation

## 2017-11-10 DIAGNOSIS — D649 Anemia, unspecified: Secondary | ICD-10-CM

## 2017-11-10 DIAGNOSIS — Z79899 Other long term (current) drug therapy: Secondary | ICD-10-CM | POA: Diagnosis not present

## 2017-11-10 DIAGNOSIS — M810 Age-related osteoporosis without current pathological fracture: Secondary | ICD-10-CM | POA: Insufficient documentation

## 2017-11-10 LAB — CBC WITH DIFFERENTIAL/PLATELET
Basophils Absolute: 0 10*3/uL (ref 0.0–0.1)
Basophils Relative: 1 %
EOS ABS: 0 10*3/uL (ref 0.0–0.5)
EOS PCT: 0 %
HCT: 30.3 % — ABNORMAL LOW (ref 34.8–46.6)
HEMOGLOBIN: 10.1 g/dL — AB (ref 11.6–15.9)
LYMPHS ABS: 0.5 10*3/uL — AB (ref 0.9–3.3)
Lymphocytes Relative: 7 %
MCH: 27.5 pg (ref 25.1–34.0)
MCHC: 33.2 g/dL (ref 31.5–36.0)
MCV: 82.9 fL (ref 79.5–101.0)
MONOS PCT: 7 %
Monocytes Absolute: 0.6 10*3/uL (ref 0.1–0.9)
NEUTROS PCT: 85 %
Neutro Abs: 6.9 10*3/uL — ABNORMAL HIGH (ref 1.5–6.5)
Platelets: 272 10*3/uL (ref 145–400)
RBC: 3.66 MIL/uL — ABNORMAL LOW (ref 3.70–5.45)
RDW: 14 % (ref 11.2–14.5)
WBC: 8 10*3/uL (ref 3.9–10.3)

## 2017-11-10 LAB — COMPREHENSIVE METABOLIC PANEL
ALT: 7 U/L (ref 0–55)
AST: 17 U/L (ref 5–34)
Albumin: 3.5 g/dL (ref 3.5–5.0)
Alkaline Phosphatase: 51 U/L (ref 40–150)
Anion gap: 9 (ref 3–11)
BUN: 42 mg/dL — AB (ref 7–26)
CHLORIDE: 107 mmol/L (ref 98–109)
CO2: 24 mmol/L (ref 22–29)
CREATININE: 0.8 mg/dL (ref 0.60–1.10)
Calcium: 9.3 mg/dL (ref 8.4–10.4)
GFR calc Af Amer: >60 mL/min (ref >60)
GFR calc non Af Amer: >60 mL/min (ref >60)
Glucose, Bld: 117 mg/dL (ref 70–140)
Potassium: 4 mmol/L (ref 3.5–5.1)
SODIUM: 140 mmol/L (ref 136–145)
Total Bilirubin: 0.3 mg/dL (ref 0.2–1.2)
Total Protein: 6.2 g/dL — ABNORMAL LOW (ref 6.4–8.3)

## 2017-11-10 LAB — LACTATE DEHYDROGENASE: LDH: 134 U/L (ref 125–245)

## 2017-11-10 LAB — TSH: TSH: 0.563 u[IU]/mL (ref 0.308–3.960)

## 2017-11-10 MED ORDER — PEMBROLIZUMAB CHEMO INJECTION 100 MG/4ML
2.0000 mg/kg | Freq: Once | INTRAVENOUS | Status: AC
  Administered 2017-11-10: 100 mg via INTRAVENOUS
  Filled 2017-11-10: qty 4

## 2017-11-10 MED ORDER — SODIUM CHLORIDE 0.9 % IV SOLN
Freq: Once | INTRAVENOUS | Status: AC
  Administered 2017-11-10: 12:00:00 via INTRAVENOUS

## 2017-11-10 NOTE — Patient Instructions (Signed)
[facility] Discharge Instructions for Patients Receiving Chemotherapy  Today you received the following chemotherapy agents:  Keytruda.  To help prevent nausea and vomiting after your treatment, we encourage you to take your nausea medication as directed.   If you develop nausea and vomiting that is not controlled by your nausea medication, call the clinic.   BELOW ARE SYMPTOMS THAT SHOULD BE REPORTED IMMEDIATELY:  *FEVER GREATER THAN 100.5 F  *CHILLS WITH OR WITHOUT FEVER  NAUSEA AND VOMITING THAT IS NOT CONTROLLED WITH YOUR NAUSEA MEDICATION  *UNUSUAL SHORTNESS OF BREATH  *UNUSUAL BRUISING OR BLEEDING  TENDERNESS IN MOUTH AND THROAT WITH OR WITHOUT PRESENCE OF ULCERS  *URINARY PROBLEMS  *BOWEL PROBLEMS  UNUSUAL RASH Items with * indicate a potential emergency and should be followed up as soon as possible.  Feel free to call the clinic should you have any questions or concerns. The clinic phone number is [phone].  Please show the CHEMO ALERT CARD at check-in to the Emergency Department and triage nurse.    

## 2017-11-10 NOTE — Telephone Encounter (Signed)
Gave patient avs and calendar of upcoming July appointments.  °

## 2017-11-10 NOTE — Progress Notes (Addendum)
[place]  [phone] [phone]     ID: Danielle [name] DOB: [birth year]  MR#: [id number]  INO#:[id number]  Patient Care Team: Bartholome Bill, MD as PCP - General (Family Medicine) Armandina Gemma, MD as Consulting Physician (General Surgery) Magrinat, Virgie Dad, MD as Consulting Physician (Oncology) Allyn Kenner, MD (Dermatology) Gaynelle Arabian, MD as Consulting Physician (Orthopedic Surgery) PCP: Bartholome Bill, MD GYN: OTHER MD:  CHIEF COMPLAINT: Estrogen receptor positive breast cancer; stage IV malignant melanoma  CURRENT TREATMENT: Anastrozole, pembrolizumab   BREAST CANCER HISTORY: From the original intake note:  Danielle [name] she had an itchy spot in the middle of her back which she scratched sometimes, but this was not evaluated until [facility] fell off a chair while changing a light bulb and broke her left upper extremity. Because she couldn't change while in a sling, her daughter Danielle [name] had 2 give her a hand and in the process noted a black lesion in the middle of Danielle [name]'s back. She took married to Dr. [name] office where on 04/17/2015 he performed a shave biopsy of a pigmented lesion over the spine in the upper back. The pathology report ([id number]) showed a breast low depth of at least 6.5 mm, with the anatomic level at least 4. There were 23 mitoses per square millimeter and extensive ulceration. There was no satellitosis. There was no evidence of regression. They host response was not brisk. This was read as a pathology stage T4b and the patient was referred to [name] for wide excision.  However on exam Dr. jerking noted bilateral axillary adenopathy and also a mass in the upper inner quadrant of the left breast. He set Danielle [name] up for bilateral mammography with tomography 06/06/2015, and this showed an irregular hyperdense mass in the left breast upper inner quadrant which was palpable. There was also a firm palpable mass in the left axilla as well as  in the contralateral, right axilla. Ultrasonography showed the left breast mass to measure 2.7 cm and at least 2 abnormally enlarged lymph nodes in the left axilla the larger one measuring 3.9 cm. In the right axilla there were also 2 abnormal appearing lymph nodes, the larger measuring 2.6 cm.  On 06/06/2015 Danielle [name] underwent biopsy of the left breast mass and both axillary adenopathy areas. The left breast mass proved to be an invasive ductal carcinoma , grade 2, estrogen and progesterone receptor positive, both at 100%, with strong staining intensity. HER-2 was not amplified, with a signals ratio of 1.42, the average copy number per cell being 2.55. The proliferation marker was 5%.  More importantly, both axillary lymph nodes showed metastatic melanoma. We were contacted and prior to today's visit we obtained a PET scan, which shows in addition to the left breast mass and the bilateral axillary lymph nodes (described as just deep to the latissimus dorsi muscles bilaterally) 2 hypermetabolic foci in the left lobe of the liver, measuring 1.3 and 0.8 cm, with an SUV max of 6.3. A 1.5 cm mass next to the left [name], clearly separate from the adrenal gland, is also hypermetabolic with an SUV max of 8.2. There were no lung or bone lesions.  Meta subsequent history is as detailed below   INTERVAL HISTORY: Danielle [name] returns today for follow-up and treatment of her malignant melanoma accompanied by her daughter. We also follow her for her breast cancer. She continues on pembrolizumab for the melanoma.   As for her breast cancer, she continues on anastrozole and tolerates this well.  Danielle [name] did just have a PET scan on 11/08/2017.  She says that she doesn't want to have PET scans any longer.  She is here with her daughter to review those results.     REVIEW OF SYSTEMS: Danielle [name] is doing well today.  She continues on Anastrozole and Pembrolizumab without difficulty.  She has some joint aching in her left knee, but  otherwise is doing well.  She doesn't have fever,c hills, headaches, vision change.  She denies chest pain, palpitations, shortness of breath, cough.  She isn't experiencing nausea, vomiting, diarrhea, constipation.  A detailed ROS was non contributory today.    PAST MEDICAL HISTORY: No past medical history on file. Remote history of rectal prolapse with chronic constipation resulting, history of left humeral fracture, osteopenia  PAST SURGICAL HISTORY: Past Surgical History:  Procedure Laterality Date  . HIP SURGERY      FAMILY HISTORY No family history on file.  The patient's father died at the age of 55 in the setting of a call abuse. The patient's mother died from "old age" at 49. The patient had 4 brothers, no sisters. One brother died in his 33s from brain cancer. Another died from a stroke, and other from emphysema. The 26, youngest brother is 53 and jogs every day. There is no history of breast or ovarian cancer in the family. There is also no prior history of melanoma.  GYNECOLOGIC HISTORY:  No LMP recorded. Patient is postmenopausal. Menarche age 58, first live birth age 44, the patient is [place] P4. She stopped having periods in her early 46s. She did not use hormone replacement. She never took oral contraceptives.  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 She is widowed. She is now retired, and lives alone, with no pets. Her son, [name] lives in [place], and is a retired Optometrist. Son, Danielle [name] lives in [place], [place] and is a Chief of Staff. Daughter, Danielle [name] is a Marine scientist at [employer] locally. The fourth child, [name], died in an automobile accident at age 64. The patient has 4 grandchildren. She has 1 great-grandchild, named Danielle [name], from her daughters, child. She is a Corporate treasurer. She attends services at [employer].   ADVANCED DIRECTIVES: Not yet notarized; Danielle [name]  intends to name her daughter Danielle [name] as her healthcare power of attorney. She can be reached at [phone]   HEALTH MAINTENANCE: Social History   Tobacco Use  . Smoking status: Never Smoker  Substance Use Topics  . Alcohol use: No  . Drug use: No     Colonoscopy: Remote  PAP:  Bone density: Remote  Lipid panel:  Allergies  Allergen Reactions  . Vicodin [Hydrocodone-Acetaminophen] Nausea Only    Current Outpatient Medications  Medication Sig Dispense Refill  . anastrozole (ARIMIDEX) 1 MG tablet TAKE ONE TABLET (1 MG) BY MOUTH [name]. 90 tablet 2  . aspirin EC 81 MG tablet Take by mouth.    . cholecalciferol (VITAMIN D) 1000 units tablet Take 1 tablet (1,000 Units total) by mouth [name]. 100 tablet 4  . enalapril (VASOTEC) 10 MG tablet Take 1 tablet (10 mg total) by mouth [name]. 90 tablet 4  . levothyroxine (SYNTHROID, LEVOTHROID) 100 MCG tablet TAKE ONE TABLET BY MOUTH [name] BEFORE BREAKFAST 30 tablet 10  . Lutein 20 MG CAPS Take by mouth.    . Red Yeast Rice Extract (RED YEAST RICE PO) Take by mouth.    . triamterene-hydrochlorothiazide (  DYAZIDE) 37.5-25 MG capsule Take 1 capsule by mouth [name].     . verapamil (CALAN-SR) 240 MG CR tablet   0   No current facility-administered medications for this visit.    Facility-Administered Medications Ordered in Other Visits  Medication Dose Route Frequency Provider Last Rate Last Dose  . pembrolizumab (KEYTRUDA) 100 mg in sodium chloride 0.9 % 50 mL chemo infusion  2 mg/kg (Treatment Plan Recorded) Intravenous Once Magrinat, Virgie Dad, MD 108 mL/hr at 11/10/17 1247 100 mg at 11/10/17 1247    OBJECTIVE:   Vitals:   11/10/17 1101  BP: (!) 119/54  Pulse: 70  Resp: 18  Temp: (!) 97.5 F (36.4 C)  SpO2: 100%     Body mass index is 19.64 kg/m.    ECOG FS:1 - Symptomatic but completely ambulatory GENERAL: Patient is a well appearing older female in no acute distress HEENT:  Sclerae anicteric.  Oropharynx clear and moist. No  ulcerations or evidence of oropharyngeal candidiasis. Neck is supple.  NODES:  No cervical, supraclavicular lymphadenopathy, 1cm right axillary lymphadnopathy palpated.  BREAST EXAM:  Deferred. LUNGS:  Clear to auscultation bilaterally.  No wheezes or rhonchi. HEART:  Regular rate and rhythm. No murmur appreciated. ABDOMEN:  Soft, nontender.  Positive, normoactive bowel sounds. No organomegaly palpated. MSK:  No focal spinal tenderness to palpation. Full range of motion bilaterally in the upper extremities. EXTREMITIES:  No peripheral edema.   SKIN:  Clear with no obvious rashes or skin changes. No nail dyscrasia. NEURO:  Nonfocal. Well oriented.  Appropriate affect.   LAB RESULTS:  CMP     Component Value Date/Time   NA 140 11/10/2017 1035   NA 139 05/18/2017 1325   K 4.0 11/10/2017 1035   K 3.9 05/18/2017 1325   CL 107 11/10/2017 1035   CO2 24 11/10/2017 1035   CO2 23 05/18/2017 1325   GLUCOSE 117 11/10/2017 1035   GLUCOSE 90 05/18/2017 1325   BUN 42 (H) 11/10/2017 1035   BUN 24.8 05/18/2017 1325   CREATININE 0.80 11/10/2017 1035   CREATININE 0.8 05/18/2017 1325   CALCIUM 9.3 11/10/2017 1035   CALCIUM 9.7 05/18/2017 1325   PROT 6.2 (L) 11/10/2017 1035   PROT 7.0 05/18/2017 1325   ALBUMIN 3.5 11/10/2017 1035   ALBUMIN 3.8 05/18/2017 1325   AST 17 11/10/2017 1035   AST 22 05/18/2017 1325   ALT 7 11/10/2017 1035   ALT 11 05/18/2017 1325   ALKPHOS 51 11/10/2017 1035   ALKPHOS 55 05/18/2017 1325   BILITOT 0.3 11/10/2017 1035   BILITOT 0.40 05/18/2017 1325   GFRNONAA >60 11/10/2017 1035   GFRAA >60 11/10/2017 1035    INo results found for: SPEP, UPEP  Lab Results  Component Value Date   WBC 8.0 11/10/2017   NEUTROABS 6.9 (H) 11/10/2017   HGB 10.1 (L) 11/10/2017   HCT 30.3 (L) 11/10/2017   MCV 82.9 11/10/2017   PLT 272 11/10/2017      Chemistry      Component Value Date/Time   NA 140 11/10/2017 1035   NA 139 05/18/2017 1325   K 4.0 11/10/2017 1035   K 3.9  05/18/2017 1325   CL 107 11/10/2017 1035   CO2 24 11/10/2017 1035   CO2 23 05/18/2017 1325   BUN 42 (H) 11/10/2017 1035   BUN 24.8 05/18/2017 1325   CREATININE 0.80 11/10/2017 1035   CREATININE 0.8 05/18/2017 1325      Component Value Date/Time   CALCIUM 9.3 11/10/2017 1035  CALCIUM 9.7 05/18/2017 1325   ALKPHOS 51 11/10/2017 1035   ALKPHOS 55 05/18/2017 1325   AST 17 11/10/2017 1035   AST 22 05/18/2017 1325   ALT 7 11/10/2017 1035   ALT 11 05/18/2017 1325   BILITOT 0.3 11/10/2017 1035   BILITOT 0.40 05/18/2017 1325       No results found for: LABCA2  No components found for: LABCA125  No results for input(s): INR in the last 168 hours.  Urinalysis No results found for: COLORURINE, APPEARANCEUR, LABSPEC, PHURINE, GLUCOSEU, HGBUR, BILIRUBINUR, KETONESUR, PROTEINUR, UROBILINOGEN, NITRITE, LEUKOCYTESUR  STUDIES: She completed a PET scan on 04/26/2017 showing: Generally mild to moderate improvement in the prior osseous and nodal metastatic lesions. Branching nodular density in the right lower lobe is stable in size, maximum SUV 1.4 (formerly 1.9). Other imaging findings of potential clinical significance: Dense mitral calcification. Aortic Atherosclerosis (ICD10-I70.0). Coronary atherosclerosis. Diffuse colonic diverticulosis. Cholelithiasis. Renal cysts including a complex left renal cyst.  ASSESSMENT: 84 y.o. [place] woman with concurrent breast cancer and melanoma  BREAST CANCER: (1) left breast upper inner quadrant biopsy 06/06/2015 shows a clinical T2 NX, stage II invasive ductal carcinoma , grade 2, estrogen receptor and progesterone receptor strongly positive, with an MIB-1 of 5%, and no HER-2 amplification, the signals ratio being 1.42 and the number per cell 2.55.   (2) anastrozole started 07/01/2015  (a) bone density February 2017 shows osteoporosis with a T score of -2.5  (b) mammography and ultrasonography 06/23/2016 shows continuing response  MALIGNANT  MELANOMA: (a) shave biopsy from the mid upper back 04/17/2015 shows invasive malignant melanoma, Breslow depth 6.5 mm plus, Clark's level 4+, with 23 mitoses per square millimeter, extensive ulceration,, with involved edges, and non-brisk host response  (i) biopsy of right and left axillary lymph nodes 06/06/2015 showed metastatic melanoma, BRAF wild type  (ii) PET scan 07/01/2015 shows bilateral axillary adenopathy, a left latissimus mass, a left pararenal mass, and 2 liver lesions  (iii) PET scan 12/06/2017shows continuing improvement in hypermetabolic lesions   (iv) PET scan 11/18/2016 shows continuing slight improvement. The left humeral fracture (initially documented 02/22/2015) shows continuing activity but a decreased soft tissue component.  (v) PET scan 04/26/2017 shows continuing improvement of the measurable nodular and bony disease  (b) pembrolizumab started 07/16/2015, repeated every 21 days  (i) increased TSH noted June 2018, corrected on Synthroid   PLAN:  Neve is doing well today.  She is tolerating treatment well and will continue with Pembrolizumab and Anastrozole.  Dr. Jana Hakim also met with Danielle [name] and her daughter and reviewed her PET scan scan.  They appear stable.  We will follow her right axillary lymph node clinically as she no longer wants to undergo PET scans.  She does have a new onset anemia.  We will get some iron studies, and send her with stool cards.  She will return in early July for her next appointment, and will see [place] back on 12/28/2017 for follow up and treatment as well.    She knows to call for any problems that may develop before that visit.   Wilber Bihari, NP  11/10/17 1:09 PM Medical Oncology and Hematology [facility] [address] [place], [place] [address] Tel. [phone]    Fax. [phone]   ADDENDUM: Hildreth has pretty much had it with studies. Luckily she is willing to continue treatment, which I beieve is benefitting her.  She is now 2-1/2 years out from definitive surgery for her breast cancer, and there is  no evidence of disease progression.  She tolerates anastrozole well.  Her last mammogram was a year and a half ago.  We can continue to inspect and palpate the left breast and left axilla for evidence of progression  She has a palpable right axillary lymph node measuring approximately 1-1-1/2 cm on today's exam.  (The left axilla is benign) we will follow that and of course also we will also watch for systemic symptoms regarding the melanoma.  She is willing to continue the pembrolizumab which she tolerates well.  At this point I am delighted that Ziasia is doing as well as she is doing.  I do not think she is having any symptoms or problems related to either of her cancers or their treatment.  I personally saw this patient and performed a substantive portion of this encounter with the listed APP documented above.   Chauncey Cruel, MD Medical Oncology and Hematology [facility] [address] [place], [place] [address] Tel. [phone]    Fax. [phone]

## 2017-11-11 ENCOUNTER — Other Ambulatory Visit: Payer: Self-pay | Admitting: *Deleted

## 2017-11-11 DIAGNOSIS — C50212 Malignant neoplasm of upper-inner quadrant of left female breast: Secondary | ICD-10-CM

## 2017-11-11 DIAGNOSIS — Z5112 Encounter for antineoplastic immunotherapy: Secondary | ICD-10-CM | POA: Diagnosis not present

## 2017-11-11 DIAGNOSIS — Z170 Estrogen receptor positive status [ER+]: Principal | ICD-10-CM

## 2017-11-11 DIAGNOSIS — D649 Anemia, unspecified: Secondary | ICD-10-CM

## 2017-11-11 LAB — OCCULT BLOOD X 1 CARD TO LAB, STOOL
FECAL OCCULT BLD: POSITIVE — AB
Fecal Occult Bld: POSITIVE — AB

## 2017-11-12 ENCOUNTER — Other Ambulatory Visit: Payer: Self-pay | Admitting: Adult Health

## 2017-11-12 ENCOUNTER — Telehealth: Payer: Self-pay

## 2017-11-12 DIAGNOSIS — K921 Melena: Secondary | ICD-10-CM

## 2017-11-12 MED ORDER — PANTOPRAZOLE SODIUM 40 MG PO TBEC: 40.0000 mg | DELAYED_RELEASE_TABLET | Freq: Two times a day (BID) | ORAL | 2 refills | Status: AC

## 2017-11-12 NOTE — Telephone Encounter (Signed)
-----   Message from Gardenia Phlegm, NP sent at 11/12/2017  7:50 AM EDT ----- Please let patient know that her stool is positive for blood.  I would recommend that she stop aspirin.  Take Protonix 40mg  BID (will have to call into her pharmacy, disp 60, 2 refills), and consider urgent appointment with GI doctor.   Lets get a CBC next week to monitor.    Thanks,  Mendel Ryder ----- Message ----- From: Buel Ream, Lab In [place] Sent: 11/11/2017   4:20 PM To: Gardenia Phlegm, NP

## 2017-11-12 NOTE — Telephone Encounter (Signed)
LOS sent to scheduling 11/12/17

## 2017-11-12 NOTE — Telephone Encounter (Signed)
Spoke with [name] (patient's daughter).  Informed of  Stool positive for blood.  Gave NP recommendations to stop aspirin ans start Protonix.  NP suggested to see GI ASAP.  [name] agreed to the medications however wants to hold off on the GI visit for now.  [name] states that her mother has not been acting right since her son passed and she thinks that her mother is giving up.  [name] believes that pt has been double dosing on metamucil because she has been obsessed with her bowels lately.  Appetite has decreased and pt has not been moving around as much lately.  Will call in Protonix 40 mg BID, disp 60 , x 2 refills for pt.  [name] will pick up from pharmacy per her request.

## 2017-11-12 NOTE — Telephone Encounter (Signed)
Can we please schedule CBC next week?  Thanks,  Mendel Ryder

## 2017-11-15 ENCOUNTER — Other Ambulatory Visit: Payer: Self-pay

## 2017-11-15 ENCOUNTER — Inpatient Hospital Stay: Payer: Medicare HMO

## 2017-11-15 ENCOUNTER — Inpatient Hospital Stay (HOSPITAL_COMMUNITY)
Admission: AD | Admit: 2017-11-15 | Discharge: 2017-11-16 | DRG: 378 | Disposition: A | Discharge status: Home Health | Payer: Medicare Other | Source: Ambulatory Visit | Attending: Family Medicine | Admitting: Family Medicine

## 2017-11-15 ENCOUNTER — Telehealth: Payer: Self-pay

## 2017-11-15 ENCOUNTER — Inpatient Hospital Stay (HOSPITAL_BASED_OUTPATIENT_CLINIC_OR_DEPARTMENT_OTHER): Payer: Medicare HMO | Admitting: Medical

## 2017-11-15 ENCOUNTER — Encounter (HOSPITAL_COMMUNITY): Payer: Self-pay

## 2017-11-15 VITALS — BP 117/62 | HR 121 | Temp 98.5°F | Resp 18 | Ht 64.0 in

## 2017-11-15 DIAGNOSIS — Z79811 Long term (current) use of aromatase inhibitors: Secondary | ICD-10-CM

## 2017-11-15 DIAGNOSIS — K274 Chronic or unspecified peptic ulcer, site unspecified, with hemorrhage: Secondary | ICD-10-CM

## 2017-11-15 DIAGNOSIS — Z9181 History of falling: Secondary | ICD-10-CM | POA: Diagnosis not present

## 2017-11-15 DIAGNOSIS — C773 Secondary and unspecified malignant neoplasm of axilla and upper limb lymph nodes: Secondary | ICD-10-CM | POA: Diagnosis present

## 2017-11-15 DIAGNOSIS — Z79899 Other long term (current) drug therapy: Secondary | ICD-10-CM

## 2017-11-15 DIAGNOSIS — Z7982 Long term (current) use of aspirin: Secondary | ICD-10-CM | POA: Diagnosis not present

## 2017-11-15 DIAGNOSIS — Z79890 Hormone replacement therapy (postmenopausal): Secondary | ICD-10-CM | POA: Diagnosis not present

## 2017-11-15 DIAGNOSIS — D62 Acute posthemorrhagic anemia: Secondary | ICD-10-CM | POA: Diagnosis present

## 2017-11-15 DIAGNOSIS — K922 Gastrointestinal hemorrhage, unspecified: Secondary | ICD-10-CM

## 2017-11-15 DIAGNOSIS — Z170 Estrogen receptor positive status [ER+]: Secondary | ICD-10-CM

## 2017-11-15 DIAGNOSIS — E039 Hypothyroidism, unspecified: Secondary | ICD-10-CM | POA: Diagnosis present

## 2017-11-15 DIAGNOSIS — K921 Melena: Secondary | ICD-10-CM

## 2017-11-15 DIAGNOSIS — R296 Repeated falls: Secondary | ICD-10-CM | POA: Diagnosis not present

## 2017-11-15 DIAGNOSIS — Y92009 Unspecified place in unspecified non-institutional (private) residence as the place of occurrence of the external cause: Secondary | ICD-10-CM

## 2017-11-15 DIAGNOSIS — R0781 Pleurodynia: Secondary | ICD-10-CM | POA: Diagnosis present

## 2017-11-15 DIAGNOSIS — Z9841 Cataract extraction status, right eye: Secondary | ICD-10-CM | POA: Diagnosis not present

## 2017-11-15 DIAGNOSIS — D500 Iron deficiency anemia secondary to blood loss (chronic): Secondary | ICD-10-CM

## 2017-11-15 DIAGNOSIS — W19XXXA Unspecified fall, initial encounter: Secondary | ICD-10-CM | POA: Diagnosis not present

## 2017-11-15 DIAGNOSIS — Z5329 Procedure and treatment not carried out because of patient's decision for other reasons: Secondary | ICD-10-CM | POA: Diagnosis not present

## 2017-11-15 DIAGNOSIS — M810 Age-related osteoporosis without current pathological fracture: Secondary | ICD-10-CM

## 2017-11-15 DIAGNOSIS — C50212 Malignant neoplasm of upper-inner quadrant of left female breast: Secondary | ICD-10-CM | POA: Diagnosis not present

## 2017-11-15 DIAGNOSIS — Z66 Do not resuscitate: Secondary | ICD-10-CM | POA: Diagnosis present

## 2017-11-15 DIAGNOSIS — C439 Malignant melanoma of skin, unspecified: Secondary | ICD-10-CM | POA: Diagnosis not present

## 2017-11-15 DIAGNOSIS — C4359 Malignant melanoma of other part of trunk: Secondary | ICD-10-CM | POA: Diagnosis present

## 2017-11-15 DIAGNOSIS — Z9842 Cataract extraction status, left eye: Secondary | ICD-10-CM

## 2017-11-15 DIAGNOSIS — I10 Essential (primary) hypertension: Secondary | ICD-10-CM | POA: Diagnosis present

## 2017-11-15 DIAGNOSIS — K219 Gastro-esophageal reflux disease without esophagitis: Secondary | ICD-10-CM | POA: Diagnosis present

## 2017-11-15 DIAGNOSIS — C50919 Malignant neoplasm of unspecified site of unspecified female breast: Secondary | ICD-10-CM

## 2017-11-15 DIAGNOSIS — Z96642 Presence of left artificial hip joint: Secondary | ICD-10-CM | POA: Diagnosis present

## 2017-11-15 DIAGNOSIS — Z885 Allergy status to narcotic agent status: Secondary | ICD-10-CM

## 2017-11-15 DIAGNOSIS — C7951 Secondary malignant neoplasm of bone: Secondary | ICD-10-CM | POA: Diagnosis present

## 2017-11-15 DIAGNOSIS — K254 Chronic or unspecified gastric ulcer with hemorrhage: Secondary | ICD-10-CM

## 2017-11-15 HISTORY — DX: Malignant neoplasm of unspecified site of unspecified female breast: C50.919

## 2017-11-15 HISTORY — DX: Malignant melanoma of skin, unspecified: C43.9

## 2017-11-15 HISTORY — DX: Gastro-esophageal reflux disease without esophagitis: K21.9

## 2017-11-15 HISTORY — DX: Essential (primary) hypertension: I10

## 2017-11-15 HISTORY — DX: Hypothyroidism, unspecified: E03.9

## 2017-11-15 LAB — SAMPLE TO BLOOD BANK

## 2017-11-15 LAB — CMP (CANCER CENTER ONLY)
ALK PHOS: 53 U/L (ref 40–150)
ALT: 11 U/L (ref 0–55)
AST: 19 U/L (ref 5–34)
Albumin: 3.2 g/dL — ABNORMAL LOW (ref 3.5–5.0)
Anion gap: 10 (ref 3–11)
BUN: 47 mg/dL — AB (ref 7–26)
CALCIUM: 9.4 mg/dL (ref 8.4–10.4)
CHLORIDE: 106 mmol/L (ref 98–109)
CO2: 23 mmol/L (ref 22–29)
Creatinine: 0.93 mg/dL (ref 0.60–1.10)
GFR, Estimated: 53 mL/min — ABNORMAL LOW (ref >60)
GLUCOSE: 150 mg/dL — AB (ref 70–140)
Potassium: 3.5 mmol/L (ref 3.5–5.1)
SODIUM: 139 mmol/L (ref 136–145)
Total Bilirubin: 0.2 mg/dL (ref 0.2–1.2)
Total Protein: 6 g/dL — ABNORMAL LOW (ref 6.4–8.3)

## 2017-11-15 LAB — CBC WITH DIFFERENTIAL (CANCER CENTER ONLY)
BASOS ABS: 0.1 10*3/uL (ref 0.0–0.1)
Basophils Relative: 1 %
EOS ABS: 0 10*3/uL (ref 0.0–0.5)
Eosinophils Relative: 0 %
HCT: 23.2 % — ABNORMAL LOW (ref 34.8–46.6)
HEMOGLOBIN: 7.5 g/dL — AB (ref 11.6–15.9)
LYMPHS PCT: 3 %
Lymphs Abs: 0.5 10*3/uL — ABNORMAL LOW (ref 0.9–3.3)
MCH: 26.8 pg (ref 25.1–34.0)
MCHC: 32.3 g/dL (ref 31.5–36.0)
MCV: 83 fL (ref 79.5–101.0)
Monocytes Absolute: 1.2 10*3/uL — ABNORMAL HIGH (ref 0.1–0.9)
Monocytes Relative: 9 %
NEUTROS PCT: 87 %
Neutro Abs: 11.9 10*3/uL — ABNORMAL HIGH (ref 1.5–6.5)
PLATELETS: 282 10*3/uL (ref 145–400)
RBC: 2.79 MIL/uL — AB (ref 3.70–5.45)
RDW: 13.9 % (ref 11.2–14.5)
WBC: 13.7 10*3/uL — AB (ref 3.9–10.3)

## 2017-11-15 LAB — IRON AND TIBC
Iron: 11 ug/dL — ABNORMAL LOW (ref 41–142)
Saturation Ratios: 4 % — ABNORMAL LOW (ref 21–57)
TIBC: 278 ug/dL (ref 236–444)
UIBC: 267 ug/dL

## 2017-11-15 LAB — FERRITIN: FERRITIN: 30 ng/mL (ref 9–269)

## 2017-11-15 MED ORDER — ACETAMINOPHEN 325 MG PO TABS
650.0000 mg | ORAL_TABLET | Freq: Four times a day (QID) | ORAL | Status: DC | PRN
  Administered 2017-11-15: 650 mg via ORAL
  Filled 2017-11-15: qty 2

## 2017-11-15 MED ORDER — LEVOTHYROXINE SODIUM 100 MCG PO TABS
100.0000 ug | ORAL_TABLET | Freq: Every day | ORAL | Status: DC
  Administered 2017-11-16: 100 ug via ORAL
  Filled 2017-11-15: qty 1

## 2017-11-15 MED ORDER — ONDANSETRON HCL 4 MG PO TABS: 4.0000 mg | ORAL_TABLET | ORAL | Status: DC | PRN

## 2017-11-15 MED ORDER — CALCIUM CARBONATE ANTACID 500 MG PO CHEW
1.0000 | CHEWABLE_TABLET | Freq: Three times a day (TID) | ORAL | Status: DC
  Administered 2017-11-15 – 2017-11-16 (×2): 200 mg via ORAL
  Filled 2017-11-15 (×2): qty 1

## 2017-11-15 MED ORDER — ANASTROZOLE 1 MG PO TABS
1.0000 mg | ORAL_TABLET | Freq: Every day | ORAL | Status: DC
  Administered 2017-11-15 – 2017-11-16 (×2): 1 mg via ORAL
  Filled 2017-11-15 (×2): qty 1

## 2017-11-15 MED ORDER — PANTOPRAZOLE SODIUM 40 MG PO TBEC
40.0000 mg | DELAYED_RELEASE_TABLET | Freq: Two times a day (BID) | ORAL | Status: DC
  Administered 2017-11-15 – 2017-11-16 (×2): 40 mg via ORAL
  Filled 2017-11-15 (×2): qty 1

## 2017-11-15 MED ORDER — SCOPOLAMINE 1 MG/3DAYS TD PT72
1.0000 | MEDICATED_PATCH | TRANSDERMAL | Status: DC
  Administered 2017-11-15: 1.5 mg via TRANSDERMAL
  Filled 2017-11-15: qty 1

## 2017-11-15 MED ORDER — LIDOCAINE 5 % EX PTCH
1.0000 | MEDICATED_PATCH | CUTANEOUS | Status: DC
  Filled 2017-11-15 (×2): qty 1

## 2017-11-15 MED ORDER — VERAPAMIL HCL ER 240 MG PO TBCR
240.0000 mg | EXTENDED_RELEASE_TABLET | Freq: Every day | ORAL | Status: DC
  Administered 2017-11-15: 240 mg via ORAL
  Filled 2017-11-15: qty 1

## 2017-11-15 NOTE — H&P (Signed)
History and Physical    Danielle [name] ZDG:[id number] DOB: 01/08/1926 DOA: 11/15/2017  PCP: Bartholome Bill, MD Patient coming from: HOME  Chief Complaint: ANEMIA  HPI: Danielle [name] is a 84 y.o. female with medical history significant of metastatic melanoma,breast cancer admitted with gi bleed.hb 7.5 today down from 10.1, 5 days ago.continues with black stools.  Denies any hematemesis has been nauseous says nothing helps with her nausea.  Denies chest pain shortness of breath cough fever or chills.  However she does not want blood transfusion or any GI work-up at this point.  She has had multiple falls prior to admission to the hospital.  She just wants to be placed in a nursing home so she does not have to bother her daughter on a day-to-day basis.  She feels that she is ready to go when the [place] call her.  She has been a widow since the age of 76.  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    ED Course: Patient was directly admitted from cancer center.  Review of Systems: See above. Ambulatory Status: Usually ambulatory at baseline but has been having multiple falls prior to admission.  Past Medical History:  Diagnosis Date  . GERD (gastroesophageal reflux disease)   . Hypertension   . Hypothyroidism     Past Surgical History:  Procedure Laterality Date  . bilateral cataratct extraction    . EYE SURGERY    . HIP SURGERY    . JOINT REPLACEMENT    . Rectal prolapse surgery      Social History   Socioeconomic History  . Marital status: Widowed    Spouse name: Not on file  . Number of children: Not on file  . Years of education: Not on file  . Highest education level: Not on file  Occupational History  . Not on file  Social Needs  . Financial resource strain: Not on file  . Food insecurity:    Worry: Not on file    Inability: Not on file  . Transportation  needs:    Medical: Not on file    Non-medical: Not on file  Tobacco Use  . Smoking status: Never Smoker  . Smokeless tobacco: Never Used  Substance and Sexual Activity  . Alcohol use: No  . Drug use: No  . Sexual activity: Not on file  Lifestyle  . Physical activity:    Days per week: Not on file    Minutes per session: Not on file  . Stress: Not on file  Relationships  . Social connections:    Talks on phone: Not on file    Gets together: Not on file    Attends religious service: Not on file    Active member of club or organization: Not on file    Attends meetings of clubs or organizations: Not on file    Relationship status: Not on file  . Intimate partner violence:    Fear of current or ex partner: Not on file    Emotionally abused: Not on file    Physically abused: Not on file    Forced sexual activity: Not on file  Other Topics Concern  . Not on file  Social History Narrative  . Not on file    Allergies  Allergen Reactions  . Vicodin [Hydrocodone-Acetaminophen] Nausea Only  History reviewed. No pertinent family history.    Prior to Admission medications   Medication Sig Start Date End Date Taking? Authorizing Provider  anastrozole (ARIMIDEX) 1 MG tablet TAKE ONE TABLET (1 MG) BY MOUTH DAILY. 08/16/17  Yes Magrinat, Virgie Dad, MD  aspirin EC 81 MG tablet Take 81 mg by mouth.  11/16/13  Yes [provider]  cholecalciferol (VITAMIN D) 1000 units tablet Take 1 tablet (1,000 Units total) by mouth daily. 11/25/16  Yes Magrinat, Virgie Dad, MD  enalapril (VASOTEC) 10 MG tablet Take 1 tablet (10 mg total) by mouth daily. 08/12/16  Yes Magrinat, Virgie Dad, MD  levothyroxine (SYNTHROID, LEVOTHROID) 100 MCG tablet TAKE ONE TABLET BY MOUTH DAILY BEFORE BREAKFAST 07/05/17  Yes Magrinat, Virgie Dad, MD  Lutein 20 MG CAPS Take by mouth.   Yes [provider]  pantoprazole (PROTONIX) 40 MG tablet Take 1 tablet (40 mg total) by mouth 2 (two) times daily. 11/12/17  Yes  Causey, Charlestine Massed, NP  Red Yeast Rice Extract (RED YEAST RICE PO) Take by mouth.   Yes [provider]  verapamil (CALAN-SR) 240 MG CR tablet Take 240 mg by mouth at bedtime.  05/09/15  Yes [provider]    Physical Exam: Very pleasant young lady in no acute distress Vitals:   11/15/17 1410  BP: (!) 154/69  Pulse: 84  Resp: 18  Temp: 97.9 F (36.6 C)  TempSrc: Oral  SpO2: 100%  Weight: 50.6 kg (111 lb 9.6 oz)  Height: 5\' 4"  (1.626 m)     General:  Appears calm and comfortable Eyes:  PERRL, EOMI, normal lids, iris ENT:  grossly normal hearing, lips & tongue, mmm Neck: no LAD, masses or thyromegaly Cardiovascular:  RRR, no m/r/g. No LE edema.  Respiratory:  CTA bilaterally, no w/r/r. Normal respiratory effort. Abdomen: soft, ntnd, NABS.EPIGASTRIC TENDERNESS. Skin:  no rash or induration seen on limited exam Musculoskeletal: grossly normal tone BUE/BLE, good ROM, no bony abnormality Psychiatric:  grossly normal mood and affect, speech fluent and appropriate, AOx3 Neurologic:  CN 2-12 grossly intact, moves all extremities in coordinated fashion, sensation intact  Labs on Admission: I have personally reviewed following labs and imaging studies  CBC: Recent Labs  Lab 11/10/17 1035 11/15/17 1111  WBC 8.0 13.7*  NEUTROABS 6.9* 11.9*  HGB 10.1* 7.5*  HCT 30.3* 23.2*  MCV 82.9 83.0  PLT 272 176   Basic Metabolic Panel: Recent Labs  Lab 11/10/17 1035 11/15/17 1111  NA 140 139  K 4.0 3.5  CL 107 106  CO2 24 23  GLUCOSE 117 150*  BUN 42* 47*  CREATININE 0.80 0.93  CALCIUM 9.3 9.4   GFR: Estimated Creatinine Clearance: 32.1 mL/min (by C-G formula based on SCr of 0.93 mg/dL). Liver Function Tests: Recent Labs  Lab 11/10/17 1035 11/15/17 1111  AST 17 19  ALT 7 11  ALKPHOS 51 53  BILITOT 0.3 0.2  PROT 6.2* 6.0*  ALBUMIN 3.5 3.2*   No results for input(s): LIPASE, AMYLASE in the last 168 hours. No results for input(s): AMMONIA in the  last 168 hours. Coagulation Profile: No results for input(s): INR, PROTIME in the last 168 hours. Cardiac Enzymes: No results for input(s): CKTOTAL, CKMB, CKMBINDEX, TROPONINI in the last 168 hours. BNP (last 3 results) No results for input(s): PROBNP in the last 8760 hours. HbA1C: No results for input(s): HGBA1C in the last 72 hours. CBG: No results for input(s): GLUCAP in the last 168 hours. Lipid Profile: No results  for input(s): CHOL, HDL, LDLCALC, TRIG, CHOLHDL, LDLDIRECT in the last 72 hours. Thyroid Function Tests: No results for input(s): TSH, T4TOTAL, FREET4, T3FREE, THYROIDAB in the last 72 hours. Anemia Panel: Recent Labs    11/15/17 1112  FERRITIN 30  TIBC 278  IRON 11*   Urine analysis: No results found for: COLORURINE, APPEARANCEUR, LABSPEC, PHURINE, GLUCOSEU, HGBUR, BILIRUBINUR, KETONESUR, PROTEINUR, UROBILINOGEN, NITRITE, LEUKOCYTESUR  Creatinine Clearance: Estimated Creatinine Clearance: 32.1 mL/min (by C-G formula based on SCr of 0.93 mg/dL).  Sepsis Labs: @LABRCNTIP (procalcitonin:4,lacticidven:4) )No results found for this or any previous visit (from the past 240 hour(s)).   Radiological Exams on Admission: No results found.  Assessment/Plan Active Problems:   GI bleed   1] anemia with dark stools possible upper GI bleed.  Patient's daughter reports that she does have a history of ulcer IV long time ago.  And she has been taking some calcium and some Seals  opened up in the water which probably have caused her gastritis .  However patient does not want any GI work-up or blood transfusion.  She would just want to continue the Protonix twice a day.  She is awake alert oriented.  Hold aspirin.  2] history of metastatic melanoma  3] history of breast cancer continue Arimidex per patient request.  4] history of hypertension continue Cardizem.  Hold Vasotec.  5] hypothyroidism continue Synthroid.  6] recurrent nausea I will order Zofran and scopolamine  patch.  7] left rib cage pain status post fall apply lidocaine patch.     DVT prophylaxis: None per patient request  code Status: DO NOT RESUSCITATE  Family Communication: Discussed in detail with patient and her daughter who was in the room. Disposition Plan: Will obtain PT OT and social service consult for placement.  Consults called: None Admission status: Inpatient   Georgette Shell MD [facility]  If 7PM-7AM, please contact night-coverage [email or web address] Password [facility]  11/15/2017, 4:29 PM

## 2017-11-15 NOTE — Progress Notes (Signed)
COURTESY NOTE: 84 y/o [place] woman I follow for breast cancer and metastatic melanoma, both well-controlled, admitted with GIB.-- She is refusing blood draws or transfusions. She has a DNR in place.  She is very lucid and has been thinking about withdrawing care for some time. Family is in the room and they acquiesce to her wishes.  Irma is Catholic and very religious. If it were possible to place her in [place], that would be a great comfort to her and her family.  I am adding tylenol and tums to her orders at her request.  I am adding a summary or her cancer care below.  Please let me know if I can be of further help.     [place] woman with concurrent breast cancer and melanoma  BREAST CANCER: (1) left breast upper inner quadrant biopsy 06/06/2015 shows a clinical T2 NX, stage II invasive ductal carcinoma , grade 2, estrogen receptor and progesterone receptor strongly positive, with an MIB-1 of 5%, and no HER-2 amplification, the signals ratio being 1.42 and the number per cell 2.55.   (2) anastrozole started 07/01/2015             (a) bone density February 2017 shows osteoporosis with a T score of -2.5             (b) mammography and ultrasonography 06/23/2016 shows continuing response  MALIGNANT MELANOMA: (a) shave biopsy from the mid upper back 04/17/2015 shows invasive malignant melanoma, Breslow depth 6.5 mm plus, Clark's level 4+, with 23 mitoses per square millimeter, extensive ulceration,, with involved edges, and non-brisk host response             (i) biopsy of right and left axillary lymph nodes 06/06/2015 showed metastatic melanoma, BRAF wild type             (ii) PET scan 07/01/2015 shows bilateral axillary adenopathy, a left latissimus mass, a left pararenal mass, and 2 liver lesions             (iii) PET scan 12/06/2017shows continuing improvement in hypermetabolic lesions              (iv) PET scan 11/18/2016 shows continuing slight improvement.  The left humeral fracture (initially documented 02/22/2015) shows continuing activity but a decreased soft tissue component.             (v) PET scan 04/26/2017 shows continuing improvement of the measurable nodular and bony disease  (b) pembrolizumab started 07/16/2015, repeated every 21 days             (i) increased TSH noted June 2018, corrected on Synthroid

## 2017-11-15 NOTE — ED Provider Notes (Signed)
Patient is refusing iv access. Will keep monitoring for any changes

## 2017-11-15 NOTE — Telephone Encounter (Signed)
Spoke with [name], pt's dtr, by phone.  She reports that Ms. Fakhouri fell on Saturday and had another incidence of fainting without fall after that.  Per [name], pt refused to go to the ED.  Pt was found to be heme postive in stool last week and started on Protonix but refused GI consult.   Per [name] pt very weak and continues to have "black stool".  Pt scheduled to see Sandi Mealy, PA and have blood work today.

## 2017-11-15 NOTE — Progress Notes (Signed)
Symptoms Management Clinic Progress Note   [name] [id number] [birth year] 84 y.o.  Danielle [name] is managed by Dr. Jana Hakim  Actively treated with chemotherapy/immunotherapy: yes  Current Therapy: Anastrozole and Keytruda  Last Treated: 11/10/2017 (cycle 40, day 1)  Assessment: Plan:    Anemia due to acute blood loss  Gastrointestinal hemorrhage, unspecified gastrointestinal hemorrhage type   Anemia secondary to GI hemorrhage: [name] is directly admitted to [facility]  She has a history of breast cancer and metastatic melanoma.  She declines a work-up of her GI bleed and reports that she will likely decline a transfusion.  She is admitted with a desire that she be discharged to a skilled nursing facility.  She declines any additional treatment for her breast cancer and metastatic melanoma.  We deeply appreciate Dr. Landis Gandy willingness to accept this patient as a direct admission.  A CBC and a chemistry panel were collected today.  Patient CBC returned with a hemoglobin of 7.5 and a hematocrit of 23.2.  Please see After Visit Summary for patient specific instructions.  Future Appointments  Date Time Provider [place]  11/22/2017 11:15 AM [facility] LAB 1 [facility] None  11/30/2017  1:15 PM [facility] LAB 1 [facility] None  11/30/2017  2:15 PM [facility] B5 [facility] None  12/28/2017 12:45 PM [facility] LAB 2 [facility] None  12/28/2017  1:00 PM [facility] [place] FLUSH [facility] None  12/28/2017  1:30 PM Causey, Charlestine Massed, NP [facility] None  12/28/2017  2:30 PM [facility] INFUSION [facility] None    No orders of the defined types were placed in this encounter.      Subjective:   Patient ID:  Danielle [name] is a 84 y.o. (DOB Jul 10, 1925) female.  Chief Complaint: No chief complaint on file.   HPI [name] is a 84 year old female with an ER positive breast cancer and a stage IV malignant melanoma who is managed by Dr.  Jana Hakim and is status post cycle 40, day 1 of Keytruda which was last dose on 11/10/2017.  The patient also has been treated with anastrozole.  She has had 2 falls in the last 2 days due to dizziness.  She also reports having dark stools.  5 days ago her hemoglobin returned at 10.1.  It returned at 7.5 today.  The patient states that she does not want a GI work-up and will likely refuse any transfusion of packed red blood cells despite the fact that progression of her anemia could place further strain on her heart.  Her only desire is to be admitted with plans for discharge to a skilled nursing facility.  The patient is a retired Marine scientist.  She expresses that she understands that further progression of her anemia could be life-threatening.  She presents to the office today with her daughter who is also a Marine scientist.  She has been living independently until around 2 weeks ago.  Her family has been staying with her over the last 2 weeks.     Medications: I have reviewed the patient's current medications.  Allergies:  Allergies  Allergen Reactions  . Vicodin [Hydrocodone-Acetaminophen] Nausea Only    Past Medical History:  Diagnosis Date  . Breast cancer ([place]) 11/15/2017  . GERD (gastroesophageal reflux disease)   . Hypertension   . Hypothyroidism   . Melanoma ([place]) 11/15/2017    Past Surgical History:  Procedure Laterality Date  . bilateral cataratct extraction    . EYE SURGERY    . HIP SURGERY    . JOINT  REPLACEMENT    . Rectal prolapse surgery      No family history on file.  Social History   Socioeconomic History  . Marital status: Widowed    Spouse name: Not on file  . Number of children: Not on file  . Years of education: Not on file  . Highest education level: Not on file  Occupational History  . Not on file  Social Needs  . Financial resource strain: Not on file  . Food insecurity:    Worry: Not on file    Inability: Not on file  . Transportation needs:    Medical: Not on  file    Non-medical: Not on file  Tobacco Use  . Smoking status: Never Smoker  . Smokeless tobacco: Never Used  Substance and Sexual Activity  . Alcohol use: No  . Drug use: No  . Sexual activity: Not on file  Lifestyle  . Physical activity:    Days per week: Not on file    Minutes per session: Not on file  . Stress: Not on file  Relationships  . Social connections:    Talks on phone: Not on file    Gets together: Not on file    Attends religious service: Not on file    Active member of club or organization: Not on file    Attends meetings of clubs or organizations: Not on file    Relationship status: Not on file  . Intimate partner violence:    Fear of current or ex partner: Not on file    Emotionally abused: Not on file    Physically abused: Not on file    Forced sexual activity: Not on file  Other Topics Concern  . Not on file  Social History Narrative  . Not on file    Past Medical History, Surgical history, Social history, and Family history were reviewed and updated as appropriate.   Please see review of systems for further details on the patient's review from today.   Review of Systems:  Review of Systems  Constitutional: Positive for fatigue.  Respiratory: Negative for cough, chest tightness and shortness of breath.   Cardiovascular: Positive for palpitations. Negative for chest pain.  Gastrointestinal: Positive for blood in stool. Negative for constipation, diarrhea, nausea and vomiting.  Neurological: Positive for dizziness.    Objective:   Physical Exam:  BP 117/62 (BP Location: Right Arm, Patient Position: Sitting)   Pulse (!) 121 Comment: dr Satira Sark aware of pulse  Temp 98.5 F (36.9 C) (Oral)   Resp 18   Ht 5\' 4"  (1.626 m)   SpO2 100%   BMI 19.64 kg/m  ECOG: 2  Physical Exam  Constitutional: No distress.  Patient is an elderly female who appears to be in no acute distress.  HENT:  Head: Normocephalic and atraumatic.  Cardiovascular: S1  normal and S2 normal. Tachycardia present.  Pulmonary/Chest: Effort normal and breath sounds normal. No stridor. No respiratory distress. She has no wheezes. She has no rales.  Abdominal: Soft. Bowel sounds are normal. She exhibits no distension. There is no tenderness. There is no guarding.  Skin: Skin is warm and dry. She is not diaphoretic.  Psychiatric: She has a normal mood and affect. Her behavior is normal. Judgment and thought content normal.    Lab Review:     Component Value Date/Time   NA 139 11/15/2017 1111   NA 139 05/18/2017 1325   K 3.5 11/15/2017 1111   K 3.9 05/18/2017 1325  CL 106 11/15/2017 1111   CO2 23 11/15/2017 1111   CO2 23 05/18/2017 1325   GLUCOSE 150 (H) 11/15/2017 1111   GLUCOSE 90 05/18/2017 1325   BUN 47 (H) 11/15/2017 1111   BUN 24.8 05/18/2017 1325   CREATININE 0.93 11/15/2017 1111   CREATININE 0.8 05/18/2017 1325   CALCIUM 9.4 11/15/2017 1111   CALCIUM 9.7 05/18/2017 1325   PROT 6.0 (L) 11/15/2017 1111   PROT 7.0 05/18/2017 1325   ALBUMIN 3.2 (L) 11/15/2017 1111   ALBUMIN 3.8 05/18/2017 1325   AST 19 11/15/2017 1111   AST 22 05/18/2017 1325   ALT 11 11/15/2017 1111   ALT 11 05/18/2017 1325   ALKPHOS 53 11/15/2017 1111   ALKPHOS 55 05/18/2017 1325   BILITOT 0.2 11/15/2017 1111   BILITOT 0.40 05/18/2017 1325   GFRNONAA 53 (L) 11/15/2017 1111   GFRAA >60 11/15/2017 1111       Component Value Date/Time   WBC 13.7 (H) 11/15/2017 1111   WBC 8.0 11/10/2017 1035   RBC 2.79 (L) 11/15/2017 1111   HGB 7.5 (L) 11/15/2017 1111   HGB 11.6 05/18/2017 1325   HCT 23.2 (L) 11/15/2017 1111   HCT 35.7 05/18/2017 1325   PLT 282 11/15/2017 1111   PLT 261 05/18/2017 1325   MCV 83.0 11/15/2017 1111   MCV 86.0 05/18/2017 1325   MCH 26.8 11/15/2017 1111   MCHC 32.3 11/15/2017 1111   RDW 13.9 11/15/2017 1111   RDW 13.4 05/18/2017 1325   LYMPHSABS 0.5 (L) 11/15/2017 1111   LYMPHSABS 0.7 (L) 05/18/2017 1325   MONOABS 1.2 (H) 11/15/2017 1111   MONOABS  0.8 05/18/2017 1325   EOSABS 0.0 11/15/2017 1111   EOSABS 0.0 05/18/2017 1325   BASOSABS 0.1 11/15/2017 1111   BASOSABS 0.0 05/18/2017 1325   -------------------------------  Imaging from last 24 hours (if applicable):  Radiology interpretation: Nm Pet Image Restage (ps) Whole Body  Result Date: 11/08/2017 CLINICAL DATA:  Subsequent treatment strategy for metastatic melanoma. History of left breast cancer. EXAM: NUCLEAR MEDICINE PET WHOLE BODY TECHNIQUE: 5.8 mCi F-18 FDG was injected intravenously. Full-ring PET imaging was performed from the skull base to thigh after the radiotracer. CT data was obtained and used for attenuation correction and anatomic localization. Fasting blood glucose: 107 mg/dl COMPARISON:  04/26/2017 FINDINGS: Mediastinal blood pool activity: SUV max 2.0 HEAD/NECK: No hypermetabolic activity in the scalp. No hypermetabolic cervical lymph nodes. Incidental CT findings: none CHEST: Similar appearance small clustered lymph nodes posterior left axilla showing hypermetabolism as before. 1.3 cm short axis posterior right axillary lymph node measured previously is 1.0 cm short axis today. SUV max = 5.1 today compared to 3.6 previously. Previously described left medial breast nodule is stable on CT imaging with SUV max = 1.9 today compared to 2.1 previously. Branching right lower lobe nodule (image 43/series 8) is similar in size measuring 8 x 9 mm today compared to 9 x 10 mm previously. This again shows low level FDG uptake with SUV max = 1.7 compared to 1.4 previously. Incidental CT findings: Coronary artery calcification is evident. Atherosclerotic calcification is noted in the wall of the thoracic aorta. Dense mitral annular calcification evident. ABDOMEN/PELVIS: No abnormal hypermetabolic activity within the liver, pancreas, adrenal glands, or spleen. No hypermetabolic lymph nodes in the abdomen or pelvis. Incidental CT findings: Homogeneous low-density central liver lesion is  photopenic on PET imaging compatible with a cyst. Large duodenal diverticulum noted. Tiny layering calcified gallstones evident. Renal cysts are evident including a  stable hyperattenuating lesion in the left kidney likely complex cyst. There is abdominal aortic atherosclerosis without aneurysm. Diverticular changes noted in the left colon without diverticulitis. SKELETON: Hypermetabolic foci again noted in the left humeral head and proximal left humerus. Index lesion in the proximal humerus demonstrates SUV max = 8.2 today compared to 10.0 previously. Hypermetabolism in the marrow of the left femoral diaphysis has decreased with SUV max = 3.8 compared to 4.9 previously. Incidental CT findings: Patient is noted be status post left total hip replacement. EXTREMITIES: No abnormal hypermetabolic activity in the lower extremities. Incidental CT findings: none IMPRESSION: 1. No substantial change in exam. The index hypermetabolic osseous lesions in the proximal left humerus and left femur show mild decrease in hypermetabolism. 2. Index posterior right axillary lymph node measures smaller on CT imaging but shows slight increase in hypermetabolism. Posterior left axillary nodes and left breast lesion are similar. 3.  Aortic Atherosclerois (ICD10-170.0) 4. Cholelithiasis Electronically Signed   By: Misty Stanley M.D.   On: 11/08/2017 14:43        This case was discussed with Dr. Jana Hakim. He expressed his agreement with my management of this patient.

## 2017-11-16 DIAGNOSIS — C50919 Malignant neoplasm of unspecified site of unspecified female breast: Secondary | ICD-10-CM

## 2017-11-16 DIAGNOSIS — K922 Gastrointestinal hemorrhage, unspecified: Secondary | ICD-10-CM | POA: Diagnosis not present

## 2017-11-16 DIAGNOSIS — D62 Acute posthemorrhagic anemia: Secondary | ICD-10-CM | POA: Diagnosis not present

## 2017-11-16 NOTE — Care Management Note (Signed)
Case Management Note  Patient Details  Name: Danielle [name] MRN: [id number] Date of Birth: [birth year]  From home alone. This CM was asked by nursing staff to talk to pt about home health. Home health services explained to pt and daughter at bedside. Choice offered, and [facility] chosen. [facility] rep alerted of referral for HHRN/PT/OT/Aide. Pt states she has a RW and 3in1 at home and doesn't need any additional equipment. Will need MD orders for HHRN/PT/OT/Aide.  Lynnell Catalan, RN 11/16/2017, 11:45 AM [phone]

## 2017-11-16 NOTE — Discharge Instructions (Signed)
Danielle [name],  You were in the hospital because of low hemoglobin. You have decided that you do not want any of this worked up. The risks have been discussed. You will be discharged home with home health.

## 2017-11-16 NOTE — Discharge Summary (Signed)
Physician Discharge Summary  Danielle [name] OIZ:[id number] DOB: 04-03-1926 DOA: 11/15/2017  PCP: Bartholome Bill, MD  Admit date: 11/15/2017 Discharge date: 11/16/2017  Admitted From: Home Disposition: Home  Recommendations for Outpatient Follow-up:  1. Follow up with PCP in 1 week 2. Please follow up on the following pending results: None  Home Health: PT Equipment/Devices: None  Discharge Condition: Stable CODE STATUS: DNR Diet recommendation: Regular diet   Brief/Interim Summary:  Admission HPI written by Georgette Shell, MD   Chief Complaint: ANEMIA  HPI: Danielle [name] is a 84 y.o. female with medical history significant of metastatic melanoma,breast cancer admitted with gi bleed.hb 7.5 today down from 10.1, 5 days ago.continues with black stools.  Denies any hematemesis has been nauseous says nothing helps with her nausea.  Denies chest pain shortness of breath cough fever or chills.  However she does not want blood transfusion or any GI work-up at this point.  She has had multiple falls prior to admission to the hospital.  She just wants to be placed in a nursing home so she does not have to bother her daughter on a day-to-day basis.  She feels that she is ready to go when the [place] call her.  She has been a widow since the age of 23.  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   Hospital course:  Acute anemia Likely secondary to GI bleeding. Patient does not want workup or blood transfusion. Continue Protonix.  Breast cancer Patient is on Arimidex as an outpatient. Discussed hospice, for which the patient is not interested in. She prefers to pay for 24/7 care at home.  Hypothyroidism Continued synthroid  Left rib pain Secondary to fall.  Discharge Diagnoses:  Active Problems:   GI bleed   Melanoma ([place])   Breast cancer [facility])    Discharge  Instructions  Discharge Instructions    Diet - low sodium heart healthy   Complete by:  As directed    Increase activity slowly   Complete by:  As directed      Allergies as of 11/16/2017      Reactions   Vicodin [hydrocodone-acetaminophen] Nausea Only      Medication List    TAKE these medications   anastrozole 1 MG tablet Commonly known as:  ARIMIDEX TAKE ONE TABLET (1 MG) BY MOUTH DAILY.   aspirin EC 81 MG tablet Take 81 mg by mouth.   cholecalciferol 1000 units tablet Commonly known as:  VITAMIN D Take 1 tablet (1,000 Units total) by mouth daily.   enalapril 10 MG tablet Commonly known as:  VASOTEC Take 1 tablet (10 mg total) by mouth daily.   levothyroxine 100 MCG tablet Commonly known as:  SYNTHROID, LEVOTHROID TAKE ONE TABLET BY MOUTH DAILY BEFORE BREAKFAST   Lutein 20 MG Caps Take by mouth.   pantoprazole 40 MG tablet Commonly known as:  PROTONIX Take 1 tablet (40 mg total) by mouth 2 (two) times daily.   RED YEAST RICE PO Take by mouth.   verapamil 240 MG CR tablet Commonly known as:  CALAN-SR Take 240 mg by mouth at bedtime.      Follow-up Information    [facility]   Specialty:  Home Health Services Contact information: [address] [place] [address] [phone]  Allergies  Allergen Reactions  . Vicodin [Hydrocodone-Acetaminophen] Nausea Only    Consultations:  None   Procedures/Studies: Nm Pet Image Restage (ps) Whole Body  Result Date: 11/08/2017 CLINICAL DATA:  Subsequent treatment strategy for metastatic melanoma. History of left breast cancer. EXAM: NUCLEAR MEDICINE PET WHOLE BODY TECHNIQUE: 5.8 mCi F-18 FDG was injected intravenously. Full-ring PET imaging was performed from the skull base to thigh after the radiotracer. CT data was obtained and used for attenuation correction and anatomic localization. Fasting blood glucose: 107 mg/dl COMPARISON:  04/26/2017 FINDINGS:  Mediastinal blood pool activity: SUV max 2.0 HEAD/NECK: No hypermetabolic activity in the scalp. No hypermetabolic cervical lymph nodes. Incidental CT findings: none CHEST: Similar appearance small clustered lymph nodes posterior left axilla showing hypermetabolism as before. 1.3 cm short axis posterior right axillary lymph node measured previously is 1.0 cm short axis today. SUV max = 5.1 today compared to 3.6 previously. Previously described left medial breast nodule is stable on CT imaging with SUV max = 1.9 today compared to 2.1 previously. Branching right lower lobe nodule (image 43/series 8) is similar in size measuring 8 x 9 mm today compared to 9 x 10 mm previously. This again shows low level FDG uptake with SUV max = 1.7 compared to 1.4 previously. Incidental CT findings: Coronary artery calcification is evident. Atherosclerotic calcification is noted in the wall of the thoracic aorta. Dense mitral annular calcification evident. ABDOMEN/PELVIS: No abnormal hypermetabolic activity within the liver, pancreas, adrenal glands, or spleen. No hypermetabolic lymph nodes in the abdomen or pelvis. Incidental CT findings: Homogeneous low-density central liver lesion is photopenic on PET imaging compatible with a cyst. Large duodenal diverticulum noted. Tiny layering calcified gallstones evident. Renal cysts are evident including a stable hyperattenuating lesion in the left kidney likely complex cyst. There is abdominal aortic atherosclerosis without aneurysm. Diverticular changes noted in the left colon without diverticulitis. SKELETON: Hypermetabolic foci again noted in the left humeral head and proximal left humerus. Index lesion in the proximal humerus demonstrates SUV max = 8.2 today compared to 10.0 previously. Hypermetabolism in the marrow of the left femoral diaphysis has decreased with SUV max = 3.8 compared to 4.9 previously. Incidental CT findings: Patient is noted be status post left total hip replacement.  EXTREMITIES: No abnormal hypermetabolic activity in the lower extremities. Incidental CT findings: none IMPRESSION: 1. No substantial change in exam. The index hypermetabolic osseous lesions in the proximal left humerus and left femur show mild decrease in hypermetabolism. 2. Index posterior right axillary lymph node measures smaller on CT imaging but shows slight increase in hypermetabolism. Posterior left axillary nodes and left breast lesion are similar. 3.  Aortic Atherosclerois (ICD10-170.0) 4. Cholelithiasis Electronically Signed   By: Misty Stanley M.D.   On: 11/08/2017 14:43      Subjective: No issues overnight. Wants to go home.  Discharge Exam: Vitals:   11/15/17 2102 11/16/17 0517  BP: (!) 135/59 (!) 116/58  Pulse: 100 89  Resp: 18 18  Temp: 98.6 F (37 C) 97.8 F (36.6 C)  SpO2: 99% 99%   Vitals:   11/15/17 1410 11/15/17 2102 11/16/17 0517  BP: (!) 154/69 (!) 135/59 (!) 116/58  Pulse: 84 100 89  Resp: 18 18 18   Temp: 97.9 F (36.6 C) 98.6 F (37 C) 97.8 F (36.6 C)  TempSrc: Oral Oral Oral  SpO2: 100% 99% 99%  Weight: 50.6 kg (111 lb 9.6 oz)    Height: 5\' 4"  (1.626 m)      General:  Pt is alert, awake, not in acute distress    The results of significant diagnostics from this hospitalization (including imaging, microbiology, ancillary and laboratory) are listed below for reference.     Microbiology: No results found for this or any previous visit (from the past 240 hour(s)).   Labs: BNP (last 3 results) No results for input(s): BNP in the last 8760 hours. Basic Metabolic Panel: Recent Labs  Lab 11/10/17 1035 11/15/17 1111  NA 140 139  K 4.0 3.5  CL 107 106  CO2 24 23  GLUCOSE 117 150*  BUN 42* 47*  CREATININE 0.80 0.93  CALCIUM 9.3 9.4   Liver Function Tests: Recent Labs  Lab 11/10/17 1035 11/15/17 1111  AST 17 19  ALT 7 11  ALKPHOS 51 53  BILITOT 0.3 0.2  PROT 6.2* 6.0*  ALBUMIN 3.5 3.2*   No results for input(s): LIPASE, AMYLASE in  the last 168 hours. No results for input(s): AMMONIA in the last 168 hours. CBC: Recent Labs  Lab 11/10/17 1035 11/15/17 1111  WBC 8.0 13.7*  NEUTROABS 6.9* 11.9*  HGB 10.1* 7.5*  HCT 30.3* 23.2*  MCV 82.9 83.0  PLT 272 282   Anemia work up Recent Labs    11/15/17 1112  FERRITIN 30  TIBC 278  IRON 11*     SIGNED:   Cordelia Poche, MD [facility] 11/16/2017, 12:15 PM

## 2017-11-16 NOTE — Progress Notes (Signed)
OT Cancellation Note  Patient Details Name: Danielle [name] MRN: [id number] DOB: 02/01/1926   Cancelled Treatment:    Reason Eval/Treat Not Completed: Other (comment). Spoke to [employer].  He just gave pt/family d/c paperwork. No DME needs. Will sign off.   Leily Capek 11/16/2017, 12:35 PM  Lesle Chris, OTR/L [phone] 11/16/2017

## 2017-11-16 NOTE — Evaluation (Signed)
Physical Therapy Evaluation Patient Details Name: Danielle [name] MRN: [id number] DOB: Nov 08, 1925 Today's Date: 11/16/2017   History of Present Illness  84 yo female admitted with GI bleed, anemia. Hx of falls, met melanoma, breast cancer, currently on chemo.   Clinical Impression  On eval, pt required Min assist for mobility. She walked ~50 feet with a rollator. Pt reported mild lightheadedness but denied dizziness. Discussed d/c plan-she stated she plans to return home with in home care. Pt stated her daughter is trying to set up 24/7 care. Pt was unsure about HHPT but I will recommend it because she could possibly benefit from continued therapy. Will follow during hospital stay.     Follow Up Recommendations Home health PT;Supervision/Assistance - 24 hour    Equipment Recommendations  None recommended by PT    Recommendations for Other Services       Precautions / Restrictions Precautions Precautions: Fall Precaution Comments: syncope + falls Restrictions Weight Bearing Restrictions: No      Mobility  Bed Mobility               General bed mobility comments: oob in recliner  Transfers Overall transfer level: Needs assistance Equipment used: 4-wheeled walker Transfers: Sit to/from Stand Sit to Stand: Min assist         General transfer comment: Assist to rise, stabilize, control descent. VCs safety, technique, hand placement. Cues for pt to stand statically to gage lightheadednes/dizziness before taking any steps.   Ambulation/Gait Ambulation/Gait assistance: Min guard Gait Distance (Feet): 50 Feet Assistive device: 4-wheeled walker Gait Pattern/deviations: Step-through pattern;Decreased stride length     General Gait Details: close guard for safety. Slow gait speed. Pt reported mild lightheadedness. She tolerated distance well.   Stairs            Wheelchair Mobility    Modified Rankin (Stroke Patients Only)       Balance Overall balance  assessment: Needs assistance;History of Falls         Standing balance support: Bilateral upper extremity supported Standing balance-Leahy Scale: Poor                               Pertinent Vitals/Pain Pain Assessment: No/denies pain    Home Living Family/patient expects to be discharged to:: Unsure Living Arrangements: Alone   Type of Home: House(townhome)       Home Layout: One level Home Equipment: Walker - 2 wheels;Cane - single point      Prior Function Level of Independence: Independent with assistive device(s)         Comments: uses RW vs cane     Hand Dominance        Extremity/Trunk Assessment   Upper Extremity Assessment Upper Extremity Assessment: Defer to OT evaluation    Lower Extremity Assessment Lower Extremity Assessment: Generalized weakness    Cervical / Trunk Assessment Cervical / Trunk Assessment: Kyphotic  Communication   Communication: No difficulties  Cognition Arousal/Alertness: Awake/alert Behavior During Therapy: WFL for tasks assessed/performed Overall Cognitive Status: Within Functional Limits for tasks assessed                                        General Comments      Exercises     Assessment/Plan    PT Assessment Patient needs continued PT services  PT Problem List Decreased  balance;Decreased mobility;Decreased strength;Decreased activity tolerance;Decreased knowledge of use of DME       PT Treatment Interventions DME instruction;Gait training;Functional mobility training;Therapeutic activities;Balance training;Patient/family education;Therapeutic exercise    PT Goals (Current goals can be found in the Care Plan section)  Acute Rehab PT Goals Patient Stated Goal: home.  PT Goal Formulation: With patient Time For Goal Achievement: 11/30/17 Potential to Achieve Goals: Fair    Frequency Min 3X/week   Barriers to discharge        Co-evaluation               AM-PAC  PT "6 Clicks" Daily Activity  Outcome Measure Difficulty turning over in bed (including adjusting bedclothes, sheets and blankets)?: A Lot Difficulty moving from lying on back to sitting on the side of the bed? : A Lot Difficulty sitting down on and standing up from a chair with arms (e.g., wheelchair, bedside commode, etc,.)?: Unable Help needed moving to and from a bed to chair (including a wheelchair)?: A Little Help needed walking in hospital room?: A Little Help needed climbing 3-5 steps with a railing? : A Lot 6 Click Score: 13    End of Session Equipment Utilized During Treatment: Gait belt Activity Tolerance: Patient tolerated treatment well Patient left: in chair;with call bell/phone within reach;with chair alarm set   PT Visit Diagnosis: Muscle weakness (generalized) (M62.81);Difficulty in walking, not elsewhere classified (R26.2);History of falling (Z91.81);Repeated falls (R29.6)    Time: 1049-1100 PT Time Calculation (min) (ACUTE ONLY): 11 min   Charges:   PT Evaluation $PT Eval Moderate Complexity: 1 Mod     PT G Codes:          Weston Anna, MPT Pager: [phone]

## 2017-11-17 ENCOUNTER — Telehealth: Payer: Self-pay | Admitting: *Deleted

## 2017-11-17 NOTE — Telephone Encounter (Signed)
This RN received VM from [place] at [facility] stating per her contact with dtr post d/c to establish care - services requested would be better provided by hospice return number left as [phone] ( no area code given ).  This RN attempted to return call - with no answer.  Per review of hospital notes, as well as call to daughter who verified pt " says she is ready for the angels to come and take her ".  "mom has for the past year told me that if something happened that could take her life- she did not want anything done and I told her I would support her "  Above reviewed with MD - this RN made referral for hospice at [facility] and [place].  Second message received from [place] with same inquiry with number left [phone].  This RN returned call to [place] - and informed her of above interaction.

## 2017-11-22 ENCOUNTER — Inpatient Hospital Stay: Payer: Medicare HMO

## 2017-11-23 ENCOUNTER — Ambulatory Visit: Payer: Medicare HMO

## 2017-11-23 ENCOUNTER — Other Ambulatory Visit: Payer: Medicare HMO

## 2017-11-29 ENCOUNTER — Other Ambulatory Visit: Payer: Self-pay | Admitting: Medical

## 2017-11-30 ENCOUNTER — Inpatient Hospital Stay: Payer: Medicare HMO

## 2017-12-10 ENCOUNTER — Other Ambulatory Visit (HOSPITAL_COMMUNITY): Payer: Medicare HMO

## 2017-12-28 ENCOUNTER — Ambulatory Visit: Payer: Medicare HMO | Admitting: Adult Health

## 2017-12-28 ENCOUNTER — Other Ambulatory Visit: Payer: Medicare HMO

## 2017-12-28 ENCOUNTER — Ambulatory Visit: Payer: Medicare HMO

## 2017-12-29 ENCOUNTER — Other Ambulatory Visit (HOSPITAL_COMMUNITY): Payer: Medicare HMO

## 2018-02-25 ENCOUNTER — Emergency Department (HOSPITAL_COMMUNITY)

## 2018-02-25 ENCOUNTER — Encounter (HOSPITAL_COMMUNITY): Payer: Self-pay | Admitting: Emergency Medicine

## 2018-02-25 ENCOUNTER — Observation Stay (HOSPITAL_COMMUNITY)
Admission: EM | Admit: 2018-02-25 | Discharge: 2018-02-26 | Disposition: A | Discharge status: Home / Self Care | Attending: Internal Medicine | Admitting: Internal Medicine

## 2018-02-25 ENCOUNTER — Other Ambulatory Visit: Payer: Self-pay

## 2018-02-25 DIAGNOSIS — R531 Weakness: Secondary | ICD-10-CM

## 2018-02-25 DIAGNOSIS — Z885 Allergy status to narcotic agent status: Secondary | ICD-10-CM | POA: Diagnosis not present

## 2018-02-25 DIAGNOSIS — Z66 Do not resuscitate: Secondary | ICD-10-CM | POA: Insufficient documentation

## 2018-02-25 DIAGNOSIS — N390 Urinary tract infection, site not specified: Secondary | ICD-10-CM | POA: Diagnosis not present

## 2018-02-25 DIAGNOSIS — Z170 Estrogen receptor positive status [ER+]: Secondary | ICD-10-CM | POA: Insufficient documentation

## 2018-02-25 DIAGNOSIS — C438 Malignant melanoma of overlapping sites of skin: Secondary | ICD-10-CM | POA: Diagnosis not present

## 2018-02-25 DIAGNOSIS — E876 Hypokalemia: Secondary | ICD-10-CM | POA: Insufficient documentation

## 2018-02-25 DIAGNOSIS — K219 Gastro-esophageal reflux disease without esophagitis: Secondary | ICD-10-CM | POA: Diagnosis not present

## 2018-02-25 DIAGNOSIS — D509 Iron deficiency anemia, unspecified: Secondary | ICD-10-CM | POA: Insufficient documentation

## 2018-02-25 DIAGNOSIS — Z681 Body mass index (BMI) 19 or less, adult: Secondary | ICD-10-CM | POA: Insufficient documentation

## 2018-02-25 DIAGNOSIS — Z8719 Personal history of other diseases of the digestive system: Secondary | ICD-10-CM | POA: Insufficient documentation

## 2018-02-25 DIAGNOSIS — E039 Hypothyroidism, unspecified: Secondary | ICD-10-CM | POA: Insufficient documentation

## 2018-02-25 DIAGNOSIS — E43 Unspecified severe protein-calorie malnutrition: Secondary | ICD-10-CM | POA: Diagnosis not present

## 2018-02-25 DIAGNOSIS — R55 Syncope and collapse: Secondary | ICD-10-CM | POA: Diagnosis not present

## 2018-02-25 DIAGNOSIS — C50212 Malignant neoplasm of upper-inner quadrant of left female breast: Secondary | ICD-10-CM | POA: Insufficient documentation

## 2018-02-25 DIAGNOSIS — I10 Essential (primary) hypertension: Secondary | ICD-10-CM | POA: Diagnosis not present

## 2018-02-25 DIAGNOSIS — D72829 Elevated white blood cell count, unspecified: Secondary | ICD-10-CM | POA: Diagnosis present

## 2018-02-25 DIAGNOSIS — N179 Acute kidney failure, unspecified: Secondary | ICD-10-CM | POA: Insufficient documentation

## 2018-02-25 DIAGNOSIS — W19XXXA Unspecified fall, initial encounter: Secondary | ICD-10-CM

## 2018-02-25 DIAGNOSIS — C50919 Malignant neoplasm of unspecified site of unspecified female breast: Secondary | ICD-10-CM

## 2018-02-25 LAB — CBC WITH DIFFERENTIAL/PLATELET
Basophils Absolute: 0 10*3/uL (ref 0.0–0.1)
Basophils Relative: 0 %
EOS ABS: 0 10*3/uL (ref 0.0–0.7)
Eosinophils Relative: 0 %
HCT: 26.7 % — ABNORMAL LOW (ref 36.0–46.0)
Hemoglobin: 7.8 g/dL — ABNORMAL LOW (ref 12.0–15.0)
Lymphocytes Relative: 2 %
Lymphs Abs: 0.4 10*3/uL — ABNORMAL LOW (ref 0.7–4.0)
MCH: 19.4 pg — ABNORMAL LOW (ref 26.0–34.0)
MCHC: 29.2 g/dL — ABNORMAL LOW (ref 30.0–36.0)
MCV: 66.4 fL — ABNORMAL LOW (ref 78.0–100.0)
MONO ABS: 0.3 10*3/uL (ref 0.1–1.0)
MONOS PCT: 2 %
NEUTROS PCT: 96 %
Neutro Abs: 20.9 10*3/uL — ABNORMAL HIGH (ref 1.7–7.7)
Platelets: 293 10*3/uL (ref 150–400)
RBC: 4.02 MIL/uL (ref 3.87–5.11)
RDW: 19.8 % — AB (ref 11.5–15.5)
WBC: 21.7 10*3/uL — ABNORMAL HIGH (ref 4.0–10.5)

## 2018-02-25 LAB — PROTIME-INR
INR: 1.05
PROTHROMBIN TIME: 13.6 s (ref 11.4–15.2)

## 2018-02-25 LAB — COMPREHENSIVE METABOLIC PANEL
ALT: 21 U/L (ref 0–44)
AST: 28 U/L (ref 15–41)
Albumin: 2.9 g/dL — ABNORMAL LOW (ref 3.5–5.0)
Alkaline Phosphatase: 84 U/L (ref 38–126)
Anion gap: 15 (ref 5–15)
BUN: 18 mg/dL (ref 8–23)
CHLORIDE: 98 mmol/L (ref 98–111)
CO2: 29 mmol/L (ref 22–32)
Calcium: 8.4 mg/dL — ABNORMAL LOW (ref 8.9–10.3)
Creatinine, Ser: 1.02 mg/dL — ABNORMAL HIGH (ref 0.44–1.00)
GFR calc Af Amer: 54 mL/min — ABNORMAL LOW (ref >60)
GFR calc non Af Amer: 47 mL/min — ABNORMAL LOW (ref >60)
GLUCOSE: 148 mg/dL — AB (ref 70–99)
POTASSIUM: 2.5 mmol/L — AB (ref 3.5–5.1)
SODIUM: 142 mmol/L (ref 135–145)
Total Bilirubin: 0.9 mg/dL (ref 0.3–1.2)
Total Protein: 6.1 g/dL — ABNORMAL LOW (ref 6.5–8.1)

## 2018-02-25 LAB — LACTIC ACID, PLASMA: Lactic Acid, Venous: 1.5 mmol/L (ref 0.5–1.9)

## 2018-02-25 LAB — MAGNESIUM: Magnesium: 1.3 mg/dL — ABNORMAL LOW (ref 1.7–2.4)

## 2018-02-25 LAB — PROCALCITONIN: Procalcitonin: 1.64 ng/mL

## 2018-02-25 MED ORDER — MAGNESIUM SULFATE 2 GM/50ML IV SOLN
2.0000 g | Freq: Once | INTRAVENOUS | Status: AC
  Administered 2018-02-25: 2 g via INTRAVENOUS
  Filled 2018-02-25: qty 50

## 2018-02-25 MED ORDER — POTASSIUM CHLORIDE CRYS ER 20 MEQ PO TBCR
40.0000 meq | EXTENDED_RELEASE_TABLET | Freq: Once | ORAL | Status: AC
  Administered 2018-02-25: 40 meq via ORAL
  Filled 2018-02-25: qty 2

## 2018-02-25 MED ORDER — ACETAMINOPHEN 650 MG RE SUPP: 650.0000 mg | Freq: Four times a day (QID) | RECTAL | Status: DC | PRN

## 2018-02-25 MED ORDER — SODIUM CHLORIDE 0.9 % IV SOLN
INTRAVENOUS | Status: DC
  Administered 2018-02-25 – 2018-02-26 (×2): via INTRAVENOUS

## 2018-02-25 MED ORDER — HYDROXYZINE HCL 10 MG PO TABS: 10.0000 mg | ORAL_TABLET | Freq: Three times a day (TID) | ORAL | Status: DC | PRN

## 2018-02-25 MED ORDER — SODIUM CHLORIDE 0.9 % IV BOLUS
1000.0000 mL | Freq: Once | INTRAVENOUS | Status: AC
  Administered 2018-02-25: 1000 mL via INTRAVENOUS

## 2018-02-25 MED ORDER — ZOLPIDEM TARTRATE 5 MG PO TABS: 5.0000 mg | ORAL_TABLET | Freq: Every evening | ORAL | Status: DC | PRN

## 2018-02-25 MED ORDER — SENNOSIDES-DOCUSATE SODIUM 8.6-50 MG PO TABS: 1.0000 | ORAL_TABLET | Freq: Every evening | ORAL | Status: DC | PRN

## 2018-02-25 MED ORDER — POTASSIUM CHLORIDE 20 MEQ/15ML (10%) PO SOLN
40.0000 meq | Freq: Once | ORAL | Status: AC
  Administered 2018-02-25: 40 meq via ORAL
  Filled 2018-02-25: qty 30

## 2018-02-25 MED ORDER — FERROUS SULFATE 325 (65 FE) MG PO TABS
325.0000 mg | ORAL_TABLET | Freq: Two times a day (BID) | ORAL | Status: DC
  Administered 2018-02-26: 325 mg via ORAL
  Filled 2018-02-25: qty 1

## 2018-02-25 MED ORDER — IBUPROFEN 200 MG PO TABS
600.0000 mg | ORAL_TABLET | Freq: Once | ORAL | Status: AC
  Administered 2018-02-25: 600 mg via ORAL
  Filled 2018-02-25: qty 3

## 2018-02-25 MED ORDER — POTASSIUM CHLORIDE 10 MEQ/100ML IV SOLN
10.0000 meq | INTRAVENOUS | Status: AC
  Administered 2018-02-25: 10 meq via INTRAVENOUS
  Filled 2018-02-25 (×2): qty 100

## 2018-02-25 MED ORDER — AMLODIPINE BESYLATE 5 MG PO TABS
5.0000 mg | ORAL_TABLET | Freq: Every day | ORAL | Status: DC
  Administered 2018-02-25 – 2018-02-26 (×2): 5 mg via ORAL
  Filled 2018-02-25 (×2): qty 1

## 2018-02-25 MED ORDER — ACETAMINOPHEN 325 MG PO TABS
650.0000 mg | ORAL_TABLET | Freq: Once | ORAL | Status: AC
  Administered 2018-02-25: 650 mg via ORAL
  Filled 2018-02-25: qty 2

## 2018-02-25 MED ORDER — LEVOTHYROXINE SODIUM 100 MCG PO TABS
100.0000 ug | ORAL_TABLET | Freq: Every day | ORAL | Status: DC
  Administered 2018-02-26: 100 ug via ORAL
  Filled 2018-02-25: qty 1

## 2018-02-25 MED ORDER — ACETAMINOPHEN 325 MG PO TABS
650.0000 mg | ORAL_TABLET | Freq: Four times a day (QID) | ORAL | Status: DC | PRN
  Administered 2018-02-26: 650 mg via ORAL
  Filled 2018-02-25: qty 2

## 2018-02-25 MED ORDER — PANTOPRAZOLE SODIUM 40 MG PO TBEC
40.0000 mg | DELAYED_RELEASE_TABLET | Freq: Two times a day (BID) | ORAL | Status: DC
  Administered 2018-02-25 – 2018-02-26 (×2): 40 mg via ORAL
  Filled 2018-02-25 (×2): qty 1

## 2018-02-25 NOTE — ED Provider Notes (Signed)
[facility] DEPT Provider Note   CSN: [id number] Arrival date & time: 02/25/18  1604     History   Chief Complaint Chief Complaint  Patient presents with  . Loss of Consciousness  . Fall    HPI Danielle [name] is a 84 y.o. female.  The history is provided by the patient, medical records and a relative (HCPOA on phone). No language interpreter was used.  Loss of Consciousness   This is a recurrent problem. The current episode started 1 to 2 hours ago. The problem occurs rarely. The problem has been resolved. She lost consciousness for a period of less than one minute. The problem is associated with standing up. Associated symptoms include light-headedness. Pertinent negatives include abdominal pain, back pain, chest pain, confusion, congestion, diaphoresis, dizziness, fever, headaches, malaise/fatigue, nausea, palpitations, seizures, slurred speech, vertigo, visual change, vomiting and weakness. She has tried nothing for the symptoms. The treatment provided no relief.    Past Medical History:  Diagnosis Date  . Breast cancer ([place]) 11/15/2017  . GERD (gastroesophageal reflux disease)   . Hypertension   . Hypothyroidism   . Melanoma ([place]) 11/15/2017    Patient Active Problem List   Diagnosis Date Noted  . GI bleed 11/15/2017  . Melanoma ([place]) 11/15/2017  . Breast cancer ([place]) 11/15/2017  . Osteoporosis 11/25/2016  . Thoracic aorta atherosclerosis ([place]) 07/12/2015  . Malignant melanoma of overlapping sites ([place]) 07/01/2015  . Malignant neoplasm of upper-inner quadrant of left breast in female, estrogen receptor positive ([place]) 07/01/2015  . Aortic atherosclerosis ([place]) 07/01/2015  . Diverticulosis of colon without hemorrhage 07/01/2015  . Cholelithiasis 07/01/2015  . Liver metastases ([place]) 07/01/2015  . HYPERLIPIDEMIA 11/26/2006  . ANXIETY 11/26/2006  . HYPERTENSION 11/26/2006  . ABSCESS, LIVER 11/26/2006  . OSTEOARTHRITIS 11/26/2006    Past  Surgical History:  Procedure Laterality Date  . bilateral cataratct extraction    . EYE SURGERY    . HIP SURGERY    . JOINT REPLACEMENT    . Rectal prolapse surgery       OB History   None      Home Medications    Prior to Admission medications   Medication Sig Start Date End Date Taking? Authorizing Provider  anastrozole (ARIMIDEX) 1 MG tablet TAKE ONE TABLET (1 MG) BY MOUTH DAILY. 08/16/17   Magrinat, Virgie Dad, MD  aspirin EC 81 MG tablet Take 81 mg by mouth.  11/16/13   [provider]  cholecalciferol (VITAMIN D) 1000 units tablet Take 1 tablet (1,000 Units total) by mouth daily. 11/25/16   Magrinat, Virgie Dad, MD  enalapril (VASOTEC) 10 MG tablet Take 1 tablet (10 mg total) by mouth daily. 08/12/16   Magrinat, Virgie Dad, MD  levothyroxine (SYNTHROID, LEVOTHROID) 100 MCG tablet TAKE ONE TABLET BY MOUTH DAILY BEFORE BREAKFAST 07/05/17   Magrinat, Virgie Dad, MD  Lutein 20 MG CAPS Take by mouth.    [provider]  pantoprazole (PROTONIX) 40 MG tablet Take 1 tablet (40 mg total) by mouth 2 (two) times daily. 11/12/17   Gardenia Phlegm, NP  Red Yeast Rice Extract (RED YEAST RICE PO) Take by mouth.    [provider]  verapamil (CALAN-SR) 240 MG CR tablet Take 240 mg by mouth at bedtime.  05/09/15   [provider]    Family History History reviewed. No pertinent family history.  Social History Social History   Tobacco Use  . Smoking status: Never Smoker  . Smokeless tobacco: Never  Used  Substance Use Topics  . Alcohol use: No  . Drug use: No     Allergies   Vicodin [hydrocodone-acetaminophen]   Review of Systems Review of Systems  Constitutional: Negative for chills, diaphoresis, fatigue, fever and malaise/fatigue.  HENT: Negative for congestion.   Respiratory: Negative for cough, chest tightness, shortness of breath and wheezing.   Cardiovascular: Positive for syncope. Negative for chest pain, palpitations and leg swelling.    Gastrointestinal: Negative for abdominal pain, constipation, diarrhea, nausea and vomiting.  Genitourinary: Negative for flank pain.  Musculoskeletal: Negative for back pain, neck pain and neck stiffness.  Skin: Negative for rash and wound.  Neurological: Positive for syncope and light-headedness. Negative for dizziness, vertigo, seizures, weakness, numbness and headaches.  Psychiatric/Behavioral: Negative for confusion.  All other systems reviewed and are negative.    Physical Exam Updated Vital Signs There were no vitals taken for this visit.  Physical Exam  Constitutional: She is oriented to person, place, and time. She appears well-developed and well-nourished. No distress.  HENT:  Head: Normocephalic and atraumatic.  Nose: Nose normal.  Mouth/Throat: Oropharynx is clear and moist. No oropharyngeal exudate.  Eyes: Pupils are equal, round, and reactive to light. Conjunctivae and EOM are normal.  Neck: Normal range of motion. Neck supple.  Cardiovascular: Normal rate and regular rhythm.  No murmur heard. Pulmonary/Chest: Effort normal and breath sounds normal. No respiratory distress. She has no wheezes. She has no rales. She exhibits no tenderness.  Abdominal: Soft. There is no tenderness. There is no guarding.  Musculoskeletal: She exhibits tenderness. She exhibits no edema.       Right shoulder: She exhibits tenderness and pain. She exhibits normal range of motion, no swelling, no crepitus, no deformity and no laceration.       Arms: Mild tenderness on shoulder.  Normal sensation grip strength and pulses in bilateral upper extremities.  Normal shoulder range of motion.  Neurological: She is alert and oriented to person, place, and time. No sensory deficit. She exhibits normal muscle tone.  Skin: Skin is warm and dry. Capillary refill takes less than 2 seconds. No rash noted. She is not diaphoretic. No erythema.  Psychiatric: She has a normal mood and affect.  Nursing note  and vitals reviewed.    ED Treatments / Results  Labs (all labs ordered are listed, but only abnormal results are displayed) Labs Reviewed  CBC WITH DIFFERENTIAL/PLATELET - Abnormal; Notable for the following components:      Result Value   WBC 21.7 (*)    Hemoglobin 7.8 (*)    HCT 26.7 (*)    MCV 66.4 (*)    MCH 19.4 (*)    MCHC 29.2 (*)    RDW 19.8 (*)    Neutro Abs 20.9 (*)    Lymphs Abs 0.4 (*)    All other components within normal limits  COMPREHENSIVE METABOLIC PANEL - Abnormal; Notable for the following components:   Potassium 2.5 (*)    Glucose, Bld 148 (*)    Creatinine, Ser 1.02 (*)    Calcium 8.4 (*)    Total Protein 6.1 (*)    Albumin 2.9 (*)    GFR calc non Af Amer 47 (*)    GFR calc Af Amer 54 (*)    All other components within normal limits  MAGNESIUM - Abnormal; Notable for the following components:   Magnesium 1.3 (*)    All other components within normal limits  URINE CULTURE  CULTURE, BLOOD (ROUTINE X  2)  CULTURE, BLOOD (ROUTINE X 2)  LACTIC ACID, PLASMA  PROCALCITONIN  PROTIME-INR  URINALYSIS, ROUTINE W REFLEX MICROSCOPIC  LACTIC ACID, PLASMA  MAGNESIUM  BASIC METABOLIC PANEL  CBC    EKG EKG Interpretation  Date/Time:  Friday February 25 2018 17:15:19 EDT Ventricular Rate:  105 PR Interval:    QRS Duration: 102 QT Interval:  353 QTC Calculation: 467 R Axis:   72 Text Interpretation:  Sinus or ectopic atrial tachycardia Left ventricular hypertrophy Inferior infarct, old Anterior infarct, old No prior ECG for comparison.  No STEMI Confirmed by Antony Blackbird [phone]) on 02/25/2018 7:25:34 PM   Radiology No results found.  Procedures Procedures (including critical care time)  Medications Ordered in ED Medications  potassium chloride 10 mEq in 100 mL IVPB ( Intravenous Canceled Entry 02/25/18 2300)  0.9 %  sodium chloride infusion ( Intravenous New Bag/Given 02/25/18 2257)  acetaminophen (TYLENOL) tablet 650 mg (has no administration  in time range)    Or  acetaminophen (TYLENOL) suppository 650 mg (has no administration in time range)  senna-docusate (Senokot-S) tablet 1 tablet (has no administration in time range)  hydrOXYzine (ATARAX/VISTARIL) tablet 10 mg (has no administration in time range)  zolpidem (AMBIEN) tablet 5 mg (has no administration in time range)  levothyroxine (SYNTHROID, LEVOTHROID) tablet 100 mcg (has no administration in time range)  pantoprazole (PROTONIX) EC tablet 40 mg (40 mg Oral Given 02/25/18 2302)  amLODipine (NORVASC) tablet 5 mg (5 mg Oral Given 02/25/18 2311)  ferrous sulfate tablet 325 mg (has no administration in time range)  sodium chloride 0.9 % bolus 1,000 mL (1,000 mLs Intravenous New Bag/Given 02/25/18 1738)  sodium chloride 0.9 % bolus 1,000 mL (1,000 mLs Intravenous New Bag/Given 02/25/18 1939)  acetaminophen (TYLENOL) tablet 650 mg (650 mg Oral Given 02/25/18 1934)  ibuprofen (ADVIL,MOTRIN) tablet 600 mg (600 mg Oral Given 02/25/18 1934)  magnesium sulfate IVPB 2 g 50 mL (2 g Intravenous New Bag/Given 02/25/18 2315)  potassium chloride SA (K-DUR,KLOR-CON) CR tablet 40 mEq (40 mEq Oral Given 02/25/18 1933)  potassium chloride 20 MEQ/15ML (10%) solution 40 mEq (40 mEq Oral Given 02/25/18 2303)     Initial Impression / Assessment and Plan / ED Course  I have reviewed the triage vital signs and the nursing notes.  Pertinent labs & imaging results that were available during my care of the patient were reviewed by me and considered in my medical decision making (see chart for details).     Danielle [name] is a 84 y.o. female currently on hospice for cancer, hypertension, hyperlipidemia, and hypothyroidism who presents with a syncopal episode.  Patient is coming by family who reports that patient had an unwitnessed fall this afternoon and patient says that she stood up and got lightheaded before passing out.  She reports that she fell into a drywall wall hitting her right shoulder before going  to the ground.  She reports she was out for several seconds.  She denies hitting her head, headaches, vision changes, nausea, vomiting.  She denies any preceding symptoms including no palpitations, chest pain, shortness of breath.  She denies recent fevers, chills, congestion, cough, shortness of breath, urinary symptoms, or GI symptoms.  She otherwise only reports her left knee gives her trouble from time to time but is not currently bothering her.  She reports she was having pain in her upper back with transfer but currently has no pain.  Next  On exam, patient had no spinal or neck tenderness.  Patient  had minimal tenderness in her right shoulder with movement.  Normal sensation and strength bilaterally in the arms and legs.  Normal radial pulses bilaterally.  No chest wall tenderness.  Pelvis nontender.  Symmetric leg strength and sensation.  No focal neurologic deficits initially seen.  I spoke on the phone with the patient's daughter who is a healthcare power of attorney with whom he shared decision in conversation was held as to direction of work-up.  They did not want extensive CT imaging for this fall but agreed to a shoulder x-ray with the patient was hurting as well as a chest x-ray given the syncope and upper back pain.  They agree to some screening blood work, rehydration, and urinalysis.  They were in agreement that if patient's work-up is reassuring, patient will be stable for discharge home.  They agreed to hold pain medicine was patient was in severe pain.  Patient currently denying severe pain.  Anticipate reassessment.      7:13 PM Patient's laboratory testing began to return.  Patient found a leukocytosis of 21.7 with a left shift.  Patient is similarly anemic to prior.  Patient has a kidney function of 1.02 which is slightly more elevated but more concerning had a potassium of 2.5.  Patient's magnesium also low.    Patient initially refused chest x-ray and shoulder x-ray.  She refused  urine.  Spoke with patient and the healthcare power of attorney daughter on results.  I expressed concern about the patient having a syncopal episode with these electrode imbalances given her decreased oral intake.  I am also still concerned about occult infection either in the lungs or the urine.  Given her age, syncope, and elect light abnormalities, I am concerned about the patient safely going home where she lives alone.  Patient will be called for admission for syncope in the setting of electrolyte imbalance.  She will be given oral and IV potassium, magnesium, and more fluids as she is still tachycardic in the 105-110 range.  We will attempt to get an EKG and the other labs however I still feel she needs admission even if we have not yet found an infection.   Final Clinical Impressions(s) / ED Diagnoses   Final diagnoses:  Syncope and collapse  Hypokalemia  Hypomagnesemia    ED Discharge Orders    None      Clinical Impression: 1. Syncope and collapse   2. Hypokalemia   3. Hypomagnesemia     Disposition: Admit  This note was prepared with assistance of Dragon voice recognition software. Occasional wrong-word or sound-a-like substitutions may have occurred due to the inherent limitations of voice recognition software.     Tegeler, Gwenyth Allegra, MD 02/26/18 [phone]

## 2018-02-25 NOTE — ED Notes (Signed)
Patient has refused X-rays

## 2018-02-25 NOTE — ED Triage Notes (Signed)
Current hospice pt with cancer hx. Very weak today. Said she felt like she would black out and lost consciousness for a minute or two and fell on her right shoulder. 18G RAC. EKG unremarkable en route. Has long hx of HTN, but apparently doesn't take anything for it.

## 2018-02-25 NOTE — ED Notes (Signed)
Date and time results received: 02/25/18 1842 (use smartphrase ".now" to insert current time)  Test: K+ Critical Value: 2.5  Name of Provider Notified: Tegeler and Ubaldo Glassing  Orders Received? Or Actions Taken?: Actions Taken: Arts development officer and provider

## 2018-02-25 NOTE — H&P (Addendum)
History and Physical    Danielle [name] ZCH:[id number] DOB: [birth year] DOA: 02/25/2018  Referring MD/NP/PA:   PCP: Bartholome Bill, MD   Patient coming from:  The patient is coming from home.  At baseline, pt is partially dependent for most of ADL.       Chief Complaint: Generalized weakness, syncope, fall  HPI: Danielle [name] is a 84 y.o. female with medical history significant of hospice care, hypertension, GERD, hypothyroidism, breast cancer, melanoma, GI bleeding due to gastric ulcer, who presents with generalized weakness, syncope and fall.  Pt state that he has been having generalized weakness in the past several days, which has been progressively getting worse.  He has decreased appetite and oral intake. He has dizziness and lightheadedness, but no unilateral numbness or tingling his extremities.  No vision change, hearing loss.  No facial droop or slurred speech.  Patient states that she fell when she stood up and felt dizzy in this morning.  She states that she passed out for few seconds when she fell.  She denies any injury to the head and neck.  No headache or neck pain.  She has bruise over her right shoulder. Patient denies chest pain, shortness breath, cough, fever or chills.  No nausea, vomiting, diarrhea or abdominal pain.  No symptoms of UTI.  ED Course: pt was found to have WBC 21.7, pending urinalysis, potassium 2.5, AKI with creatinine 1.02, BUN 18, GFR 54, pending temperature, tachycardia, tachypnea, oxygen saturation 96% on room air.  Pending chest x-ray, x-ray of right shoulder.  Patient is placed on telemetry bed for observation.  Review of Systems:   General: no fevers, chills, no body weight gain, has poor appetite, has fatigue HEENT: no blurry vision, hearing changes or sore throat Respiratory: no dyspnea, coughing, wheezing CV: no chest pain, no palpitations GI: no nausea, vomiting, abdominal pain, diarrhea, constipation GU: no dysuria, burning on urination,  increased urinary frequency, hematuria  Ext: no leg edema Neuro: no unilateral weakness, numbness, or tingling, no vision change or hearing loss.  Has dizziness, lightheadedness and syncope Skin: has bruise in right shoulder MSK: No muscle spasm, no deformity, no limitation of range of movement in spin Heme: No easy bruising.  Travel history: No recent long distant travel.  Allergy:  Allergies  Allergen Reactions  . Ativan [Lorazepam]   . Hydromorphone     Very small doses.  . Morphine And Related     Causes Nausea & Vomiting  . Vicodin [Hydrocodone-Acetaminophen] Nausea Only  . Zofran [Ondansetron Hcl] Nausea And Vomiting    Past Medical History:  Diagnosis Date  . Breast cancer ([place]) 11/15/2017  . GERD (gastroesophageal reflux disease)   . Hypertension   . Hypothyroidism   . Melanoma ([place]) 11/15/2017    Past Surgical History:  Procedure Laterality Date  . bilateral cataratct extraction    . EYE SURGERY    . HIP SURGERY    . JOINT REPLACEMENT    . Rectal prolapse surgery      Social History:  reports that she has never smoked. She has never used smokeless tobacco. She reports that she does not drink alcohol or use drugs.  Family History: Reviewed with patient, patient is not very clear about family medical history.  Prior to Admission medications   Medication Sig Start Date End Date Taking? Authorizing Provider  acetaminophen (TYLENOL) 500 MG tablet Take 500 mg by mouth every 6 (six) hours as needed for moderate pain.   Yes [provider]  ibuprofen (ADVIL,MOTRIN) 200 MG tablet Take 800 mg by mouth daily as needed for moderate pain.   Yes [provider]  levothyroxine (SYNTHROID, LEVOTHROID) 100 MCG tablet TAKE ONE TABLET BY MOUTH DAILY BEFORE BREAKFAST Patient taking differently: Take 100 mcg by mouth daily before breakfast.  07/05/17  Yes Magrinat, Virgie Dad, MD  pantoprazole (PROTONIX) 40 MG tablet Take 1 tablet (40 mg total) by mouth 2 (two) times  daily. 11/12/17  Yes Causey, Charlestine Massed, NP  anastrozole (ARIMIDEX) 1 MG tablet TAKE ONE TABLET (1 MG) BY MOUTH DAILY. Patient not taking: Reported on 02/25/2018 08/16/17   Magrinat, Virgie Dad, MD  cholecalciferol (VITAMIN D) 1000 units tablet Take 1 tablet (1,000 Units total) by mouth daily. Patient not taking: Reported on 02/25/2018 11/25/16   Magrinat, Virgie Dad, MD  enalapril (VASOTEC) 10 MG tablet Take 1 tablet (10 mg total) by mouth daily. Patient not taking: Reported on 02/25/2018 08/12/16   Chauncey Cruel, MD    Physical Exam: Vitals:   02/25/18 2100 02/25/18 2137 02/25/18 2311 02/26/18 0310  BP:  (!) 180/82 (!) 180/82 (!) 165/72  Pulse:  100  93  Resp:      Temp:  98.7 F (37.1 C)    TempSrc:  Oral    SpO2:  93%  90%  Weight: 47.7 kg     Height: 5\' 5"  (1.651 m)      General: Not in acute distress. Very thin body habitus. HEENT:       Eyes: PERRL, EOMI, no scleral icterus.       ENT: No discharge from the ears and nose, no pharynx injection, no tonsillar enlargement.        Neck: No JVD, no bruit, no mass felt. Heme: No neck lymph node enlargement. Cardiac: S1/S2, RRR, No murmurs, No gallops or rubs. Respiratory: No rales, wheezing, rhonchi or rubs. GI: Soft, nondistended, nontender, no rebound pain, no organomegaly, BS present. GU: No hematuria Ext: No pitting leg edema bilaterally. 2+DP/PT pulse bilaterally. Musculoskeletal: No joint deformities, No joint redness or warmth, no limitation of ROM in spin. Skin: No rashes. Has bruise in right shoulder Neuro: Alert, oriented X3, cranial nerves II-XII grossly intact, moves all extremities normally. Psych: Patient is not psychotic, no suicidal or hemocidal ideation.  Labs on Admission: I have personally reviewed following labs and imaging studies  CBC: Recent Labs  Lab 02/25/18 1717  WBC 21.7*  NEUTROABS 20.9*  HGB 7.8*  HCT 26.7*  MCV 66.4*  PLT 016   Basic Metabolic Panel: Recent Labs  Lab 02/25/18 1717    NA 142  K 2.5*  CL 98  CO2 29  GLUCOSE 148*  BUN 18  CREATININE 1.02*  CALCIUM 8.4*  MG 1.3*   GFR: Estimated Creatinine Clearance: 27.6 mL/min (A) (by C-G formula based on SCr of 1.02 mg/dL (H)). Liver Function Tests: Recent Labs  Lab 02/25/18 1717  AST 28  ALT 21  ALKPHOS 84  BILITOT 0.9  PROT 6.1*  ALBUMIN 2.9*   No results for input(s): LIPASE, AMYLASE in the last 168 hours. No results for input(s): AMMONIA in the last 168 hours. Coagulation Profile: Recent Labs  Lab 02/25/18 2152  INR 1.05   Cardiac Enzymes: No results for input(s): CKTOTAL, CKMB, CKMBINDEX, TROPONINI in the last 168 hours. BNP (last 3 results) No results for input(s): PROBNP in the last 8760 hours. HbA1C: No results for input(s): HGBA1C in the last 72 hours. CBG: No results for input(s): GLUCAP in the last  168 hours. Lipid Profile: No results for input(s): CHOL, HDL, LDLCALC, TRIG, CHOLHDL, LDLDIRECT in the last 72 hours. Thyroid Function Tests: No results for input(s): TSH, T4TOTAL, FREET4, T3FREE, THYROIDAB in the last 72 hours. Anemia Panel: No results for input(s): VITAMINB12, FOLATE, FERRITIN, TIBC, IRON, RETICCTPCT in the last 72 hours. Urine analysis:    Component Value Date/Time   COLORURINE YELLOW 02/26/2018 0425   APPEARANCEUR HAZY (A) 02/26/2018 0425   LABSPEC 1.009 02/26/2018 0425   PHURINE 6.0 02/26/2018 0425   GLUCOSEU NEGATIVE 02/26/2018 0425   HGBUR MODERATE (A) 02/26/2018 0425   BILIRUBINUR NEGATIVE 02/26/2018 0425   KETONESUR NEGATIVE 02/26/2018 0425   PROTEINUR 30 (A) 02/26/2018 0425   NITRITE NEGATIVE 02/26/2018 0425   LEUKOCYTESUR SMALL (A) 02/26/2018 0425   Sepsis Labs: @LABRCNTIP (procalcitonin:4,lacticidven:4) )No results found for this or any previous visit (from the past 240 hour(s)).   Radiological Exams on Admission: No results found.   EKG: Independently reviewed.  Sinus rhythm, QTC 467, inferior infarction pattern, anteroseptal infarction  pattern   Assessment/Plan Principal Problem:   Generalized weakness Active Problems:   Essential hypertension   Malignant melanoma of overlapping sites [facility])   Malignant neoplasm of upper-inner quadrant of left breast in female, estrogen receptor positive (HCC)   Breast cancer (HCC)   Syncope   Hypokalemia   Hypomagnesemia   AKI (acute kidney injury) ([place])   Fall   Protein-calorie malnutrition, severe (HCC)   Leukocytosis   Microcytic anemia   UTI (urinary tract infection)   Generalized weakness: Likely multifactorial etiology, including electrolyte disturbance, AKI, malnutrition. -Placed on telemetry bed of observation -PT/OT -Electrolytes disturbance will be corrected -Nutrition consult  Hypokalemia and hypomagnesemia: K= 2.5 and Mg 1.3  on admission. - Repleted both - f/u BMP and Mg in AM  Essential hypertension: Blood pressure elevated at 188/85.  Patient is not taking medications at home. -Start amlodipine 5 mg daily -IV hydralazine as needed  Syncope and fall: Etiology is not clear, may be due to dehydration and a possible orthostatic status.  No focal neurologic findings on physical examination.  Patient essentially refused any further work-up including any image for head and neck. She states that "I am fine". -Check orthostatic vital sign - IV fluid: 2 L normal saline, followed by 75 cc/h -PT/OT -f/u X-ray of R shoulder  Malignant melanoma of overlapping sites [facility]) and Malignant neoplasm of upper-inner quadrant of left breast in female, estrogen receptor positive [facility]): pt is followed by oncology, Dr. Jana Hakim.  Currently patient is not taking any medications. -Follow-up with Dr. Jana Hakim -pt is on hospice care.  Per ED physician, patient's daughter called hospice team, they will see patient in the morning.  AKI: Cre 1.02, BUN 18 and GFR 54, Likely due to dehydration.  - IVF as above - Follow up renal function by BMP  Protein-calorie malnutrition, severe  ([place]): -Nutrition consult  Microcytic anemia: pt has GIB due to gastric ulcer disease recently.  Patient had anemia panel on 11/15/2017, which showed iron 11, saturation ratio 4, ferritin 30 -Start ferrous sulfate -PRN senna "  Leukocytosis: WBC 21.7. Temperature not measured yet.  Patient denies respiratory symptoms or symptoms of UTI. -will check body temperature -Follow-up urinalysis, blood culture, urine culture -Follow-up chest x-ray  Addendum: UA result is consistent with UTI. -start IV rocephin  DVT ppx: SCD Code Status: DNR (I discussed with patient in the presence of son-in law, and explained the meaning of CODE STATUS. Patient wants to be DNR). Family Communication:  Yes, patient's son-in law    at bed side Disposition Plan:  Anticipate discharge back to previous home environment Consults called:  none Admission status: Obs / tele      Date of Service 02/26/2018    Ivor Costa [facility] Pager [phone]  If 7PM-7AM, please contact night-coverage [email or web address] Password [email or web address] 02/26/2018, 5:30 AM

## 2018-02-25 NOTE — ED Notes (Signed)
ED TO INPATIENT HANDOFF REPORT  Name/Age/Gender Danielle [name] 84 y.o. female  Code Status    Code Status Orders  (From admission, onward)         Start     Ordered   02/25/18 1956  Do not attempt resuscitation (DNR)  Continuous    Question Answer Comment  In the event of cardiac or respiratory ARREST Do not call a "code blue"   In the event of cardiac or respiratory ARREST Do not perform Intubation, CPR, defibrillation or ACLS   In the event of cardiac or respiratory ARREST Use medication by any route, position, wound care, and other measures to relive pain and suffering. May use oxygen, suction and manual treatment of airway obstruction as needed for comfort.      02/25/18 1956        Code Status History    Date Active Date Inactive Code Status Order ID Comments User Context   11/15/2017 1627 11/16/2017 1649 DNR [id number]  Georgette Shell, MD Inpatient      Home/SNF/Other Home  Chief Complaint syncope fall   Level of Care/Admitting Diagnosis ED Disposition    ED Disposition Condition [place] [facility] Area: [facility] [[id number]]  Level of Care: Telemetry [5]  Admit to tele based on following criteria: Other see comments  Admit to tele based on following criteria: Eval of Syncope  Comments: syncope  Diagnosis: Generalized weakness [220254]  Admitting Physician: Ivor Costa [[id number]]  Attending Physician: Ivor Costa [[id number]]  PT Class (Do Not Modify): Observation [104]  PT Acc Code (Do Not Modify): Observation [10022]       Medical History Past Medical History:  Diagnosis Date  . Breast cancer ([place]) 11/15/2017  . GERD (gastroesophageal reflux disease)   . Hypertension   . Hypothyroidism   . Melanoma ([place]) 11/15/2017    Allergies Allergies  Allergen Reactions  . Ativan [Lorazepam]   . Hydromorphone     Very small doses.  . Morphine And Related     Causes Nausea & Vomiting  . Vicodin [Hydrocodone-Acetaminophen] Nausea Only  .  Zofran [Ondansetron Hcl] Nausea And Vomiting    IV Location/Drains/Wounds Patient Lines/Drains/Airways Status   Active Line/Drains/Airways    Name:   Placement date:   Placement time:   Site:   Days:   Peripheral IV 02/25/18 Right Antecubital   02/25/18    1615    Antecubital   less than 1          Labs/Imaging Results for orders placed or performed during the hospital encounter of 02/25/18 (from the past 48 hour(s))  CBC with Differential     Status: Abnormal   Collection Time: 02/25/18  5:17 PM  Result Value Ref Range   WBC 21.7 (H) 4.0 - 10.5 K/uL   RBC 4.02 3.87 - 5.11 MIL/uL   Hemoglobin 7.8 (L) 12.0 - 15.0 g/dL   HCT 26.7 (L) 36.0 - 46.0 %   MCV 66.4 (L) 78.0 - 100.0 fL   MCH 19.4 (L) 26.0 - 34.0 pg   MCHC 29.2 (L) 30.0 - 36.0 g/dL   RDW 19.8 (H) 11.5 - 15.5 %   Platelets 293 150 - 400 K/uL   Neutrophils Relative % 96 %   Neutro Abs 20.9 (H) 1.7 - 7.7 K/uL   Lymphocytes Relative 2 %   Lymphs Abs 0.4 (L) 0.7 - 4.0 K/uL   Monocytes Relative 2 %   Monocytes Absolute 0.3 0.1 - 1.0 K/uL  Eosinophils Relative 0 %   Eosinophils Absolute 0.0 0.0 - 0.7 K/uL   Basophils Relative 0 %   Basophils Absolute 0.0 0.0 - 0.1 K/uL    Comment: Performed at [facility], [place] [address] [place], [place] [address]  Comprehensive metabolic panel     Status: Abnormal   Collection Time: 02/25/18  5:17 PM  Result Value Ref Range   Sodium 142 135 - 145 mmol/L   Potassium 2.5 (LL) 3.5 - 5.1 mmol/L    Comment: CRITICAL RESULT CALLED TO, READ BACK BY AND VERIFIED WITH: CLAPP,S. RN _0  ON 09.27.19 BY COHEN,K    Chloride 98 98 - 111 mmol/L   CO2 29 22 - 32 mmol/L   Glucose, Bld 148 (H) 70 - 99 mg/dL   BUN 18 8 - 23 mg/dL   Creatinine, Ser 1.02 (H) 0.44 - 1.00 mg/dL   Calcium 8.4 (L) 8.9 - 10.3 mg/dL   Total Protein 6.1 (L) 6.5 - 8.1 g/dL   Albumin 2.9 (L) 3.5 - 5.0 g/dL   AST 28 15 - 41 U/L   ALT 21 0 - 44 U/L   Alkaline Phosphatase 84 38 - 126 U/L   Total  Bilirubin 0.9 0.3 - 1.2 mg/dL   GFR calc non Af Amer 47 (L) >60 mL/min   GFR calc Af Amer 54 (L) >60 mL/min    Comment: (NOTE) The eGFR has been calculated using the CKD EPI equation. This calculation has not been validated in all clinical situations. eGFR's persistently <60 mL/min signify possible Chronic Kidney Disease.    Anion gap 15 5 - 15    Comment: Performed at [facility], [place] [address], [place], [place] [address]  Magnesium     Status: Abnormal   Collection Time: 02/25/18  5:17 PM  Result Value Ref Range   Magnesium 1.3 (L) 1.7 - 2.4 mg/dL    Comment: Performed at [facility], [place] [address], [place], [place] [address]   No results found.  Pending Labs Unresulted Labs (From admission, onward)    Start     Ordered   02/25/18 1952  Culture, blood (x 2)  BLOOD CULTURE X 2,   STAT    Comments:  INITIATE ANTIBIOTICS WITHIN 1 HOUR AFTER BLOOD CULTURES DRAWN.  If unable to obtain blood cultures, call MD immediately regarding antibiotic instructions.    02/25/18 1951   02/25/18 1952  Lactic acid, plasma  STAT Now then every 3 hours,   STAT     02/25/18 1951   02/25/18 1952  Procalcitonin  STAT,   R     02/25/18 1951   02/25/18 1952  Protime-INR  STAT,   R     02/25/18 1951   02/25/18 1711  Urinalysis, Routine w reflex microscopic  Once,   R     02/25/18 1711   02/25/18 1711  Urine culture  STAT,   STAT     02/25/18 1711          Vitals/Pain Today's Vitals   02/25/18 1624 02/25/18 1625 02/25/18 1733 02/25/18 1900  BP:   (!) 177/85 (!) 188/85  Pulse:   (!) 105 (!) 109  Resp:   16 (!) 21  SpO2:   96% 96%  PainSc: 7  10-Worst pain ever      Isolation Precautions No active isolations  Medications Medications  sodium chloride 0.9 % bolus 1,000 mL (1,000 mLs Intravenous New Bag/Given 02/25/18 1939)  magnesium sulfate IVPB  2 g 50 mL (has no administration in time range)  potassium chloride 10 mEq in 100 mL IVPB (10 mEq  Intravenous New Bag/Given 02/25/18 1940)  0.9 %  sodium chloride infusion (has no administration in time range)  potassium chloride 20 MEQ/15ML (10%) solution 40 mEq (has no administration in time range)  acetaminophen (TYLENOL) tablet 650 mg (has no administration in time range)    Or  acetaminophen (TYLENOL) suppository 650 mg (has no administration in time range)  senna-docusate (Senokot-S) tablet 1 tablet (has no administration in time range)  hydrOXYzine (ATARAX/VISTARIL) tablet 10 mg (has no administration in time range)  zolpidem (AMBIEN) tablet 5 mg (has no administration in time range)  levothyroxine (SYNTHROID, LEVOTHROID) tablet 100 mcg (has no administration in time range)  pantoprazole (PROTONIX) EC tablet 40 mg (has no administration in time range)  sodium chloride 0.9 % bolus 1,000 mL (1,000 mLs Intravenous New Bag/Given 02/25/18 1738)  acetaminophen (TYLENOL) tablet 650 mg (650 mg Oral Given 02/25/18 1934)  ibuprofen (ADVIL,MOTRIN) tablet 600 mg (600 mg Oral Given 02/25/18 1934)  potassium chloride SA (K-DUR,KLOR-CON) CR tablet 40 mEq (40 mEq Oral Given 02/25/18 1933)    Mobility walks with device Gilford Rile)

## 2018-02-25 NOTE — ED Notes (Addendum)
Danielle [name] Son in law [phone]

## 2018-02-25 NOTE — ED Notes (Signed)
Bed: WA01 Expected date:  Expected time:  Means of arrival:  Comments: 84 yo fall w/ syncope

## 2018-02-26 DIAGNOSIS — I10 Essential (primary) hypertension: Secondary | ICD-10-CM | POA: Diagnosis not present

## 2018-02-26 DIAGNOSIS — R531 Weakness: Secondary | ICD-10-CM | POA: Diagnosis not present

## 2018-02-26 DIAGNOSIS — N179 Acute kidney failure, unspecified: Secondary | ICD-10-CM | POA: Diagnosis not present

## 2018-02-26 DIAGNOSIS — N390 Urinary tract infection, site not specified: Secondary | ICD-10-CM

## 2018-02-26 LAB — URINALYSIS, ROUTINE W REFLEX MICROSCOPIC
Bilirubin Urine: NEGATIVE
GLUCOSE, UA: NEGATIVE mg/dL
Ketones, ur: NEGATIVE mg/dL
NITRITE: NEGATIVE
PROTEIN: 30 mg/dL — AB
Specific Gravity, Urine: 1.009 (ref 1.005–1.030)
pH: 6 (ref 5.0–8.0)

## 2018-02-26 LAB — CBC
HEMATOCRIT: 25.8 % — AB (ref 36.0–46.0)
HEMOGLOBIN: 7.6 g/dL — AB (ref 12.0–15.0)
MCH: 19.6 pg — ABNORMAL LOW (ref 26.0–34.0)
MCHC: 29.5 g/dL — ABNORMAL LOW (ref 30.0–36.0)
MCV: 66.7 fL — AB (ref 78.0–100.0)
Platelets: 259 10*3/uL (ref 150–400)
RBC: 3.87 MIL/uL (ref 3.87–5.11)
RDW: 20.1 % — AB (ref 11.5–15.5)
WBC: 14.5 10*3/uL — ABNORMAL HIGH (ref 4.0–10.5)

## 2018-02-26 LAB — BASIC METABOLIC PANEL
Anion gap: 8 (ref 5–15)
BUN: 16 mg/dL (ref 8–23)
CHLORIDE: 105 mmol/L (ref 98–111)
CO2: 27 mmol/L (ref 22–32)
Calcium: 7.7 mg/dL — ABNORMAL LOW (ref 8.9–10.3)
Creatinine, Ser: 1 mg/dL (ref 0.44–1.00)
GFR calc Af Amer: 56 mL/min — ABNORMAL LOW (ref >60)
GFR calc non Af Amer: 48 mL/min — ABNORMAL LOW (ref >60)
GLUCOSE: 123 mg/dL — AB (ref 70–99)
POTASSIUM: 3.6 mmol/L (ref 3.5–5.1)
Sodium: 140 mmol/L (ref 135–145)

## 2018-02-26 LAB — LACTIC ACID, PLASMA: LACTIC ACID, VENOUS: 1.2 mmol/L (ref 0.5–1.9)

## 2018-02-26 LAB — MAGNESIUM: Magnesium: 1.7 mg/dL (ref 1.7–2.4)

## 2018-02-26 MED ORDER — CEPHALEXIN 500 MG PO CAPS: 500.0000 mg | ORAL_CAPSULE | Freq: Two times a day (BID) | ORAL | 0 refills | Status: AC

## 2018-02-26 MED ORDER — AMLODIPINE BESYLATE 5 MG PO TABS: 5.0000 mg | ORAL_TABLET | Freq: Every day | ORAL | 0 refills | Status: AC

## 2018-02-26 MED ORDER — SODIUM CHLORIDE 0.9 % IV SOLN
1.0000 g | Freq: Every day | INTRAVENOUS | Status: DC
  Administered 2018-02-26: 1 g via INTRAVENOUS
  Filled 2018-02-26: qty 1

## 2018-02-26 NOTE — Discharge Summary (Signed)
Physician Discharge Summary  Danielle [name] BZJ:[id number] DOB: November 05, 1925 DOA: 02/25/2018  PCP: Danielle Bill, MD  Admit date: 02/25/2018 Discharge date: 02/26/2018  Admitted From: Home.  Disposition:  HOME with hospice.   Recommendations for Outpatient Follow-up:  1. Follow up with PCP in 1-2 weeks 2. Please obtain BMP/CBC in one week Please follow up with hospice MD AS NEEDED.   Discharge Condition: guarded.  CODE STATUS: DNR.  Diet recommendation: Heart Healthy   Brief/Interim Summary: Danielle [name] is a 84 y.o. female with medical history significant of hospice care, hypertension, GERD, hypothyroidism, breast cancer, melanoma, GI bleeding due to gastric ulcer, who presents with generalized weakness, syncope and fall.  Discharge Diagnoses:  Principal Problem:   Generalized weakness Active Problems:   Essential hypertension   Malignant melanoma of overlapping sites [facility])   Malignant neoplasm of upper-inner quadrant of left breast in female, estrogen receptor positive (HCC)   Breast cancer (HCC)   Syncope   Hypokalemia   Hypomagnesemia   AKI (acute kidney injury) ([place])   Fall   Protein-calorie malnutrition, severe (HCC)   Leukocytosis   Microcytic anemia   UTI (urinary tract infection)   Generalized weakness:  Possibly from hypokalemia , malnutrition and dehydration.    Hypertension:  Sub optimal, added norvasc.    Hypokalemia and hypomagnesemia:  Replaced.    Syncope and fall:  - possibly orthostatic hypotension and dehydration. Able to move all extremities. She denies any imaging or CT head.  Hydrated. And she reports she feels good to g home.    Malignant melanoma of overlapping sites [facility]) and Malignant neoplasm of upper-inner quadrant of left breast in female, estrogen receptor positive [facility]): pt is followed by oncology, Dr. Jana [name].  Currently patient is not taking any medications. -Follow-up with Dr. Jana [name]   AKI;  Improved with fluids.    Severe protein calorie malnutriton   UTI:  She was started on rocephin and transitioned to keflex to complete the course.    Discharge Instructions   Allergies as of 02/26/2018      Reactions   Ativan [lorazepam]    Hydromorphone    Very small doses.   Morphine And Related    Causes Nausea & Vomiting   Vicodin [hydrocodone-acetaminophen] Nausea Only   Zofran [ondansetron Hcl] Nausea And Vomiting      Medication List    STOP taking these medications   anastrozole 1 MG tablet Commonly known as:  ARIMIDEX   cholecalciferol 1000 units tablet Commonly known as:  VITAMIN D   enalapril 10 MG tablet Commonly known as:  VASOTEC     TAKE these medications   acetaminophen 500 MG tablet Commonly known as:  TYLENOL Take 500 mg by mouth every 6 (six) hours as needed for moderate pain.   amLODipine 5 MG tablet Commonly known as:  NORVASC Take 1 tablet (5 mg total) by mouth daily. Start taking on:  02/27/2018   cephALEXin 500 MG capsule Commonly known as:  KEFLEX Take 1 capsule (500 mg total) by mouth 2 (two) times daily. Start taking on:  02/27/2018   ibuprofen 200 MG tablet Commonly known as:  ADVIL,MOTRIN Take 800 mg by mouth daily as needed for moderate pain.   levothyroxine 100 MCG tablet Commonly known as:  SYNTHROID, LEVOTHROID TAKE ONE TABLET BY MOUTH DAILY BEFORE BREAKFAST What changed:  See the new instructions.   pantoprazole 40 MG tablet Commonly known as:  PROTONIX Take 1 tablet (40 mg total) by mouth 2 (two) times daily.  Follow-up Information    Danielle Bill, MD. Schedule an appointment as soon as possible for a visit in 1 week(s).   Specialty:  Family Medicine Contact information: [place] [address] [phone]          Allergies  Allergen Reactions  . Ativan [Lorazepam]   . Hydromorphone     Very small doses.  . Morphine And Related     Causes Nausea & Vomiting  .  Vicodin [Hydrocodone-Acetaminophen] Nausea Only  . Zofran [Ondansetron Hcl] Nausea And Vomiting    Consultations:  None.    Procedures/Studies:  No results found.    Subjective: No complaints. Wants to go home.   Discharge Exam: Vitals:   02/26/18 0700 02/26/18 1344  BP:  (!) 178/80  Pulse:  94  Resp:  20  Temp:  98.2 F (36.8 C)  SpO2: 95% 92%   Vitals:   02/25/18 2311 02/26/18 0310 02/26/18 0700 02/26/18 1344  BP: (!) 180/82 (!) 165/72  (!) 178/80  Pulse:  93  94  Resp:    20  Temp:    98.2 F (36.8 C)  TempSrc:    Oral  SpO2:  90% 95% 92%  Weight:      Height:        General: Pt is alert, awake, not in acute distress Cardiovascular: RRR, S1/S2 +, no rubs, no gallops Respiratory: CTA bilaterally, no wheezing, no rhonchi Abdominal: Soft, NT, ND, bowel sounds + Extremities: no edema, no cyanosis    The results of significant diagnostics from this hospitalization (including imaging, microbiology, ancillary and laboratory) are listed below for reference.     Microbiology: Recent Results (from the past 240 hour(s))  Culture, blood (x 2)     Status: None (Preliminary result)   Collection Time: 02/25/18  9:52 PM  Result Value Ref Range Status   Specimen Description   Final    BLOOD RIGHT HAND Performed at [facility] [address] [place], [place] [address]    Special Requests   Final    BOTTLES DRAWN AEROBIC AND ANAEROBIC Blood Culture adequate volume Performed at [facility] [address] [place], [place] [address]    Culture   Final    NO GROWTH < 12 HOURS Performed at [facility] [address] [place], [place] [address]    Report Status PENDING  Incomplete  Culture, blood (x 2)     Status: None (Preliminary result)   Collection Time: 02/25/18  9:52 PM  Result Value Ref Range Status   Specimen Description   Final    BLOOD LEFT ANTECUBITAL Performed at [facility] [address] [place], [place] [address]    Special Requests   Final    BOTTLES DRAWN AEROBIC AND ANAEROBIC Blood Culture adequate volume Performed at [employer] [address], [place], [place] [address]    Culture   Final    NO GROWTH < 12 HOURS Performed at [facility] [address], [place], [place] [address]    Report Status PENDING  Incomplete     Labs: BNP (last 3 results) No results for input(s): BNP in the last 8760 hours. Basic Metabolic Panel: Recent Labs  Lab 02/25/18 1717 02/26/18 0621  NA 142 140  K 2.5* 3.6  CL 98 105  CO2 29 27  GLUCOSE 148* 123*  BUN 18 16  CREATININE 1.02* 1.00  CALCIUM 8.4* 7.7*  MG 1.3* 1.7   Liver Function Tests: Recent Labs  Lab 02/25/18 1717  AST 28  ALT 21  ALKPHOS 84  BILITOT 0.9  PROT 6.1*  ALBUMIN 2.9*   No results for input(s): LIPASE, AMYLASE in the last 168 hours. No results for input(s): AMMONIA in the last 168 hours. CBC: Recent Labs  Lab 02/25/18 1717 02/26/18 0621  WBC 21.7* 14.5*  NEUTROABS 20.9*  --   HGB 7.8* 7.6*  HCT 26.7* 25.8*  MCV 66.4* 66.7*  PLT 293 259   Cardiac Enzymes: No results for input(s): CKTOTAL, CKMB, CKMBINDEX, TROPONINI in the last 168 hours. BNP: Invalid input(s): POCBNP CBG: No results for input(s): GLUCAP in the last 168 hours. D-Dimer No results for input(s): DDIMER in the last 72 hours. Hgb A1c No results for input(s): HGBA1C in the last 72 hours. Lipid Profile No results for input(s): CHOL, HDL, LDLCALC, TRIG, CHOLHDL, LDLDIRECT in the last 72 hours. Thyroid function studies No results for input(s): TSH, T4TOTAL, T3FREE, THYROIDAB in the last 72 hours.  Invalid input(s): FREET3 Anemia work up No results for input(s): VITAMINB12, FOLATE, FERRITIN, TIBC, IRON, RETICCTPCT in the last 72 hours. Urinalysis    Component Value Date/Time   COLORURINE YELLOW 02/26/2018 0425   APPEARANCEUR HAZY (A) 02/26/2018 0425   LABSPEC 1.009 02/26/2018  0425   PHURINE 6.0 02/26/2018 0425   GLUCOSEU NEGATIVE 02/26/2018 0425   HGBUR MODERATE (A) 02/26/2018 0425   BILIRUBINUR NEGATIVE 02/26/2018 0425   KETONESUR NEGATIVE 02/26/2018 0425   PROTEINUR 30 (A) 02/26/2018 0425   NITRITE NEGATIVE 02/26/2018 0425   LEUKOCYTESUR SMALL (A) 02/26/2018 0425   Sepsis Labs Invalid input(s): PROCALCITONIN,  WBC,  LACTICIDVEN Microbiology Recent Results (from the past 240 hour(s))  Culture, blood (x 2)     Status: None (Preliminary result)   Collection Time: 02/25/18  9:52 PM  Result Value Ref Range Status   Specimen Description   Final    BLOOD RIGHT HAND Performed at [facility], [place] [address], [place], [place] [address]    Special Requests   Final    BOTTLES DRAWN AEROBIC AND ANAEROBIC Blood Culture adequate volume Performed at [facility] [address], [place], [place] [address]    Culture   Final    NO GROWTH < 12 HOURS Performed at [facility] [address], [place], [place] [address]    Report Status PENDING  Incomplete  Culture, blood (x 2)     Status: None (Preliminary result)   Collection Time: 02/25/18  9:52 PM  Result Value Ref Range Status   Specimen Description   Final    BLOOD LEFT ANTECUBITAL Performed at [facility] [address] [place], [place] [address]    Special Requests   Final    BOTTLES DRAWN AEROBIC AND ANAEROBIC Blood Culture adequate volume Performed at [facility] [address], [place], [place] [address]    Culture   Final    NO GROWTH < 12 HOURS Performed at [facility] [address] [place], [place] [address]    Report Status PENDING  Incomplete     Time coordinating discharge: 32  minutes  SIGNED:   Hosie Poisson, MD  [facility] 02/26/2018, 2:03 PM Pager   If 7PM-7AM, please contact night-coverage [email or web address] Password [email or web address]

## 2018-02-26 NOTE — Progress Notes (Signed)
OT Cancellation Note  Patient Details Name: Danielle [name] MRN: [id number] DOB: Jan 15, 1926   Cancelled Treatment:    Reason Eval/Treat Not Completed: Other (comment). Received page text from PT earlier that pt is declining therapy services while here. We will sign off due to this and pt now has D/C orders for today.  Almon Register [id number] 02/26/2018, 4:05 PM

## 2018-02-26 NOTE — Progress Notes (Signed)
Spoke w [facility], patient will return to home via private car, will resume services w [facility], no other CM needs identified.

## 2018-02-26 NOTE — Progress Notes (Signed)
Pt discharged to home. DC instructions given with daughter at bedside. No concerns voiced. Pt and dtr encouraged to stop by pharmacy and pick up meds that were e-prescribed to pharmacy. Both individuals voiced understanding. Pt left unit in wheelchair pushed by nurse tech. Left in stable condition. Hale Bogus.

## 2018-02-26 NOTE — Progress Notes (Signed)
[facility] ([facility]) GIP RN Visit at 1140 am  This is a related and covered GIP admission of 02/25/18 at 2103 with [facility] diagnosis of Malignant Melanoma of skin per Dr. Karie Georges, [facility] physician. Patient has an [place] DNR. Pt's neighbor found patient lying on the floor in her home, pt asked neighbor to call EMS. Neighbor notified [facility] and was instructed to call 911 as patient stated she felt like she was dying and requesting to go to the hospital.   Patient was admitted to the hospital for Generalized Weakness. Likely multifactorial etiology, including electrolyte disturbance, AKI, dehydration and malnutrition. UA result is consistent with UTI.  [facility] Day 2 of GIP.  Pt was found to have K+ of 2.5 and Mg of 1.3 on admission. K+ 10 mEq IV x 2 doses and Mg 2g IV were administered. NS bolus of 2L received and continues at 75 cc/hr. Rocephin 1 g IV daily, pt has received 1 dose 02/26/18. Pt is currently on 2 lpm [place].  Goals of Care: Met with patient at the bedside. She tells me she is feeling fine and is going home today, her daughter is currently en route. She reports that she is a "little angry with God today, as he should have let her go". She does not want to be a burden to her family. [name] states she is going to go home with her daughter [name] for a few days and then desires to return to her home to live independently.  Discharge Planning: Phone call to Dr. Karleen Hampshire to confirm plan for discharge today. Pt was not on O2 at home, order received to wean O2 and follow saturations. Vanita Ingles, bedside RN notified. When daughter arrives, to page Dr. Karleen Hampshire to discuss plan for discharge.  Communication with [facility]: awaiting daughter [name] arrival.  Communication with [facility]: per clinical note. Notified Dr. Virgie Dad office of patient admission.  Transfer summary and current [facility] medication list placed on shadow chart.  Should patient need ambulance transportation at discharge, please call  [facility] at [phone] as [facility] transports [facility] patients.  Please call with any hospice related questions or concerns.  Thank you, Margaretmary Eddy, RN, [facility] Liaison [phone]  [place] are on AMION.

## 2018-02-26 NOTE — Progress Notes (Signed)
PT Cancellation Note  Patient Details Name: Danielle [name] MRN: [id number] DOB: Nov 10, 1925   Cancelled Treatment:    Reason Eval/Treat Not Completed: Patient declined. Pt is not interested in therapy services at this time.  She states her daughter from [place] is coming to get her later this AM. She states she lives alone, but that may change.  Will check back tomorrow in case pt is not discharged.  Santiago Glad L. Tamala Julian, [place] Pager [phone] 02/26/2018    Galen Manila 02/26/2018, 10:08 AM

## 2018-02-28 LAB — URINE CULTURE

## 2018-03-02 LAB — CULTURE, BLOOD (ROUTINE X 2)
Culture: NO GROWTH
Culture: NO GROWTH
Special Requests: ADEQUATE
Special Requests: ADEQUATE

## 2018-05-01 DEATH — deceased

## 2019-02-14 IMAGING — CT NM PET IMAGE RESTAGE (PS) WHOLE BODY
7 of 8 series · 23 of 25 positions shown · non-contrast
Comparison: 04/26/2017

CLINICAL DATA: Subsequent treatment strategy for metastatic
melanoma. History of left breast cancer..

EXAM:
NUCLEAR MEDICINE PET WHOLE BODY
TECHNIQUE: 5.8 mCi F-18 FDG was injected intravenously. Full-ring PET imaging
was performed from the skull base to thigh after the radiotracer. CT
data was obtained and used for attenuation correction and anatomic
localization.
Fasting blood glucose: 107 mg/dl

[Series 3: pet wb ac · axial · 5.0mm · 4.07mm/px · z∈[+330,+2002]mm · 5 of 419 slices shown]
[im 1/419]
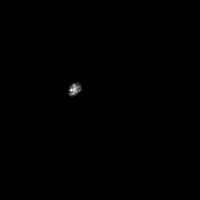
[im 105/419]
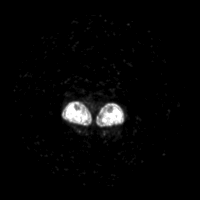
[im 210/419]
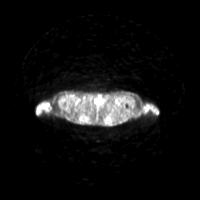
[im 314/419]
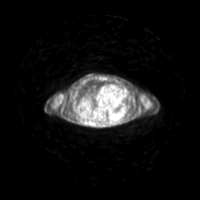
[im 419/419]
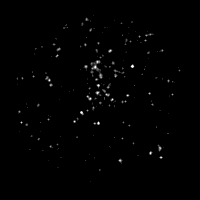

[Series 4: ct wb 5.0 b31f · axial · 5.0mm · 0.98mm/px · z∈[+330,+2002]mm · 4 of 419 slices shown]
[im 1/419]
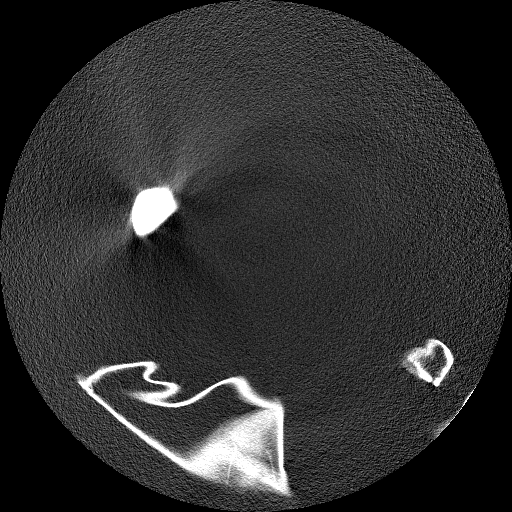
[im 210/419  soft-tissue]
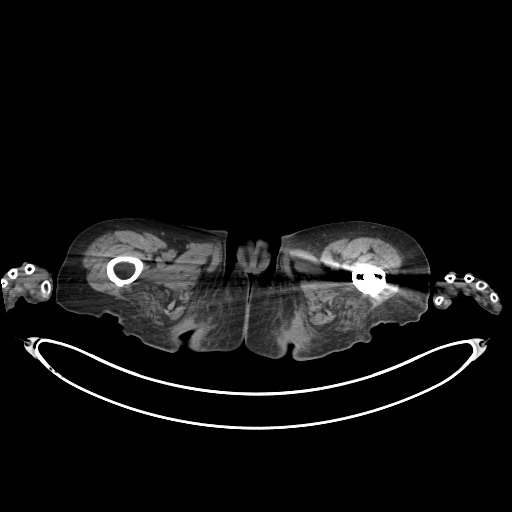
[im 314/419  soft-tissue]
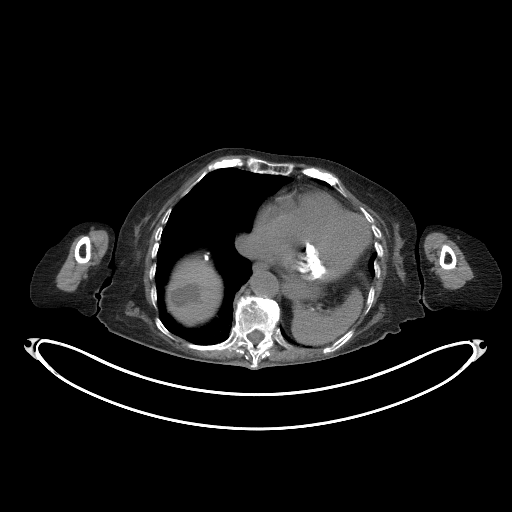
[im 419/419  soft-tissue]
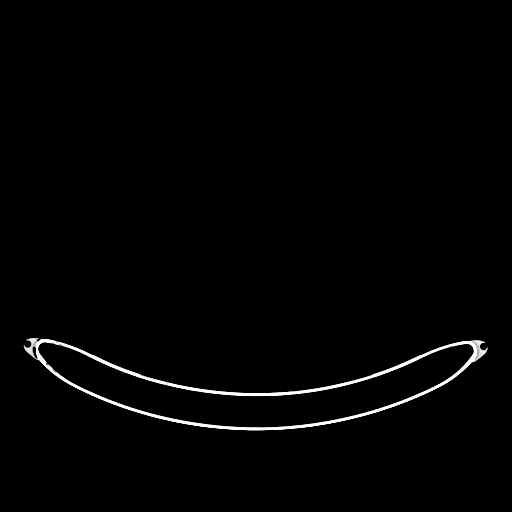

[Series 5: pet wb nac · axial · 5.0mm · 4.07mm/px · z∈[+330,+2002]mm · 6 of 419 slices shown]
[im 1/419]
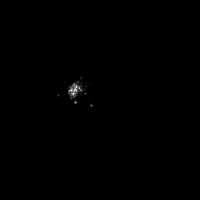
[im 84/419]
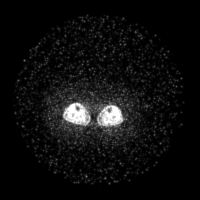
[im 168/419]
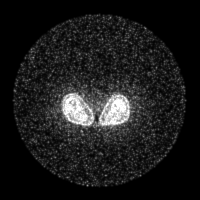
[im 251/419]
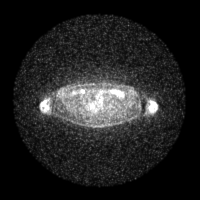
[im 335/419]
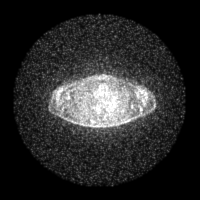
[im 419/419]
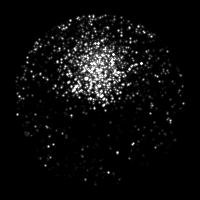

[Series 8: ct wb 5.0 b70f (id)_bone · axial · 5.0mm · 0.67mm/px · 1 of 73 slices shown]
[im 1/73  soft-tissue]
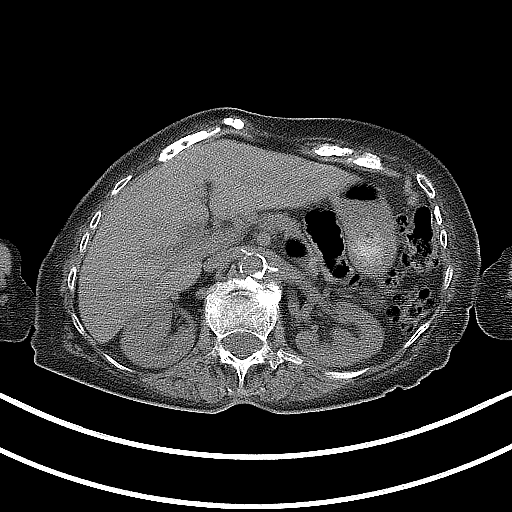

[Series 603: mip range 3 · coronal · 3.46mm/px · 1 of 32 slices shown]
[im 1/32]
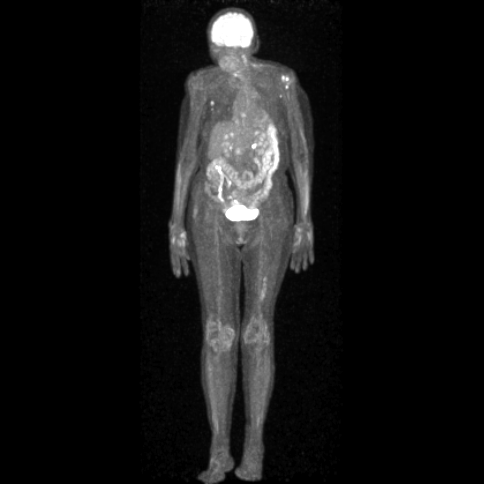

[Series 605: range-ct wb 5.0 (id)<alpha range> · 5 of 403 slices shown]
[im 1/403]
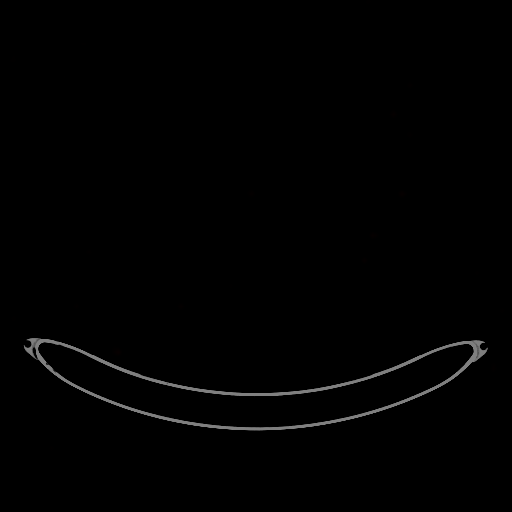
[im 101/403]
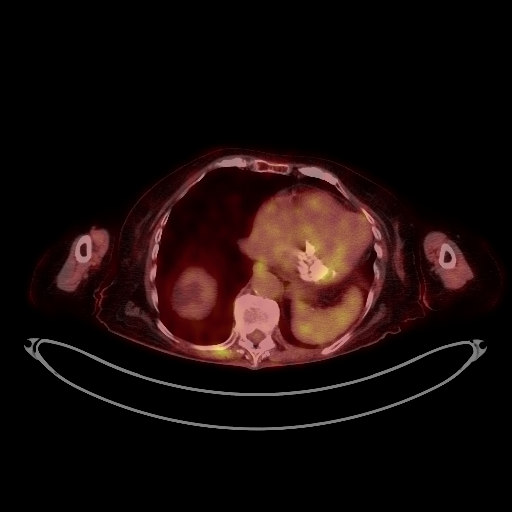
[im 202/403]
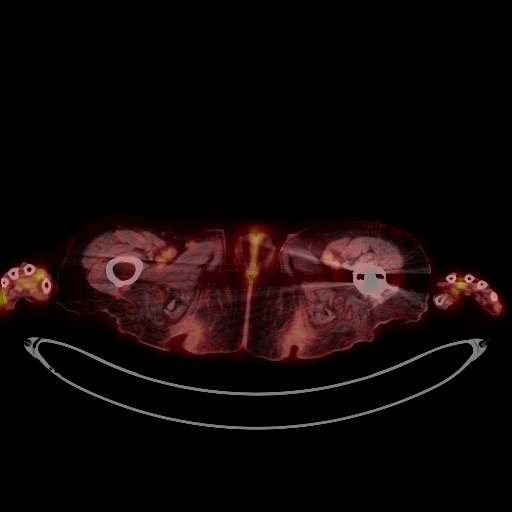
[im 302/403]
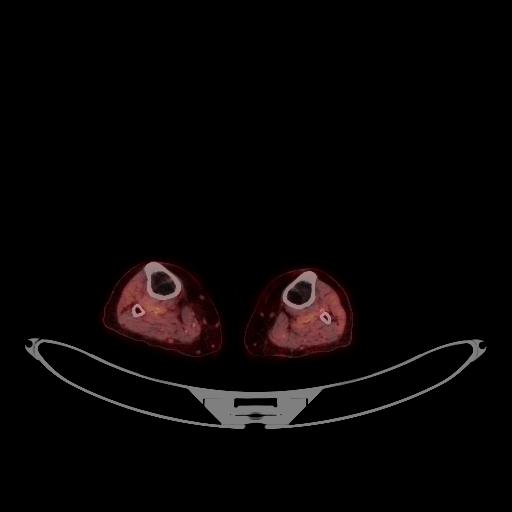
[im 403/403]
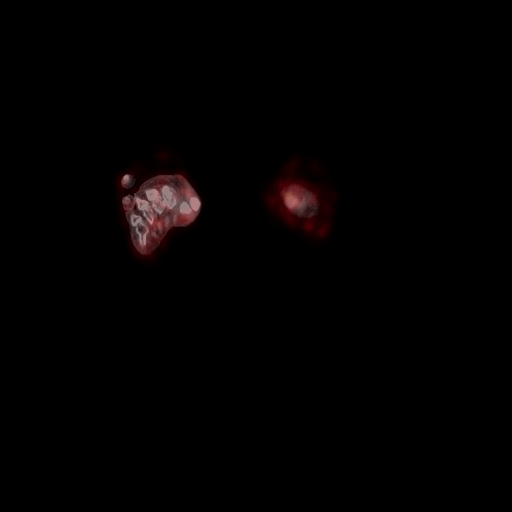

[Series 1655: results mm oncology reading · 1.0mm · 0.51mm/px · 1 of 6 slices shown]
[im 1/6]
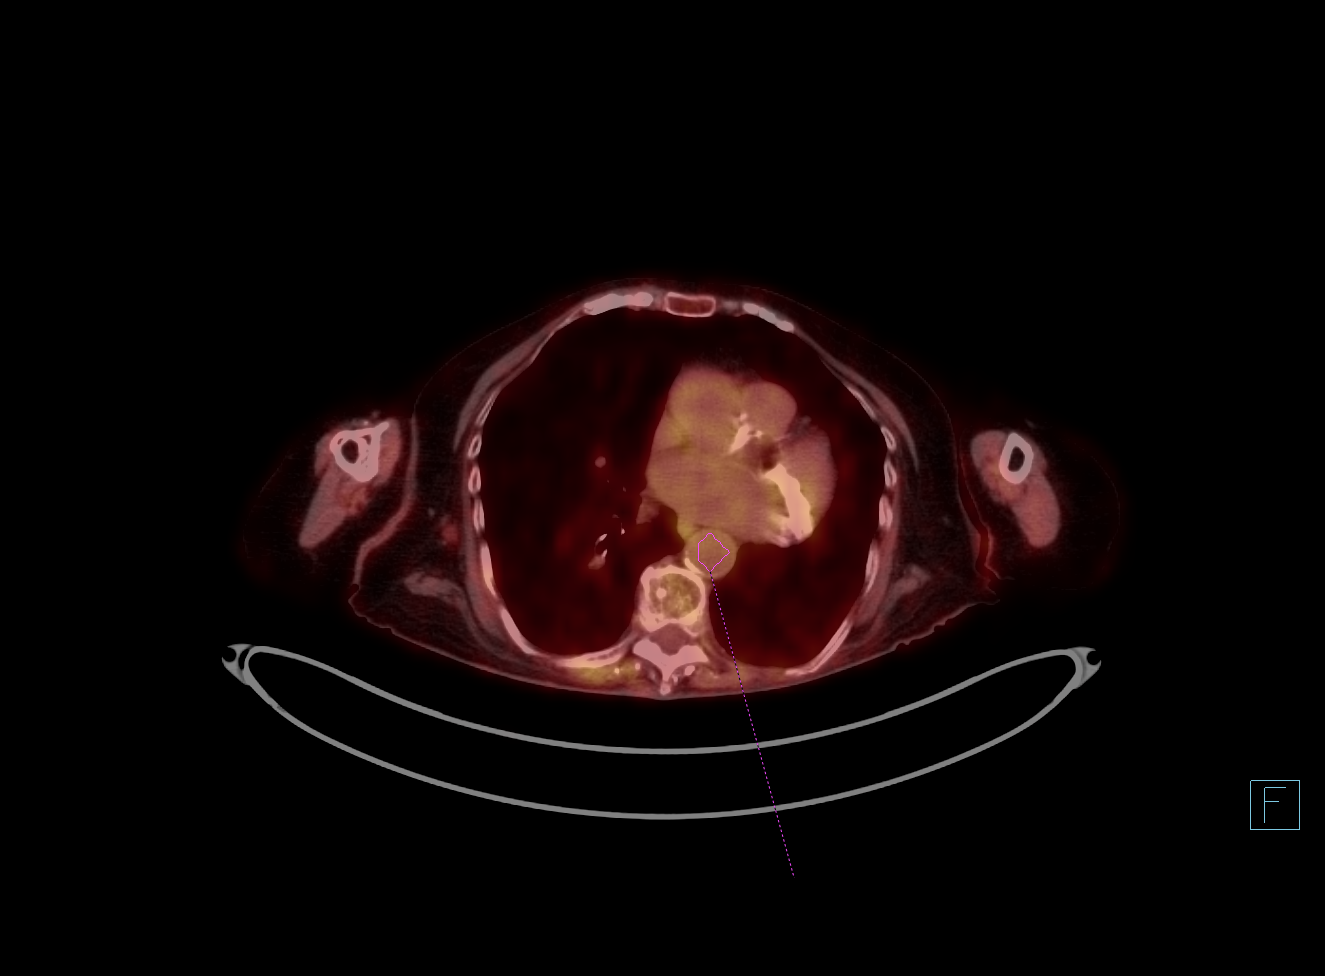

[23 of 25 positions shown; findings below may reference images not displayed]

FINDINGS: Mediastinal blood pool activity: SUV max

HEAD/NECK: No hypermetabolic activity in the scalp. No
hypermetabolic cervical lymph nodes.

Incidental CT findings: none

CHEST: Similar appearance small clustered lymph nodes posterior left
axilla showing hypermetabolism as before. 1.3 cm short axis
posterior right axillary lymph node measured previously is 1.0 cm
short axis today. SUV max = 5.1 today compared to 3.6 previously.

Previously described left medial breast nodule is stable on CT
imaging with SUV max = 1.9 today compared to 2.1 previously.

Branching right lower lobe nodule (image 43/series 8) is similar in
size measuring 8 x 9 mm today compared to 9 x 10 mm previously. This
again shows low level FDG uptake with SUV max = 1.7 compared to
previously.

Incidental CT findings: Coronary artery calcification is evident.
Atherosclerotic calcification is noted in the wall of the thoracic
aorta. Dense mitral annular calcification evident.

ABDOMEN/PELVIS: No abnormal hypermetabolic activity within the
liver, pancreas, adrenal glands, or spleen. No hypermetabolic lymph
nodes in the abdomen or pelvis.

Incidental CT findings: Homogeneous low-density central liver lesion
is photopenic on PET imaging compatible with a cyst. Large duodenal
diverticulum noted. Tiny layering calcified gallstones evident.
Renal cysts are evident including a stable hyperattenuating lesion
in the left kidney likely complex cyst. There is abdominal aortic
atherosclerosis without aneurysm. Diverticular changes noted in the
left colon without diverticulitis.

SKELETON: Hypermetabolic foci again noted in the left humeral head
and proximal left humerus. Index lesion in the proximal humerus
demonstrates SUV max = 8.2 today compared to 10.0 previously.
Hypermetabolism in the marrow of the left femoral diaphysis has
decreased with SUV max = 3.8 compared to 4.9 previously.

Incidental CT findings: Patient is noted be status post left total
hip replacement.

EXTREMITIES: No abnormal hypermetabolic activity in the lower
extremities.

Incidental CT findings: none
IMPRESSION: 1. No substantial change in exam. The index hypermetabolic osseous
lesions in the proximal left humerus and left femur show mild
decrease in hypermetabolism.
2. Index posterior right axillary lymph node measures smaller on CT
imaging but shows slight increase in hypermetabolism. Posterior left
axillary nodes and left breast lesion are similar.
3.  Aortic Atherosclerois (ASDZA-170.0)
4. Cholelithiasis
